# Patient Record
Sex: Male | Born: 1960
Health system: Southern US, Community
[De-identification: ages and names within clinical notes are randomized; demographics above are authoritative.]

## PROBLEM LIST (undated history)

## (undated) DIAGNOSIS — R519 Headache, unspecified: Secondary | ICD-10-CM

## (undated) DIAGNOSIS — T7840XA Allergy, unspecified, initial encounter: Secondary | ICD-10-CM

## (undated) DIAGNOSIS — R319 Hematuria, unspecified: Secondary | ICD-10-CM

## (undated) DIAGNOSIS — M4800 Spinal stenosis, site unspecified: Secondary | ICD-10-CM

## (undated) DIAGNOSIS — F411 Generalized anxiety disorder: Secondary | ICD-10-CM

## (undated) DIAGNOSIS — Z87442 Personal history of urinary calculi: Secondary | ICD-10-CM

## (undated) DIAGNOSIS — N400 Enlarged prostate without lower urinary tract symptoms: Secondary | ICD-10-CM

## (undated) DIAGNOSIS — I10 Essential (primary) hypertension: Secondary | ICD-10-CM

## (undated) DIAGNOSIS — J45909 Unspecified asthma, uncomplicated: Secondary | ICD-10-CM

## (undated) DIAGNOSIS — E785 Hyperlipidemia, unspecified: Secondary | ICD-10-CM

## (undated) DIAGNOSIS — I499 Cardiac arrhythmia, unspecified: Secondary | ICD-10-CM

## (undated) DIAGNOSIS — G43909 Migraine, unspecified, not intractable, without status migrainosus: Secondary | ICD-10-CM

## (undated) DIAGNOSIS — G709 Myoneural disorder, unspecified: Secondary | ICD-10-CM

## (undated) DIAGNOSIS — N401 Enlarged prostate with lower urinary tract symptoms: Secondary | ICD-10-CM

## (undated) DIAGNOSIS — K219 Gastro-esophageal reflux disease without esophagitis: Secondary | ICD-10-CM

## (undated) DIAGNOSIS — J309 Allergic rhinitis, unspecified: Secondary | ICD-10-CM

## (undated) DIAGNOSIS — M1990 Unspecified osteoarthritis, unspecified site: Secondary | ICD-10-CM

## (undated) DIAGNOSIS — U071 COVID-19: Secondary | ICD-10-CM

## (undated) DIAGNOSIS — F419 Anxiety disorder, unspecified: Secondary | ICD-10-CM

## (undated) DIAGNOSIS — R51 Headache: Secondary | ICD-10-CM

## (undated) DIAGNOSIS — M797 Fibromyalgia: Secondary | ICD-10-CM

## (undated) DIAGNOSIS — R351 Nocturia: Secondary | ICD-10-CM

## (undated) HISTORY — DX: Spinal stenosis, site unspecified: M48.00

## (undated) HISTORY — DX: Allergic rhinitis, unspecified: J30.9

## (undated) HISTORY — PX: COLONOSCOPY: SHX174

## (undated) HISTORY — PX: CYSTOSCOPY WITH INSERTION OF UROLIFT: SHX6678

## (undated) HISTORY — PX: SPINE SURGERY: SHX786

## (undated) HISTORY — DX: Generalized anxiety disorder: F41.1

## (undated) HISTORY — DX: Unspecified asthma, uncomplicated: J45.909

## (undated) HISTORY — DX: Cardiac arrhythmia, unspecified: I49.9

## (undated) HISTORY — DX: Migraine, unspecified, not intractable, without status migrainosus: G43.909

## (undated) HISTORY — DX: Allergy, unspecified, initial encounter: T78.40XA

## (undated) HISTORY — PX: BACK SURGERY: SHX140

## (undated) HISTORY — PX: LUMBAR LAMINECTOMY: SHX95

---
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
## 1998-04-19 HISTORY — PX: HERNIA REPAIR: SHX51

## 1998-04-19 HISTORY — PX: APPENDECTOMY: SHX54

## 2003-04-20 HISTORY — PX: OTHER SURGICAL HISTORY: SHX169

## 2004-07-20 ENCOUNTER — Ambulatory Visit: Payer: Self-pay

## 2008-01-15 ENCOUNTER — Ambulatory Visit: Payer: Self-pay | Admitting: Family Medicine

## 2008-01-26 ENCOUNTER — Ambulatory Visit: Payer: Self-pay | Admitting: Urology

## 2008-01-30 ENCOUNTER — Ambulatory Visit: Payer: Self-pay | Admitting: Urology

## 2008-03-13 ENCOUNTER — Ambulatory Visit: Payer: Self-pay | Admitting: Unknown Physician Specialty

## 2016-02-05 ENCOUNTER — Emergency Department
Admission: EM | Admit: 2016-02-05 | Discharge: 2016-02-05 | Disposition: A | Discharge status: Home / Self Care | Payer: BLUE CROSS/BLUE SHIELD | Attending: Emergency Medicine | Admitting: Emergency Medicine

## 2016-02-05 DIAGNOSIS — N390 Urinary tract infection, site not specified: Secondary | ICD-10-CM | POA: Diagnosis not present

## 2016-02-05 DIAGNOSIS — R300 Dysuria: Secondary | ICD-10-CM | POA: Diagnosis present

## 2016-02-05 DIAGNOSIS — Z79899 Other long term (current) drug therapy: Secondary | ICD-10-CM | POA: Diagnosis not present

## 2016-02-05 DIAGNOSIS — R339 Retention of urine, unspecified: Secondary | ICD-10-CM

## 2016-02-05 DIAGNOSIS — N3090 Cystitis, unspecified without hematuria: Secondary | ICD-10-CM

## 2016-02-05 LAB — CBC WITH DIFFERENTIAL/PLATELET
BASOS ABS: 0 10*3/uL (ref 0–0.1)
Basophils Relative: 0 %
EOS PCT: 0 %
Eosinophils Absolute: 0 10*3/uL (ref 0–0.7)
HCT: 41.6 % (ref 40.0–52.0)
Hemoglobin: 14.7 g/dL (ref 13.0–18.0)
LYMPHS PCT: 5 %
Lymphs Abs: 0.6 10*3/uL — ABNORMAL LOW (ref 1.0–3.6)
MCH: 32.2 pg (ref 26.0–34.0)
MCHC: 35.3 g/dL (ref 32.0–36.0)
MCV: 91 fL (ref 80.0–100.0)
MONO ABS: 0.8 10*3/uL (ref 0.2–1.0)
Monocytes Relative: 7 %
Neutro Abs: 11.1 10*3/uL — ABNORMAL HIGH (ref 1.4–6.5)
Neutrophils Relative %: 88 %
PLATELETS: 205 10*3/uL (ref 150–440)
RBC: 4.57 MIL/uL (ref 4.40–5.90)
RDW: 13.8 % (ref 11.5–14.5)
WBC: 12.6 10*3/uL — ABNORMAL HIGH (ref 3.8–10.6)

## 2016-02-05 LAB — BASIC METABOLIC PANEL
ANION GAP: 10 (ref 5–15)
BUN: 11 mg/dL (ref 6–20)
CALCIUM: 9.7 mg/dL (ref 8.9–10.3)
CO2: 24 mmol/L (ref 22–32)
Chloride: 99 mmol/L — ABNORMAL LOW (ref 101–111)
Creatinine, Ser: 0.87 mg/dL (ref 0.61–1.24)
GFR calc Af Amer: >60 mL/min (ref >60)
GLUCOSE: 129 mg/dL — AB (ref 65–99)
Potassium: 3.8 mmol/L (ref 3.5–5.1)
Sodium: 133 mmol/L — ABNORMAL LOW (ref 135–145)

## 2016-02-05 LAB — URINALYSIS COMPLETE WITH MICROSCOPIC (ARMC ONLY)
Bilirubin Urine: NEGATIVE
Glucose, UA: NEGATIVE mg/dL
Hgb urine dipstick: NEGATIVE
Leukocytes, UA: NEGATIVE
Nitrite: POSITIVE — AB
PH: 7 (ref 5.0–8.0)
PROTEIN: 30 mg/dL — AB
SQUAMOUS EPITHELIAL / LPF: NONE SEEN
Specific Gravity, Urine: 1.011 (ref 1.005–1.030)

## 2016-02-05 MED ORDER — TAMSULOSIN HCL 0.4 MG PO CAPS: 0.4000 mg | ORAL_CAPSULE | Freq: Every day | ORAL | 0 refills | Status: DC

## 2016-02-05 MED ORDER — CIPROFLOXACIN HCL 500 MG PO TABS
500.0000 mg | ORAL_TABLET | Freq: Once | ORAL | Status: AC
  Administered 2016-02-05: 500 mg via ORAL
  Filled 2016-02-05: qty 1

## 2016-02-05 MED ORDER — DIAZEPAM 5 MG/ML IJ SOLN
INTRAMUSCULAR | Status: AC
  Administered 2016-02-05: 2 mg via INTRAVENOUS
  Filled 2016-02-05: qty 2

## 2016-02-05 MED ORDER — DIAZEPAM 5 MG/ML IJ SOLN
2.0000 mg | Freq: Once | INTRAMUSCULAR | Status: AC
  Administered 2016-02-05: 2 mg via INTRAVENOUS

## 2016-02-05 MED ORDER — FENTANYL CITRATE (PF) 100 MCG/2ML IJ SOLN
50.0000 ug | INTRAMUSCULAR | Status: DC | PRN
  Administered 2016-02-05: 50 ug via INTRAVENOUS
  Filled 2016-02-05: qty 2

## 2016-02-05 MED ORDER — CIPROFLOXACIN HCL 500 MG PO TABS: 500.0000 mg | ORAL_TABLET | Freq: Two times a day (BID) | ORAL | 0 refills | Status: AC

## 2016-02-05 MED ORDER — HYOSCYAMINE SULFATE 0.125 MG SL SUBL: 0.1250 mg | SUBLINGUAL_TABLET | SUBLINGUAL | 0 refills | Status: DC | PRN

## 2016-02-05 NOTE — ED Provider Notes (Signed)
[facility] Emergency Department Provider Note   ____________________________________________   First MD Initiated Contact with Patient 02/05/16 0701     (approximate)  I have reviewed the triage vital signs and the nursing notes.   HISTORY  Chief Complaint Dysuria   HPI Juan Salas is a 55 y.o. male with a history of an enlarged prostate who is presenting to the emergency department today with urinary retention. He says that he was seen this past Tuesday at an urgent care and diagnosed with urinary tract infection. He was placed on Macrobid as well as Pyridium. He was having burning with urination at that time but over the past day has begun to have urinary retention and says he has not urinated over the past 4-5 hours. He says that yesterday he was having to push very hard to urinate. He says that over the past several hours he has had lower abdominal distention as well as lower abdominal pain. He denies any back pain or rectal pain. Says that he has had prostatitis in the past, years ago.   No past medical history on file.  There are no active problems to display for this patient.   No past surgical history on file.  Prior to Admission medications   Medication Sig Start Date End Date Taking? Authorizing Provider  nitrofurantoin, macrocrystal-monohydrate, (MACROBID) 100 MG capsule Take 100 mg by mouth 2 (two) times daily.    Historical Provider, MD    Allergies Review of patient's allergies indicates no known allergies.  No family history on file.  Social History Social History  Substance Use Topics  . Smoking status: Not on file  . Smokeless tobacco: Not on file  . Alcohol use Not on file    Review of Systems Constitutional: chills Eyes: No visual changes. ENT: No sore throat. Cardiovascular: Denies chest pain. Respiratory: Denies shortness of breath. Gastrointestinal:  No nausea, no vomiting.  no constipation. Genitourinary:  As above Musculoskeletal: Negative for back pain. Skin: Negative for rash. Neurological: Negative for headaches, focal weakness or numbness.  10-point ROS otherwise negative.  ____________________________________________   PHYSICAL EXAM:  VITAL SIGNS: ED Triage Vitals  Enc Vitals Group     BP 02/05/16 0646 (!) 161/98     Pulse Rate 02/05/16 0646 97     Resp 02/05/16 0646 20     Temp 02/05/16 0646 98.2 F (36.8 C)     Temp Source 02/05/16 0646 Oral     SpO2 02/05/16 0646 100 %     Weight 02/05/16 0645 185 lb (83.9 kg)     Height 02/05/16 0645 6\' 1"  (1.854 m)     Head Circumference --      Peak Flow --      Pain Score 02/05/16 0645 10     Pain Loc --      Pain Edu? --      Excl. in [place]? --     Constitutional: Alert and oriented. Well appearing and in no acute distress. Eyes: Conjunctivae are normal. PERRL. EOMI. Head: Atraumatic. Nose: No congestion/rhinnorhea. Mouth/Throat: Mucous membranes are moist.   Neck: No stridor.   Cardiovascular: Normal rate, regular rhythm. Grossly normal heart sounds.   Respiratory: Normal respiratory effort.  No retractions. Lungs CTAB. Gastrointestinal: Soft With mild lower abdominal distention as well as tenderness across the lower abdomen without any focal tenderness to palpation. No CVA tenderness. Rectal exam with a firm and rubbery prostate. No masses palpated. No bogginess or fluctuance. Not tender  to palpation. Musculoskeletal: No lower extremity tenderness nor edema.  No joint effusions. Neurologic:  Normal speech and language. No gross focal neurologic deficits are appreciated.  Skin:  Skin is warm, dry and intact. No rash noted. Psychiatric: Mood and affect are normal. Speech and behavior are normal.  ____________________________________________   LABS (all labs ordered are listed, but only abnormal results are displayed)  Labs Reviewed  CBC WITH DIFFERENTIAL/PLATELET - Abnormal; Notable for the following:       Result Value     WBC 12.6 (*)    Neutro Abs 11.1 (*)    Lymphs Abs 0.6 (*)    All other components within normal limits  BASIC METABOLIC PANEL - Abnormal; Notable for the following:    Sodium 133 (*)    Chloride 99 (*)    Glucose, Bld 129 (*)    All other components within normal limits  URINALYSIS COMPLETEWITH MICROSCOPIC ([facility] ONLY) - Abnormal; Notable for the following:    Color, Urine AMBER (*)    APPearance CLEAR (*)    Ketones, ur 1+ (*)    Protein, ur 30 (*)    Nitrite POSITIVE (*)    Bacteria, UA RARE (*)    All other components within normal limits  URINE CULTURE   ____________________________________________  EKG   ____________________________________________  RADIOLOGY   ____________________________________________   PROCEDURES  Procedure(s) performed:   Procedures  Critical Care performed:   ____________________________________________   INITIAL IMPRESSION / ASSESSMENT AND PLAN / ED COURSE  Pertinent labs & imaging results that were available during my care of the patient were reviewed by me and considered in my medical decision making (see chart for details).    Clinical Course   ----------------------------------------- 9:57 AM on 02/05/2016 -----------------------------------------  Patient with intermittent bladder spasms at this time after Foley placement. Nearly 1 L has drained. Urinalysis is nitrite positive which wasn't on his initial sample at the urgent care. I discussed the case with Dr. Roni Bread of urology who recommends changing antibiotics to Cipro as well as adding Flomax and hyoscyamine.  The patient will remain with a Foley in place. He'll be following up with [facility]. I discussed this plan with patient as well as wife are understanding of this plan went to comply. He will be discharged home. He is also requesting a work note.  ____________________________________________   FINAL CLINICAL IMPRESSION(S) / ED DIAGNOSES  Urinary  retention. UTI with cystitis.    NEW MEDICATIONS STARTED DURING THIS VISIT:  New Prescriptions   No medications on file     Note:  This document was prepared using Dragon voice recognition software and may include unintentional dictation errors.    Orbie Pyo, MD 02/05/16 [phone]

## 2016-02-05 NOTE — ED Triage Notes (Addendum)
Pt co dysuria since Tuesday went to urgent care and dx with cystitis. Has been put on antibiotics and pyridium without improvement. States unable to void since yest.

## 2016-02-05 NOTE — ED Notes (Signed)
Discharge teaching with foley done. Pt and wife verbalized understanding.

## 2016-02-06 LAB — URINE CULTURE

## 2016-02-10 ENCOUNTER — Encounter: Payer: Self-pay | Admitting: Urology

## 2016-02-10 ENCOUNTER — Ambulatory Visit (INDEPENDENT_AMBULATORY_CARE_PROVIDER_SITE_OTHER): Payer: BLUE CROSS/BLUE SHIELD | Admitting: Urology

## 2016-02-10 VITALS — BP 123/85 | HR 87 | Ht 73.0 in | Wt 178.3 lb

## 2016-02-10 DIAGNOSIS — N401 Enlarged prostate with lower urinary tract symptoms: Secondary | ICD-10-CM

## 2016-02-10 DIAGNOSIS — R339 Retention of urine, unspecified: Secondary | ICD-10-CM | POA: Diagnosis not present

## 2016-02-10 MED ORDER — TAMSULOSIN HCL 0.4 MG PO CAPS: 0.4000 mg | ORAL_CAPSULE | Freq: Every day | ORAL | 11 refills | Status: DC

## 2016-02-10 NOTE — Progress Notes (Signed)
02/10/2016 9:29 AM   Juan Salas [birth year] [id number]  Referring provider: No referring provider defined for this encounter.  Chief Complaint  Patient presents with  . New Patient (Initial Visit)    urinary retention     HPI: Juan Salas is a 55yo with a hx of BPH here for evaluation of urinary retention. On 10/19 he presented to [facility] ER with urinary retention and had a foley placed. He was started on flomax at that time.  2 days prior he was seen at an urgent care and diagnosed with UTI and placed on macrobid. Prior to his UTI he had severe LUTS and was not on BPH therapy. He denies constipation.  He previously saw Dr. Jonathon Jordan for BPH and npehrolithiasis.  He denies hematuria. No exacerbating/alleviating events. NO other associated symptoms     PMH: No past medical history on file.  Surgical History: No past surgical history on file.  Home Medications:    Medication List       Accurate as of 02/10/16  9:29 AM. Always use your most recent med list.          acetaminophen 500 MG tablet Commonly known as:  TYLENOL Take 500 mg by mouth every 6 (six) hours as needed.   ciprofloxacin 500 MG tablet Commonly known as:  CIPRO Take 1 tablet (500 mg total) by mouth 2 (two) times daily.   hyoscyamine 0.125 MG SL tablet Commonly known as:  LEVSIN SL Place 1 tablet (0.125 mg total) under the tongue every 4 (four) hours as needed for cramping. Do not take in the 12 hours prior to your urology appointment.   naproxen sodium 220 MG tablet Commonly known as:  ANAPROX Take 220 mg by mouth 2 (two) times daily with a meal.   nitrofurantoin (macrocrystal-monohydrate) 100 MG capsule Commonly known as:  MACROBID Take 100 mg by mouth 2 (two) times daily.   tamsulosin 0.4 MG Caps capsule Commonly known as:  FLOMAX Take 1 capsule (0.4 mg total) by mouth daily.       Allergies: No Known Allergies  Family History: No family history on file.  Social History:   reports that he has never smoked. He has never used smokeless tobacco. He reports that he does not drink alcohol or use drugs.  ROS: UROLOGY Frequent Urination?: Yes Hard to postpone urination?: Yes Burning/pain with urination?: Yes Get up at night to urinate?: Yes Leakage of urine?: Yes Urine stream starts and stops?: Yes Trouble starting stream?: Yes Do you have to strain to urinate?: Yes Blood in urine?: No Urinary tract infection?: Yes Sexually transmitted disease?: No Injury to kidneys or bladder?: No Painful intercourse?: Yes Weak stream?: No Erection problems?: No Penile pain?: No  Gastrointestinal Nausea?: No Vomiting?: No Indigestion/heartburn?: No Diarrhea?: No Constipation?: No  Constitutional Fever: No Night sweats?: No Weight loss?: No Fatigue?: Yes  Skin Skin rash/lesions?: No Itching?: No  Eyes Blurred vision?: No Double vision?: No  Ears/Nose/Throat Sore throat?: No Sinus problems?: Yes  Hematologic/Lymphatic Swollen glands?: No Easy bruising?: No  Cardiovascular Leg swelling?: No Chest pain?: No  Respiratory Cough?: No Shortness of breath?: No  Endocrine Excessive thirst?: No  Musculoskeletal Back pain?: Yes Joint pain?: No  Neurological Headaches?: No Dizziness?: Yes  Psychologic Depression?: No Anxiety?: No  Physical Exam: BP 123/85   Pulse 87   Ht 6\' 1"  (1.854 m)   Wt 80.9 kg (178 lb 4.8 oz)   BMI 23.52 kg/m   Constitutional:  Alert  and oriented, No acute distress. HEENT: [place] AT, moist mucus membranes.  Trachea midline, no masses. Cardiovascular: No clubbing, cyanosis, or edema. Respiratory: Normal respiratory effort, no increased work of breathing. GI: Abdomen is soft, nontender, nondistended, no abdominal masses GU: No CVA tenderness. circumcised phallus. No masses/lesions on penis/testes/scrotum. 16 french foley in place. Prostate 60g smooth, no nodules, no induration Skin: No rashes, bruises or suspicious  lesions. Lymph: No cervical or inguinal adenopathy. Neurologic: Grossly intact, no focal deficits, moving all 4 extremities. Psychiatric: Normal mood and affect.  Laboratory Data: Lab Results  Component Value Date   WBC 12.6 (H) 02/05/2016   HGB 14.7 02/05/2016   HCT 41.6 02/05/2016   MCV 91.0 02/05/2016   PLT 205 02/05/2016    Lab Results  Component Value Date   CREATININE 0.87 02/05/2016    No results found for: PSA  No results found for: TESTOSTERONE  No results found for: HGBA1C  Urinalysis    Component Value Date/Time   COLORURINE AMBER (A) 02/05/2016 0829   APPEARANCEUR CLEAR (A) 02/05/2016 0829   LABSPEC 1.011 02/05/2016 0829   PHURINE 7.0 02/05/2016 0829   GLUCOSEU NEGATIVE 02/05/2016 0829   HGBUR NEGATIVE 02/05/2016 0829   BILIRUBINUR NEGATIVE 02/05/2016 0829   KETONESUR 1+ (A) 02/05/2016 0829   PROTEINUR 30 (A) 02/05/2016 0829   NITRITE POSITIVE (A) 02/05/2016 0829   LEUKOCYTESUR NEGATIVE 02/05/2016 0829    Pertinent Imaging: none  Assessment & Plan:    1. BPH with LUTS, urinary retention -Continue flomax 0.4mg   -voiding trial passed today  There are no diagnoses linked to this encounter.  No Follow-up on file.  Nicolette Bang, MD  [facility] [address] [place], [place] [address] [phone]

## 2016-02-27 ENCOUNTER — Telehealth: Payer: Self-pay

## 2016-02-27 ENCOUNTER — Telehealth: Payer: Self-pay | Admitting: Urology

## 2016-02-27 DIAGNOSIS — N401 Enlarged prostate with lower urinary tract symptoms: Secondary | ICD-10-CM

## 2016-02-27 MED ORDER — TAMSULOSIN HCL 0.4 MG PO CAPS: 0.4000 mg | ORAL_CAPSULE | Freq: Every day | ORAL | 11 refills | Status: DC

## 2016-02-27 NOTE — Telephone Encounter (Signed)
The pt called complaining of urinary frequency, burning that started after he finished his abx. He stated he was urinating every 35-40 minutes. Dr. Alyson Ingles recommended that the pt takes AZO for the discomfort and for him to come in on Monday for a nurse visit to do a  PVR to rule-out urinary retention. He also stated if the patient symptoms worsen he needs to go to the ER. The pt notified and appt scheduled for Monday morning @ 8:30am.

## 2016-02-27 NOTE — Telephone Encounter (Signed)
Patient said that he has taken all of his medication for the infection but now his symptoms have returned. He is burning with urination and heat sensation in the bladder area and frequent urination . He has a follow up with dr. Alyson Ingles on the 21st. He wants to know what he should do?   Sharyn Lull

## 2016-02-27 NOTE — Telephone Encounter (Signed)
The pt called requesting that we send his script to his pharmacy.

## 2016-02-27 NOTE — Telephone Encounter (Signed)
Patient also states that he was supposed to get a script for flomax which I see was printed but the patient says it was not given to him. He said he needs that called into [facility] on [address]  [phone]  He can be reached at this number

## 2016-03-01 ENCOUNTER — Ambulatory Visit (INDEPENDENT_AMBULATORY_CARE_PROVIDER_SITE_OTHER): Payer: BLUE CROSS/BLUE SHIELD

## 2016-03-01 VITALS — BP 174/101 | HR 69 | Ht 73.0 in | Wt 183.4 lb

## 2016-03-01 DIAGNOSIS — R339 Retention of urine, unspecified: Secondary | ICD-10-CM

## 2016-03-01 LAB — MICROSCOPIC EXAMINATION: BACTERIA UA: NONE SEEN

## 2016-03-01 LAB — URINALYSIS, COMPLETE
Bilirubin, UA: NEGATIVE
GLUCOSE, UA: NEGATIVE
KETONES UA: NEGATIVE
NITRITE UA: NEGATIVE
PROTEIN UA: NEGATIVE
RBC, UA: NEGATIVE
SPEC GRAV UA: 1.015 (ref 1.005–1.030)
UUROB: 0.2 mg/dL (ref 0.2–1.0)
pH, UA: 7 (ref 5.0–7.5)

## 2016-03-01 LAB — BLADDER SCAN AMB NON-IMAGING: SCAN RESULT: 40

## 2016-03-01 NOTE — Progress Notes (Signed)
Pt presented today for u/a, ucx, and PVR. Pt stated that he has been burning and having a constant feeling of being full. Pt stated he is currently taking AZO and has a f/u appt next week. Reinforced with pt to keep f/u appt. Clean catch was sent for u/a and cx. PVR-40.   Blood pressure (!) 174/101, pulse 69, height 6\' 1"  (1.854 m), weight 183 lb 6.4 oz (83.2 kg).

## 2016-03-01 NOTE — Addendum Note (Signed)
Addended by: Toniann Fail C on: 03/01/2016 09:12 AM   Modules accepted: Orders

## 2016-03-03 LAB — CULTURE, URINE COMPREHENSIVE

## 2016-03-09 ENCOUNTER — Ambulatory Visit: Payer: BLUE CROSS/BLUE SHIELD | Admitting: Urology

## 2016-03-09 VITALS — Ht 73.0 in | Wt 183.2 lb

## 2016-03-09 DIAGNOSIS — N401 Enlarged prostate with lower urinary tract symptoms: Secondary | ICD-10-CM | POA: Diagnosis not present

## 2016-03-09 DIAGNOSIS — R351 Nocturia: Secondary | ICD-10-CM | POA: Diagnosis not present

## 2016-03-09 NOTE — Progress Notes (Signed)
03/09/2016 2:44 PM   Juan Salas 08-Jan-1961 [id number]  Referring provider: No referring provider defined for this encounter.  Chief Complaint  Patient presents with  . Follow-up    BPH    HPI: Juan Salas is a 55yo with a hx of BPH here for evaluation of urinary retention. On 10/19 he presented to [facility] ER with urinary retention and had a foley placed. He was started on flomax at that time.  2 days prior he was seen at an urgent care and diagnosed with UTI and placed on macrobid. Prior to his UTI he had severe LUTS and was not on BPH therapy. He denies constipation.  He previously saw Dr. Jonathon Salas for BPH and npehrolithiasis.  He denies hematuria. No exacerbating/alleviating events. NO other associated symptoms    Interval hx: He is currently on flomax with resolution of the majority of his LUTS. Nocturia 1x on occasion. 2 weeks ago he had an episode of dysuria and the concern for not emptying. PVR was 42cc at that time. PVR was 0. He is very happy with his LUTS.  He was also having pain with ejaculation prior to starting flomax which has since resolved.    PMH: No past medical history on file.  Surgical History: No past surgical history on file.  Home Medications:    Medication List       Accurate as of 03/09/16  2:44 PM. Always use your most recent med list.          acetaminophen 500 MG tablet Commonly known as:  TYLENOL Take 500 mg by mouth every 6 (six) hours as needed.   guaiFENesin 600 MG 12 hr tablet Commonly known as:  MUCINEX Take 600 mg by mouth 2 (two) times daily.   hyoscyamine 0.125 MG SL tablet Commonly known as:  LEVSIN SL Place 1 tablet (0.125 mg total) under the tongue every 4 (four) hours as needed for cramping. Do not take in the 12 hours prior to your urology appointment.   naproxen sodium 220 MG tablet Commonly known as:  ANAPROX Take 220 mg by mouth 2 (two) times daily with a meal.   tamsulosin 0.4 MG Caps capsule Commonly  known as:  FLOMAX Take 1 capsule (0.4 mg total) by mouth daily.       Allergies: No Known Allergies  Family History: No family history on file.  Social History:  reports that he has never smoked. He has never used smokeless tobacco. He reports that he does not drink alcohol or use drugs.  ROS:                                        Physical Exam: Ht 6\' 1"  (1.854 m)   Wt 83.1 kg (183 lb 3.2 oz)   BMI 24.17 kg/m   Constitutional:  Alert and oriented, No acute distress. HEENT: [place] AT, moist mucus membranes.  Trachea midline, no masses. Cardiovascular: No clubbing, cyanosis, or edema. Respiratory: Normal respiratory effort, no increased work of breathing. GI: Abdomen is soft, nontender, nondistended, no abdominal masses GU: No CVA tenderness.  Skin: No rashes, bruises or suspicious lesions. Lymph: No cervical or inguinal adenopathy. Neurologic: Grossly intact, no focal deficits, moving all 4 extremities. Psychiatric: Normal mood and affect.  Laboratory Data: Lab Results  Component Value Date   WBC 12.6 (H) 02/05/2016   HGB 14.7 02/05/2016   HCT 41.6  02/05/2016   MCV 91.0 02/05/2016   PLT 205 02/05/2016    Lab Results  Component Value Date   CREATININE 0.87 02/05/2016    No results found for: PSA  No results found for: TESTOSTERONE  No results found for: HGBA1C  Urinalysis    Component Value Date/Time   COLORURINE AMBER (A) 02/05/2016 0829   APPEARANCEUR Clear 03/01/2016 0910   LABSPEC 1.011 02/05/2016 0829   PHURINE 7.0 02/05/2016 0829   GLUCOSEU Negative 03/01/2016 0910   HGBUR NEGATIVE 02/05/2016 0829   BILIRUBINUR Negative 03/01/2016 0910   KETONESUR 1+ (A) 02/05/2016 0829   PROTEINUR Negative 03/01/2016 0910   PROTEINUR 30 (A) 02/05/2016 0829   NITRITE Negative 03/01/2016 0910   NITRITE POSITIVE (A) 02/05/2016 0829   LEUKOCYTESUR Trace (A) 03/01/2016 0910    Pertinent Imaging: none  Assessment & Plan:    1. Benign  prostatic hyperplasia with lower urinary tract symptoms, symptom details unspecified -continue flomax 0.4mg  daily - Bladder Scan (Post Void Residual) in office   No Follow-up on file.  Nicolette Bang, MD  [facility] [address] [place], [place] [address] [phone]

## 2016-05-03 ENCOUNTER — Telehealth: Payer: Self-pay | Admitting: Urology

## 2016-05-03 DIAGNOSIS — N401 Enlarged prostate with lower urinary tract symptoms: Secondary | ICD-10-CM

## 2016-05-03 MED ORDER — TAMSULOSIN HCL 0.4 MG PO CAPS: 0.4000 mg | ORAL_CAPSULE | Freq: Every day | ORAL | 4 refills | Status: DC

## 2016-05-03 NOTE — Telephone Encounter (Signed)
Pt states he would like to have Rx (flomax) sent to another pharmacy.  Please advise.

## 2016-06-11 ENCOUNTER — Ambulatory Visit: Payer: BLUE CROSS/BLUE SHIELD

## 2017-08-25 ENCOUNTER — Telehealth: Payer: Self-pay | Admitting: Radiology

## 2017-08-25 DIAGNOSIS — N401 Enlarged prostate with lower urinary tract symptoms: Secondary | ICD-10-CM

## 2017-08-25 MED ORDER — TAMSULOSIN HCL 0.4 MG PO CAPS: 0.4000 mg | ORAL_CAPSULE | Freq: Every day | ORAL | 0 refills | Status: DC

## 2017-08-25 NOTE — Telephone Encounter (Signed)
Pt needs refill of tamsulosin to last until next appt scheduled 08/30/2017. Ran out 1 week ago & feels he is developing prostatitis.

## 2017-08-25 NOTE — Telephone Encounter (Signed)
RX sent

## 2017-08-30 ENCOUNTER — Ambulatory Visit: Payer: BLUE CROSS/BLUE SHIELD | Admitting: Urology

## 2017-08-30 ENCOUNTER — Encounter: Payer: Self-pay | Admitting: Urology

## 2017-08-30 VITALS — BP 165/96 | HR 78 | Resp 16 | Ht 73.0 in | Wt 190.4 lb

## 2017-08-30 DIAGNOSIS — N401 Enlarged prostate with lower urinary tract symptoms: Secondary | ICD-10-CM | POA: Diagnosis not present

## 2017-08-30 LAB — URINALYSIS, COMPLETE
BILIRUBIN UA: NEGATIVE
Glucose, UA: NEGATIVE
Ketones, UA: NEGATIVE
LEUKOCYTES UA: NEGATIVE
Nitrite, UA: NEGATIVE
PH UA: 7 (ref 5.0–7.5)
Protein, UA: NEGATIVE
RBC UA: NEGATIVE
Specific Gravity, UA: 1.02 (ref 1.005–1.030)
UUROB: 0.2 mg/dL (ref 0.2–1.0)

## 2017-08-30 LAB — MICROSCOPIC EXAMINATION
EPITHELIAL CELLS (NON RENAL): NONE SEEN /HPF (ref 0–10)
RBC MICROSCOPIC, UA: NONE SEEN /HPF (ref 0–2)
WBC UA: NONE SEEN /HPF (ref 0–5)

## 2017-08-30 LAB — BLADDER SCAN AMB NON-IMAGING: SCAN RESULT: 0

## 2017-08-31 ENCOUNTER — Encounter: Payer: Self-pay | Admitting: Urology

## 2017-08-31 MED ORDER — TAMSULOSIN HCL 0.4 MG PO CAPS: 0.4000 mg | ORAL_CAPSULE | Freq: Every day | ORAL | 3 refills | Status: DC

## 2017-08-31 NOTE — Progress Notes (Signed)
08/30/2017 7:41 AM   Arty Baumgartner Manfredo May 23, 1960 [id number]  Referring provider: No referring provider defined for this encounter.  Chief Complaint  Patient presents with  . Follow-up   Urologic problem list: -BPH with lower urinary tract symptoms -Previous episode of urinary retention  HPI: 57 year old male presents for follow-up/medication refill.  He last saw Dr. Alyson Ingles and November 2017.  He was treated for urinary retention and a UTI and October 2019.  He had a history of severe lower urinary tract symptoms not on medical management and was started on tamsulosin.  At his last visit PVR by bladder scan was 42 mL.  He states he has been doing well.  He recently ran out of tamsulosin and noted increased hesitancy and decreased urinary stream however once refilled the symptoms have resolved.  He is currently satisfied with his voiding pattern.   PMH: History reviewed. No pertinent past medical history.  Surgical History: History reviewed. No pertinent surgical history.  Home Medications:  Allergies as of 08/30/2017   No Known Allergies     Medication List        Accurate as of 08/30/17 11:59 PM. Always use your most recent med list.          acetaminophen 500 MG tablet Commonly known as:  TYLENOL Take 500 mg by mouth every 6 (six) hours as needed.   guaiFENesin 600 MG 12 hr tablet Commonly known as:  MUCINEX Take 600 mg by mouth 2 (two) times daily.   naproxen sodium 220 MG tablet Commonly known as:  ALEVE Take 220 mg by mouth 2 (two) times daily with a meal.   tamsulosin 0.4 MG Caps capsule Commonly known as:  FLOMAX Take 1 capsule (0.4 mg total) by mouth daily.       Allergies: No Known Allergies  Family History: History reviewed. No pertinent family history.  Social History:  reports that he has never smoked. He has never used smokeless tobacco. He reports that he does not drink alcohol or use drugs.  ROS: UROLOGY Frequent Urination?:  Yes Hard to postpone urination?: No Burning/pain with urination?: No Get up at night to urinate?: No Leakage of urine?: No Urine stream starts and stops?: No Trouble starting stream?: No Do you have to strain to urinate?: No Blood in urine?: No Urinary tract infection?: No Sexually transmitted disease?: No Injury to kidneys or bladder?: No Painful intercourse?: No Weak stream?: No Erection problems?: No Penile pain?: No  Gastrointestinal Nausea?: No Vomiting?: No Indigestion/heartburn?: No Diarrhea?: No Constipation?: No  Constitutional Fever: No Night sweats?: No Weight loss?: No Fatigue?: No  Skin Skin rash/lesions?: No Itching?: No  Eyes Blurred vision?: No Double vision?: No  Ears/Nose/Throat Sore throat?: No Sinus problems?: No  Hematologic/Lymphatic Swollen glands?: No Easy bruising?: No  Cardiovascular Leg swelling?: No Chest pain?: No  Respiratory Cough?: No Shortness of breath?: No  Endocrine Excessive thirst?: No  Musculoskeletal Back pain?: No Joint pain?: No  Neurological Headaches?: No Dizziness?: No  Psychologic Depression?: No Anxiety?: No  Physical Exam: BP (!) 165/96   Pulse 78   Resp 16   Ht 6\' 1"  (1.854 m)   Wt 190 lb 6.4 oz (86.4 kg)   SpO2 98%   BMI 25.12 kg/m   Constitutional:  Alert and oriented, No acute distress. HEENT: [place] AT, moist mucus membranes.  Trachea midline, no masses. Cardiovascular: No clubbing, cyanosis, or edema. Respiratory: Normal respiratory effort, no increased work of breathing. GI: Abdomen is soft, nontender, nondistended,  no abdominal masses GU: No CVA tenderness Lymph: No cervical or inguinal lymphadenopathy. Skin: No rashes, bruises or suspicious lesions. Neurologic: Grossly intact, no focal deficits, moving all 4 extremities. Psychiatric: Normal mood and affect.  Laboratory Data: Lab Results  Component Value Date   WBC 12.6 (H) 02/05/2016   HGB 14.7 02/05/2016   HCT 41.6  02/05/2016   MCV 91.0 02/05/2016   PLT 205 02/05/2016    Lab Results  Component Value Date   CREATININE 0.87 02/05/2016     Assessment & Plan:    1. Benign prostatic hyperplasia with lower urinary tract symptoms, symptom details unspecified 57 year old male with stable lower urinary tract symptoms on tamsulosin.  PVR by bladder scan today was 0 mL.  Tamsulosin was refilled.  I discussed PSA screening and AUA recommendations.  He declined PSA testing at this time.  Continue annual follow-up.   Abbie Sons, [place] [address], [place], [place] [address] [phone]

## 2017-12-18 DIAGNOSIS — N401 Enlarged prostate with lower urinary tract symptoms: Secondary | ICD-10-CM | POA: Insufficient documentation

## 2017-12-18 DIAGNOSIS — I10 Essential (primary) hypertension: Secondary | ICD-10-CM

## 2017-12-18 DIAGNOSIS — N400 Enlarged prostate without lower urinary tract symptoms: Secondary | ICD-10-CM

## 2017-12-18 DIAGNOSIS — R319 Hematuria, unspecified: Secondary | ICD-10-CM

## 2017-12-18 DIAGNOSIS — M1990 Unspecified osteoarthritis, unspecified site: Secondary | ICD-10-CM

## 2017-12-18 DIAGNOSIS — R351 Nocturia: Secondary | ICD-10-CM

## 2017-12-18 HISTORY — DX: Hematuria, unspecified: R31.9

## 2017-12-18 HISTORY — DX: Benign prostatic hyperplasia without lower urinary tract symptoms: N40.0

## 2017-12-18 HISTORY — DX: Nocturia: R35.1

## 2017-12-18 HISTORY — DX: Essential (primary) hypertension: I10

## 2017-12-18 HISTORY — DX: Unspecified osteoarthritis, unspecified site: M19.90

## 2017-12-18 HISTORY — DX: Benign prostatic hyperplasia with lower urinary tract symptoms: N40.1

## 2018-01-09 ENCOUNTER — Ambulatory Visit (INDEPENDENT_AMBULATORY_CARE_PROVIDER_SITE_OTHER): Payer: BLUE CROSS/BLUE SHIELD | Admitting: Urology

## 2018-01-09 ENCOUNTER — Encounter: Payer: Self-pay | Admitting: Urology

## 2018-01-09 VITALS — BP 196/113 | HR 88 | Ht 73.0 in | Wt 189.4 lb

## 2018-01-09 DIAGNOSIS — N401 Enlarged prostate with lower urinary tract symptoms: Secondary | ICD-10-CM | POA: Diagnosis not present

## 2018-01-09 DIAGNOSIS — R339 Retention of urine, unspecified: Secondary | ICD-10-CM | POA: Diagnosis not present

## 2018-01-09 DIAGNOSIS — R351 Nocturia: Secondary | ICD-10-CM

## 2018-01-09 LAB — BLADDER SCAN AMB NON-IMAGING

## 2018-01-09 NOTE — H&P (View-Only) (Signed)
01/09/2018 2:48 PM   Arty Baumgartner Sofranko Jun 21, 1960 [id number]  Referring provider: No referring provider defined for this encounter.  Chief Complaint  Patient presents with  . Benign Prostatic Hypertrophy    HPI: 57 year old male followed for BPH presents today to discuss UroLift. I last saw him May 2019.  PVR at that visit was 48 mL.  He has intermittent periods of worsening lower urinary tract symptoms and for the past 2 weeks has been complaining of sensation of incomplete emptying, frequency, intermittency, weak stream, straining to urinate and nocturia x2.  IPSS completed today was 29/35 with a quality of life rated 6/6.  He denies dysuria or gross hematuria.  He denies flank, abdominal, pelvic or scrotal pain.  PMH: History reviewed. No pertinent past medical history.  Surgical History: History reviewed. No pertinent surgical history.  Home Medications:  Allergies as of 01/09/2018   No Known Allergies     Medication List        Accurate as of 01/09/18  2:48 PM. Always use your most recent med list.          acetaminophen 500 MG tablet Commonly known as:  TYLENOL Take 500 mg by mouth every 6 (six) hours as needed.   guaiFENesin 600 MG 12 hr tablet Commonly known as:  MUCINEX Take 600 mg by mouth 2 (two) times daily.   montelukast 10 MG tablet Commonly known as:  SINGULAIR Take 10 mg by mouth every evening.   naproxen sodium 220 MG tablet Commonly known as:  ALEVE Take 220 mg by mouth 2 (two) times daily with a meal.   tamsulosin 0.4 MG Caps capsule Commonly known as:  FLOMAX Take 1 capsule (0.4 mg total) by mouth daily.       Allergies: No Known Allergies  Family History: History reviewed. No pertinent family history.  Social History:  reports that he has never smoked. He has never used smokeless tobacco. He reports that he does not drink alcohol or use drugs.  ROS: UROLOGY Frequent Urination?: Yes Hard to postpone urination?:  No Burning/pain with urination?: Yes Get up at night to urinate?: Yes Leakage of urine?: Yes Urine stream starts and stops?: Yes Trouble starting stream?: Yes Do you have to strain to urinate?: Yes Blood in urine?: No Urinary tract infection?: No Sexually transmitted disease?: No Injury to kidneys or bladder?: No Painful intercourse?: Yes Weak stream?: Yes Erection problems?: No Penile pain?: No  Gastrointestinal Nausea?: Yes Vomiting?: No Indigestion/heartburn?: No Diarrhea?: No Constipation?: No  Constitutional Fever: No Night sweats?: No Weight loss?: No Fatigue?: Yes  Skin Skin rash/lesions?: No Itching?: No  Eyes Blurred vision?: No Double vision?: No  Ears/Nose/Throat Sore throat?: No Sinus problems?: Yes  Hematologic/Lymphatic Swollen glands?: No Easy bruising?: No  Cardiovascular Leg swelling?: No Chest pain?: No  Respiratory Cough?: No Shortness of breath?: No  Endocrine Excessive thirst?: No  Musculoskeletal Back pain?: Yes Joint pain?: No  Neurological Headaches?: No Dizziness?: No  Psychologic Depression?: No Anxiety?: No  Physical Exam: BP (!) 196/113 (BP Location: Left Arm, Patient Position: Sitting, Cuff Size: Large)   Pulse 88   Ht 6\' 1"  (1.854 m)   Wt 189 lb 6.4 oz (85.9 kg)   BMI 24.99 kg/m   Constitutional:  Alert and oriented, No acute distress. HEENT: [place] AT, moist mucus membranes.  Trachea midline, no masses. Cardiovascular: No clubbing, cyanosis, or edema. Respiratory: Normal respiratory effort, no increased work of breathing. GI: Abdomen is soft, nontender, nondistended, no abdominal masses  GU: No CVA tenderness.  Prostate 50 g, smooth without nodules Lymph: No cervical or inguinal lymphadenopathy. Skin: No rashes, bruises or suspicious lesions. Neurologic: Grossly intact, no focal deficits, moving all 4 extremities. Psychiatric: Normal mood and affect.   Assessment & Plan:   57 year old male with BPH and  lower urinary tract symptoms which are periodically worse at times.  Urinalysis was ordered.  PVR by bladder scan today was 148 mL.  We discussed the UroLift procedure in detail including the most common side effects of urinary frequency, urgency, dysuria and intermittent hematuria.  The low risks of sexual side effects were discussed.  The possibility of persistent lower urinary tract symptoms was also discussed.  He would like to proceed with further evaluation and will schedule cystoscopy/TRUS to see if he is a candidate for the procedure.  He will need a PSA however will await on his UA results prior to ordering.   Abbie Sons, [place] [address], [place], [place] [address] [phone]

## 2018-01-09 NOTE — Progress Notes (Signed)
01/09/2018 2:48 PM   Juan Salas 10-29-1960 [id number]  Referring provider: No referring provider defined for this encounter.  Chief Complaint  Patient presents with  . Benign Prostatic Hypertrophy    HPI: 57 year old male followed for BPH presents today to discuss UroLift. I last saw him May 2019.  PVR at that visit was 48 mL.  He has intermittent periods of worsening lower urinary tract symptoms and for the past 2 weeks has been complaining of sensation of incomplete emptying, frequency, intermittency, weak stream, straining to urinate and nocturia x2.  IPSS completed today was 29/35 with a quality of life rated 6/6.  He denies dysuria or gross hematuria.  He denies flank, abdominal, pelvic or scrotal pain.  PMH: History reviewed. No pertinent past medical history.  Surgical History: History reviewed. No pertinent surgical history.  Home Medications:  Allergies as of 01/09/2018   No Known Allergies     Medication List        Accurate as of 01/09/18  2:48 PM. Always use your most recent med list.          acetaminophen 500 MG tablet Commonly known as:  TYLENOL Take 500 mg by mouth every 6 (six) hours as needed.   guaiFENesin 600 MG 12 hr tablet Commonly known as:  MUCINEX Take 600 mg by mouth 2 (two) times daily.   montelukast 10 MG tablet Commonly known as:  SINGULAIR Take 10 mg by mouth every evening.   naproxen sodium 220 MG tablet Commonly known as:  ALEVE Take 220 mg by mouth 2 (two) times daily with a meal.   tamsulosin 0.4 MG Caps capsule Commonly known as:  FLOMAX Take 1 capsule (0.4 mg total) by mouth daily.       Allergies: No Known Allergies  Family History: History reviewed. No pertinent family history.  Social History:  reports that he has never smoked. He has never used smokeless tobacco. He reports that he does not drink alcohol or use drugs.  ROS: UROLOGY Frequent Urination?: Yes Hard to postpone urination?:  No Burning/pain with urination?: Yes Get up at night to urinate?: Yes Leakage of urine?: Yes Urine stream starts and stops?: Yes Trouble starting stream?: Yes Do you have to strain to urinate?: Yes Blood in urine?: No Urinary tract infection?: No Sexually transmitted disease?: No Injury to kidneys or bladder?: No Painful intercourse?: Yes Weak stream?: Yes Erection problems?: No Penile pain?: No  Gastrointestinal Nausea?: Yes Vomiting?: No Indigestion/heartburn?: No Diarrhea?: No Constipation?: No  Constitutional Fever: No Night sweats?: No Weight loss?: No Fatigue?: Yes  Skin Skin rash/lesions?: No Itching?: No  Eyes Blurred vision?: No Double vision?: No  Ears/Nose/Throat Sore throat?: No Sinus problems?: Yes  Hematologic/Lymphatic Swollen glands?: No Easy bruising?: No  Cardiovascular Leg swelling?: No Chest pain?: No  Respiratory Cough?: No Shortness of breath?: No  Endocrine Excessive thirst?: No  Musculoskeletal Back pain?: Yes Joint pain?: No  Neurological Headaches?: No Dizziness?: No  Psychologic Depression?: No Anxiety?: No  Physical Exam: BP (!) 196/113 (BP Location: Left Arm, Patient Position: Sitting, Cuff Size: Large)   Pulse 88   Ht 6\' 1"  (1.854 m)   Wt 189 lb 6.4 oz (85.9 kg)   BMI 24.99 kg/m   Constitutional:  Alert and oriented, No acute distress. HEENT: [place] AT, moist mucus membranes.  Trachea midline, no masses. Cardiovascular: No clubbing, cyanosis, or edema. Respiratory: Normal respiratory effort, no increased work of breathing. GI: Abdomen is soft, nontender, nondistended, no abdominal masses  GU: No CVA tenderness.  Prostate 50 g, smooth without nodules Lymph: No cervical or inguinal lymphadenopathy. Skin: No rashes, bruises or suspicious lesions. Neurologic: Grossly intact, no focal deficits, moving all 4 extremities. Psychiatric: Normal mood and affect.   Assessment & Plan:   57 year old male with BPH and  lower urinary tract symptoms which are periodically worse at times.  Urinalysis was ordered.  PVR by bladder scan today was 148 mL.  We discussed the UroLift procedure in detail including the most common side effects of urinary frequency, urgency, dysuria and intermittent hematuria.  The low risks of sexual side effects were discussed.  The possibility of persistent lower urinary tract symptoms was also discussed.  He would like to proceed with further evaluation and will schedule cystoscopy/TRUS to see if he is a candidate for the procedure.  He will need a PSA however will await on his UA results prior to ordering.   Abbie Sons, [facility] [address], [place], [place] [address] [phone]

## 2018-01-10 LAB — URINALYSIS, COMPLETE
Bilirubin, UA: NEGATIVE
GLUCOSE, UA: NEGATIVE
Ketones, UA: NEGATIVE
LEUKOCYTES UA: NEGATIVE
Nitrite, UA: NEGATIVE
PROTEIN UA: NEGATIVE
RBC, UA: NEGATIVE
Specific Gravity, UA: 1.02 (ref 1.005–1.030)
UUROB: 0.2 mg/dL (ref 0.2–1.0)
pH, UA: 7 (ref 5.0–7.5)

## 2018-01-10 LAB — MICROSCOPIC EXAMINATION
Bacteria, UA: NONE SEEN
Epithelial Cells (non renal): NONE SEEN /hpf (ref 0–10)
RBC, UA: NONE SEEN /hpf (ref 0–2)

## 2018-01-12 ENCOUNTER — Telehealth: Payer: Self-pay

## 2018-01-12 ENCOUNTER — Ambulatory Visit (INDEPENDENT_AMBULATORY_CARE_PROVIDER_SITE_OTHER): Payer: BLUE CROSS/BLUE SHIELD | Admitting: Urology

## 2018-01-12 ENCOUNTER — Encounter: Payer: Self-pay | Admitting: Urology

## 2018-01-12 VITALS — BP 163/94 | HR 94 | Ht 73.0 in | Wt 188.6 lb

## 2018-01-12 DIAGNOSIS — R351 Nocturia: Secondary | ICD-10-CM

## 2018-01-12 DIAGNOSIS — N401 Enlarged prostate with lower urinary tract symptoms: Secondary | ICD-10-CM

## 2018-01-12 DIAGNOSIS — R339 Retention of urine, unspecified: Secondary | ICD-10-CM

## 2018-01-12 LAB — URINALYSIS, COMPLETE
BILIRUBIN UA: NEGATIVE
Glucose, UA: NEGATIVE
Ketones, UA: NEGATIVE
Leukocytes, UA: NEGATIVE
NITRITE UA: NEGATIVE
PH UA: 7 (ref 5.0–7.5)
Protein, UA: NEGATIVE
RBC UA: NEGATIVE
SPEC GRAV UA: 1.015 (ref 1.005–1.030)
UUROB: 0.2 mg/dL (ref 0.2–1.0)

## 2018-01-12 MED ORDER — LIDOCAINE HCL URETHRAL/MUCOSAL 2 % EX GEL: 1.0000 "application " | Freq: Once | CUTANEOUS | Status: DC

## 2018-01-12 NOTE — Progress Notes (Signed)
   01/12/18  CC:  Chief Complaint  Patient presents with  . Cysto    HPI: 57 year old male with severe lower urinary tract symptoms interested in UroLift.  Blood pressure (!) 163/94, pulse 94, height 6\' 1"  (1.854 m), weight 188 lb 9.6 oz (85.5 kg). NED. A&Ox3.     Cystoscopy Procedure Note  Patient identification was confirmed, informed consent was obtained, and patient was prepped using Betadine solution.  Lidocaine jelly was administered per urethral meatus.    Preoperative abx where received prior to procedure.     Pre-Procedure: - Inspection reveals a normal caliber ureteral meatus.  Procedure: The flexible cystoscope was introduced without difficulty - No urethral strictures/lesions are present. - Mild to moderate lateral lobe enlargement prostate; no median lobe - Mild elevation bladder neck - Bilateral ureteral orifices identified - Bladder mucosa  reveals no ulcers, tumors, or lesions - No bladder stones - No trabeculation  Retroflexion shows no intravesical median lobe   Post-Procedure: - Patient tolerated the procedure well  Assessment/ Plan: 57 year old male with severe lower urinary tract symptoms.  Based on his ultrasound and cystoscopy he is a candidate for UroLift.  He desires to schedule.  The procedure was discussed in detail.  The most common side effects of frequency, urgency and dysuria were discussed.  He was informed there is no guarantee this procedure will improve or resolve his lower urinary tract symptoms.  He was informed that there are minimal sexual side effects including retrograde ejaculation.  He indicated all questions were answered and desires to schedule.  He will need a PSA prior to the procedure.   John Giovanni, MD

## 2018-01-12 NOTE — Telephone Encounter (Signed)
Called patient and left vmail to call back concerning scheduling Urolift surgery on 01-17-18

## 2018-01-12 NOTE — Telephone Encounter (Signed)
Spoke with patient and notified him that his surgery for Urolift with Dr. Bernardo Heater has been scheduled for 01-17-18. Patient is to come to the office on Monday for a PSA check , lab apt made and order placed

## 2018-01-12 NOTE — Progress Notes (Signed)
Transrectal ultrasound prostate  Indications: Volume determination for UroLift  Description: A 7.5 MHz transrectal ultrasound probe was lubricated and gently inserted per rectum.  The prostate was imaged in both transverse and sagittal planes.  The prostate volume was calculated at 31 g.  Scattered prostatic calculi in the transition zone.  No echogenic abnormalities of the peripheral zone were noted.  No intravesical median lobe identified.  Measurements as follows:  Height 28 mm Width 45 mm  Length 46 mm  John Giovanni, MD

## 2018-01-13 ENCOUNTER — Encounter
Admission: RE | Admit: 2018-01-13 | Discharge: 2018-01-13 | Disposition: A | Discharge status: Home / Self Care | Payer: BLUE CROSS/BLUE SHIELD | Source: Ambulatory Visit | Attending: Urology | Admitting: Urology

## 2018-01-13 ENCOUNTER — Other Ambulatory Visit: Payer: Self-pay

## 2018-01-13 HISTORY — DX: Myoneural disorder, unspecified: G70.9

## 2018-01-13 HISTORY — DX: Nocturia: R35.1

## 2018-01-13 HISTORY — DX: Hematuria, unspecified: R31.9

## 2018-01-13 HISTORY — DX: Essential (primary) hypertension: I10

## 2018-01-13 HISTORY — DX: Unspecified osteoarthritis, unspecified site: M19.90

## 2018-01-13 HISTORY — DX: Personal history of urinary calculi: Z87.442

## 2018-01-13 HISTORY — DX: Benign prostatic hyperplasia without lower urinary tract symptoms: N40.0

## 2018-01-13 HISTORY — DX: Benign prostatic hyperplasia with lower urinary tract symptoms: N40.1

## 2018-01-13 HISTORY — DX: Anxiety disorder, unspecified: F41.9

## 2018-01-13 HISTORY — DX: Headache: R51

## 2018-01-13 HISTORY — DX: Headache, unspecified: R51.9

## 2018-01-13 NOTE — Patient Instructions (Signed)
Your procedure is scheduled on: Tuesday, October 1st  Report to [facility]  To find out your arrival time please call [phone] between 1PM - 3PM on Monday, January 16, 2018  Remember: Instructions that are not followed completely may result in serious medical risk,  up to and including death, or upon the discretion of your surgeon and anesthesiologist your  surgery may need to be rescheduled.     _X__ 1. Do not eat food after midnight the night before your procedure.                 No gum chewing or hard candies.                   You may drink clear liquids up to 2 hours before you are scheduled to arrive for your surgery-                   DO not drink clear liquids within 2 hours of the start of your surgery.                  Clear Liquids include:  water, apple juice without pulp, clear carbohydrate                 drink such as Clearfast of Gatorade, Black Coffee or Tea (Do not add                 anything to coffee or tea).  __X__2.  On the morning of surgery brush your teeth with toothpaste and water,                 You may rinse your mouth with mouthwash if you wish.                    Do not swallow any toothpaste of mouthwash.     _X__ 3.  No Alcohol for 24 hours before or after surgery.   _X__ 4.  Do Not Smoke or use e-cigarettes For 24 Hours Prior to Your Surgery.                 Do not use any chewable tobacco products for at least 6 hours prior to                 surgery.  ____  5.  Bring all medications with you on the day of surgery if instructed.   ____  6.  Notify your doctor if there is any change in your medical condition      (cold, fever, infections).     Do not wear jewelry, make-up, hairpins, clips or nail polish. Do not wear lotions, powders, or perfumes. You may wear deodorant. Do not shave 48 hours prior to surgery. Men may shave face and neck. Do not bring valuables to the hospital.    [facility] is not responsible for any belongings or valuables.  Contacts, dentures or bridgework may not be worn into surgery. Leave your suitcase in the car. After surgery it may be brought to your room. For patients admitted to the hospital, discharge time is determined by your treatment team.   Patients discharged the day of surgery will not be allowed to drive home.   Please read over the following fact sheets that you were given:   PREPARING FOR SURGERY    ____ Take these medicines the morning of surgery with A SIP OF WATER:  1. AFRIN IF YOU NEED TO TAKE IT.  2.   3.   4.  5.  6.  ____ Fleet Enema (as directed)   __X__ Use ANTIBACTERIAL Soap as directed. PLEASE SHOWER EITHER THE NIGHT BEFORE OR THE MORNING OF SURGERY  _X___ Stop ALL ASPIRIN PRODUCTS AS OF TODAY            __X__ Stop Anti-inflammatories AS OF TODAY               THIS INCLUDES IBUPROFEN / MOTRIN / ADVIL / ALEVE / NAPROSYN / NAPROXEN   __X__ Stop supplements until after surgery.                THIS INCLUDES L-THEANINE / MULTIVITS / FISH OIL / SAW PALMETTO / TURMERIC / MAGNESIUM  ____ Bring C-Pap to the hospital.   YOU MAY NOT WEAR YOUR CONTACTS INTO SURGERY BUT YOU CAN WEAR THEM TO THE HOSPITAL AS LONG          AS THEY ARE REMOVED PRIOR TO GOING INTO THE OPERATING ROOM  STOOL SOFTENERS IF YOU FIND YOU ARE TAKING NARCOTIC PAIN MEDS OR IF FEELING CONSTIPATED.  [place].

## 2018-01-16 ENCOUNTER — Encounter: Payer: Self-pay | Admitting: Family Medicine

## 2018-01-16 ENCOUNTER — Other Ambulatory Visit: Payer: BLUE CROSS/BLUE SHIELD

## 2018-01-16 ENCOUNTER — Ambulatory Visit (INDEPENDENT_AMBULATORY_CARE_PROVIDER_SITE_OTHER): Payer: BLUE CROSS/BLUE SHIELD | Admitting: Family Medicine

## 2018-01-16 VITALS — BP 162/108 | HR 66 | Temp 98.2°F | Ht 73.0 in | Wt 190.0 lb

## 2018-01-16 DIAGNOSIS — N401 Enlarged prostate with lower urinary tract symptoms: Secondary | ICD-10-CM

## 2018-01-16 DIAGNOSIS — Z87442 Personal history of urinary calculi: Secondary | ICD-10-CM | POA: Diagnosis not present

## 2018-01-16 DIAGNOSIS — Z9889 Other specified postprocedural states: Secondary | ICD-10-CM | POA: Diagnosis not present

## 2018-01-16 DIAGNOSIS — Z0000 Encounter for general adult medical examination without abnormal findings: Secondary | ICD-10-CM

## 2018-01-16 DIAGNOSIS — L219 Seborrheic dermatitis, unspecified: Secondary | ICD-10-CM

## 2018-01-16 DIAGNOSIS — R030 Elevated blood-pressure reading, without diagnosis of hypertension: Secondary | ICD-10-CM

## 2018-01-16 DIAGNOSIS — R351 Nocturia: Secondary | ICD-10-CM

## 2018-01-16 MED ORDER — KETOCONAZOLE 2 % EX SHAM: 1.0000 "application " | MEDICATED_SHAMPOO | CUTANEOUS | 1 refills | Status: DC

## 2018-01-16 NOTE — Progress Notes (Signed)
Patient: Juan Salas, Male    DOB: 12/10/1960, 57 y.o.   MRN: [id number] Visit Date: 01/16/2018  Today's Provider: Vernie Murders, PA   Chief Complaint  Patient presents with  . Annual Exam  . Fatigue    Recent tick bites.    Subjective:    Annual physical exam Juan Salas is a 57 y.o. male who presents today for health maintenance and complete physical. He feels fairly well. He reports exercising regularly. He reports he is sleeping fairly well.  -----------------------------------------------------------------   Review of Systems  Constitutional: Positive for fatigue. Negative for activity change, appetite change, chills, diaphoresis, fever and unexpected weight change.  HENT: Negative.   Eyes: Negative.   Respiratory: Negative.   Cardiovascular: Negative.   Musculoskeletal: Positive for back pain. Negative for arthralgias, gait problem, joint swelling, myalgias, neck pain and neck stiffness.  Skin: Negative.   Allergic/Immunologic: Negative.   Neurological: Negative.   Hematological: Negative.   Psychiatric/Behavioral: Negative.     Social History      He  reports that he has never smoked. He has never used smokeless tobacco. He reports that he does not drink alcohol or use drugs.       Social History   Socioeconomic History  . Marital status: Married    Spouse name: Juan Salas  . Number of children: 2  . Years of education: Not on file  . Highest education level: Not on file  Occupational History  . Occupation: Management consultant: [employer]  Social Needs  . Financial resource strain: Not on file  . Food insecurity:    Worry: Not on file    Inability: Not on file  . Transportation needs:    Medical: Not on file    Non-medical: Not on file  Tobacco Use  . Smoking status: Never Smoker  . Smokeless tobacco: Never Used  Substance and Sexual Activity  . Alcohol use: No  . Drug use: No  . Sexual activity: Yes  Lifestyle  .  Physical activity:    Days per week: Not on file    Minutes per session: Not on file  . Stress: Very much  Relationships  . Social connections:    Talks on phone: Not on file    Gets together: Not on file    Attends religious service: Not on file    Active member of club or organization: Not on file    Attends meetings of clubs or organizations: Not on file    Relationship status: Not on file  Other Topics Concern  . Not on file  Social History Narrative   AGING AND UNHEALTHY IN-LAWS LIVE WITH PATIENT AND WIFE. CURRENTLY ONE IS IN HOSPITAL AND IS VERY SICK. THEY TAKE A LOT OF WORK.    Past Medical History:  Diagnosis Date  . Anxiety   . Arthritis 12/2017   spinal stenosis  . BPH (benign prostatic hyperplasia) 12/2017  . Headache    monthly migraines often d/t sinus and allergy problems. Naproxen is treatment  . Hematuria 12/2017  . History of kidney stones 1977, 1983,2000   passed on his own. 7 different cycles of stones during youth.  . Hypertension 12/2017   not diagnosed but bp elevated recently d/t stress & pain.  Marland Kitchen Neuromuscular disorder (HCC)    fibromyalgia. diagnosed in 2000  . Nocturia associated with benign prostatic hyperplasia 12/2017   Patient Active Problem List   Diagnosis Date Noted  .  Nocturia 03/09/2016  . Benign prostatic hyperplasia with lower urinary tract symptoms 02/10/2016  . Urinary retention 02/10/2016   Past Surgical History:  Procedure Laterality Date  . APPENDECTOMY  2000  . [place]   laminectomy x 2.   . COLONOSCOPY    . HERNIA REPAIR Left 2000   double inguinal hernia repairs  . NASAL SEPTUM SURGERY  1978   repair of sinuses also  . TESTICULAR TORSION REPAIR  2005  . TONSILLECTOMY  1970   Family History        Family Status  Relation Name Status  . Mother  Alive  . Father  Alive        His family history includes Hypertension in his father and mother; Prostatitis in his father.     Allergies  Allergen  Reactions  . Seasonal Ic [Cholestatin] Other (See Comments)    Sinus issues    Current Outpatient Medications:  .  acetaminophen (TYLENOL) 500 MG tablet, Take 500 mg by mouth every 4 (four) hours as needed for moderate pain or headache. , Disp: , Rfl:  .  calcium carbonate (TUMS - DOSED IN MG ELEMENTAL CALCIUM) 500 MG chewable tablet, Chew 1 tablet by mouth 2 (two) times daily as needed for indigestion or heartburn., Disp: , Rfl:  .  chlorpheniramine (CHLOR-TRIMETON) 4 MG tablet, Take 2 mg by mouth 2 (two) times daily as needed for allergies., Disp: , Rfl:  .  Cholecalciferol (VITAMIN D3) 5000 units CAPS, Take 5,000 Units by mouth daily., Disp: , Rfl:  .  cyanocobalamin 2000 MCG tablet, Take 2,000 mcg by mouth daily., Disp: , Rfl:  .  diphenhydrAMINE (BENADRYL) 25 MG tablet, Take 25 mg by mouth at bedtime as needed for sleep., Disp: , Rfl:  .  Green Tea, Camellia sinensis, (GREEN TEA EXTRACT PO), Take by mouth. Uses this in his tea bags periodically, Disp: , Rfl:  .  L-THEANINE PO, Take 200 mg by mouth daily as needed (stress)., Disp: , Rfl:  .  Magnesium 250 MG TABS, Take 250 mg by mouth at bedtime., Disp: , Rfl:  .  montelukast (SINGULAIR) 10 MG tablet, Take 10 mg by mouth every evening., Disp: , Rfl: 12 .  Multiple Vitamin (MULTIVITAMIN WITH MINERALS) TABS tablet, Take 1 tablet by mouth daily., Disp: , Rfl:  .  naproxen (NAPROSYN) 250 MG tablet, Take 250 mg by mouth as needed., Disp: , Rfl:  .  Omega-3 Fatty Acids (FISH OIL) 1000 MG CAPS, Take 1,000 mg by mouth 2 (two) times daily., Disp: , Rfl:  .  oxymetazoline (AFRIN) 0.05 % nasal spray, Place 1 spray into both nostrils 2 (two) times daily as needed for congestion., Disp: , Rfl:  .  Saw Palmetto, Serenoa repens, 320 MG CAPS, Take 320 mg by mouth at bedtime., Disp: , Rfl:  .  tamsulosin (FLOMAX) 0.4 MG CAPS capsule, Take 1 capsule (0.4 mg total) by mouth daily. (Patient taking differently: Take 0.4 mg by mouth every evening. ), Disp: 90  capsule, Rfl: 3 .  Turmeric 500 MG CAPS, Take 500 mg by mouth daily., Disp: , Rfl:   Current Facility-Administered Medications:  .  lidocaine (XYLOCAINE) 2 % jelly 1 application, 1 application, Urethral, Once, Stoioff, Ronda Fairly, MD   Patient Care Team: Ensley Blas, Vickki Muff, PA as PCP - General (Family Medicine)      Objective:   Vitals: BP (!) 162/108 (BP Location: Right Arm, Patient Position: Sitting, Cuff Size: Normal)   Pulse  66   Temp 98.2 F (36.8 C) (Oral)   Ht 6\' 1"  (1.854 m)   Wt 190 lb (86.2 kg)   SpO2 97%   BMI 25.07 kg/m    Vitals:   01/16/18 1336  BP: (!) 162/108  Pulse: 66  Temp: 98.2 F (36.8 C)  TempSrc: Oral  SpO2: 97%  Weight: 190 lb (86.2 kg)  Height: 6\' 1"  (1.854 m)    Physical Exam  Constitutional: He is oriented to person, place, and time. He appears well-developed and well-nourished.  HENT:  Head: Normocephalic and atraumatic.  Right Ear: External ear normal.  Left Ear: External ear normal.  Nose: Nose normal.  Mouth/Throat: Oropharynx is clear and moist.  Eyes: Pupils are equal, round, and reactive to light. Conjunctivae and EOM are normal. Right eye exhibits no discharge.  Neck: Normal range of motion. Neck supple. No tracheal deviation present. No thyromegaly present.  Cardiovascular: Normal rate, regular rhythm, normal heart sounds and intact distal pulses.  No murmur heard. Pulmonary/Chest: Effort normal and breath sounds normal. No respiratory distress. He has no wheezes. He has no rales. He exhibits no tenderness.  Abdominal: Soft. Bowel sounds are normal. He exhibits no distension and no mass. There is no tenderness. There is no rebound and no guarding.  Musculoskeletal: Normal range of motion. He exhibits no edema or tenderness.  Lymphadenopathy:    He has no cervical adenopathy.  Neurological: He is alert and oriented to person, place, and time. He has normal reflexes. He displays normal reflexes. No cranial nerve deficit. He exhibits  normal muscle tone. Coordination normal.  Skin: Skin is warm and dry. No erythema.  Pin point pruritic scabbed lesion on scalp with some occasional flaking.  Psychiatric: He has a normal mood and affect. His behavior is normal. Judgment and thought content normal.    Depression Screen PHQ 2/9 Scores 01/17/2018  PHQ - 2 Score 0    Assessment & Plan:     Routine Health Maintenance and Physical Exam  Exercise Activities and Dietary recommendations Goals   Having difficulty maintaining exercise levels with prostate disease causing urinary obstruction symptoms.     There is no immunization history on file for this patient.  Health Maintenance  Topic Date Due  . Hepatitis C Screening  [birth year]  . HIV Screening  08/08/1974  . TETANUS/TDAP  08/08/1978  . COLONOSCOPY  08/07/2009  . INFLUENZA VACCINE  11/17/2017     Discussed health benefits of physical activity, and encouraged him to engage in regular exercise appropriate for his age and condition.    -------------------------------------------------------------------- 1. Annual physical exam General health stable. BP elevation suspected from severe BPH causing urinary obstruction. Some itchy scalp from suspected seborrhea. Will treat with Nizoral. Check routine labs and follow up pending reports. - ketoconazole (NIZORAL) 2 % shampoo; Apply 1 application topically 2 (two) times a week.  Dispense: 120 mL; Refill: 1 - CBC with Differential/Platelet - Comprehensive metabolic panel - Lipid panel - TSH  2. Benign prostatic hyperplasia with lower urinary tract symptoms, symptom details unspecified Significant LUTS from BPH with history of retention requiring catheter in October 2018. BP very high from obstruction. Check CBC and CMP. Proceed with Uro-Lift procedure. Follow up pending reports. - CBC with Differential/Platelet - Comprehensive metabolic panel  3. Hx of decompressive lumbar laminectomy Lumbar laminectomy for HNP in  1990 and 1994. Scar well healed and no significant back pain. Some stiffness but no functional limitations.  4. History of kidney stones Had stones  years ago. No recent hematuria. Slight amount seen when he went into urinary retention from BPH and required a catheter In October 2018. No flank pain today. Urinalysis on 01-12-18 was clear of crystals, RBC's or signs of infection. - CBC with Differential/Platelet  5. Seborrheic dermatitis of scalp Has tried [employer] and [employer] Should[employer] shampoos with short term relief. Will switch to Nizoral. May need referral to dermatologist if no better. Denies insect bites and no nits found. - ketoconazole (NIZORAL) 2 % shampoo; Apply 1 application topically 2 (two) times a week.  Dispense: 120 mL; Refill: 1  6. Elevated blood pressure reading BP very elevated today. Having more bladder discomfort similar to past LUTS with retention. Scheduled for new prostate lift procedure with Dr. Bernardo Heater (urologist). Recheck routine labs. Pending lab reports, should recheck BP after procedure. - CBC with Differential/Platelet - Comprehensive metabolic panel - Lipid panel - TSH  7. Nocturia Get up to urinate 3-5 times a night due to suspected BPH. To get labs with PSA today in follow up with Dr. Bernardo Heater (urologist).    Vernie Murders, PA  [facility]

## 2018-01-17 ENCOUNTER — Encounter
Admission: RE | Disposition: A | Discharge status: Home / Self Care | Payer: Self-pay | Source: Ambulatory Visit | Attending: Urology

## 2018-01-17 ENCOUNTER — Telehealth: Payer: Self-pay | Admitting: Urology

## 2018-01-17 ENCOUNTER — Ambulatory Visit: Payer: BLUE CROSS/BLUE SHIELD | Admitting: Anesthesiology

## 2018-01-17 ENCOUNTER — Ambulatory Visit
Admission: RE | Admit: 2018-01-17 | Discharge: 2018-01-17 | Disposition: A | Discharge status: Home / Self Care | Payer: BLUE CROSS/BLUE SHIELD | Source: Ambulatory Visit | Attending: Urology | Admitting: Urology

## 2018-01-17 ENCOUNTER — Other Ambulatory Visit: Payer: Self-pay

## 2018-01-17 ENCOUNTER — Encounter: Payer: Self-pay | Admitting: *Deleted

## 2018-01-17 DIAGNOSIS — G709 Myoneural disorder, unspecified: Secondary | ICD-10-CM | POA: Diagnosis not present

## 2018-01-17 DIAGNOSIS — N401 Enlarged prostate with lower urinary tract symptoms: Secondary | ICD-10-CM

## 2018-01-17 DIAGNOSIS — R350 Frequency of micturition: Secondary | ICD-10-CM | POA: Insufficient documentation

## 2018-01-17 DIAGNOSIS — N138 Other obstructive and reflux uropathy: Secondary | ICD-10-CM | POA: Insufficient documentation

## 2018-01-17 DIAGNOSIS — M1990 Unspecified osteoarthritis, unspecified site: Secondary | ICD-10-CM | POA: Insufficient documentation

## 2018-01-17 DIAGNOSIS — G43909 Migraine, unspecified, not intractable, without status migrainosus: Secondary | ICD-10-CM | POA: Diagnosis not present

## 2018-01-17 DIAGNOSIS — Z87442 Personal history of urinary calculi: Secondary | ICD-10-CM | POA: Insufficient documentation

## 2018-01-17 DIAGNOSIS — R3914 Feeling of incomplete bladder emptying: Secondary | ICD-10-CM | POA: Diagnosis not present

## 2018-01-17 DIAGNOSIS — R3912 Poor urinary stream: Secondary | ICD-10-CM | POA: Diagnosis not present

## 2018-01-17 DIAGNOSIS — M4800 Spinal stenosis, site unspecified: Secondary | ICD-10-CM | POA: Diagnosis not present

## 2018-01-17 DIAGNOSIS — F419 Anxiety disorder, unspecified: Secondary | ICD-10-CM | POA: Diagnosis not present

## 2018-01-17 DIAGNOSIS — I10 Essential (primary) hypertension: Secondary | ICD-10-CM | POA: Diagnosis not present

## 2018-01-17 HISTORY — PX: CYSTOSCOPY WITH INSERTION OF UROLIFT: SHX6678

## 2018-01-17 LAB — PSA: PROSTATE SPECIFIC AG, SERUM: 1.9 ng/mL (ref 0.0–4.0)

## 2018-01-17 SURGERY — CYSTOSCOPY WITH INSERTION OF UROLIFT
Anesthesia: General

## 2018-01-17 MED ORDER — FENTANYL CITRATE (PF) 100 MCG/2ML IJ SOLN
INTRAMUSCULAR | Status: DC | PRN
  Administered 2018-01-17 (×2): 50 ug via INTRAVENOUS

## 2018-01-17 MED ORDER — BELLADONNA ALKALOIDS-OPIUM 16.2-60 MG RE SUPP
1.0000 | Freq: Every day | RECTAL | Status: DC
  Administered 2018-01-17: 1 via RECTAL

## 2018-01-17 MED ORDER — OXYCODONE HCL 5 MG/5ML PO SOLN: 5.0000 mg | Freq: Once | ORAL | Status: DC | PRN

## 2018-01-17 MED ORDER — CEFAZOLIN SODIUM-DEXTROSE 1-4 GM/50ML-% IV SOLN
1.0000 g | Freq: Once | INTRAVENOUS | Status: AC
  Administered 2018-01-17: 2 g via INTRAVENOUS

## 2018-01-17 MED ORDER — MIDAZOLAM HCL 2 MG/2ML IJ SOLN
INTRAMUSCULAR | Status: DC | PRN
  Administered 2018-01-17: 2 mg via INTRAVENOUS

## 2018-01-17 MED ORDER — DEXAMETHASONE SODIUM PHOSPHATE 10 MG/ML IJ SOLN
INTRAMUSCULAR | Status: DC | PRN
  Administered 2018-01-17: 6 mg via INTRAVENOUS

## 2018-01-17 MED ORDER — FENTANYL CITRATE (PF) 100 MCG/2ML IJ SOLN
INTRAMUSCULAR | Status: AC
  Administered 2018-01-17: 25 ug via INTRAVENOUS
  Filled 2018-01-17: qty 2

## 2018-01-17 MED ORDER — ONDANSETRON HCL 4 MG/2ML IJ SOLN
INTRAMUSCULAR | Status: DC | PRN
  Administered 2018-01-17: 4 mg via INTRAVENOUS

## 2018-01-17 MED ORDER — CEFAZOLIN SODIUM-DEXTROSE 1-4 GM/50ML-% IV SOLN
INTRAVENOUS | Status: AC
  Filled 2018-01-17: qty 50

## 2018-01-17 MED ORDER — PROPOFOL 10 MG/ML IV BOLUS
INTRAVENOUS | Status: AC
  Filled 2018-01-17: qty 40

## 2018-01-17 MED ORDER — PROPOFOL 10 MG/ML IV BOLUS
INTRAVENOUS | Status: DC | PRN
  Administered 2018-01-17: 150 mg via INTRAVENOUS

## 2018-01-17 MED ORDER — LABETALOL HCL 5 MG/ML IV SOLN
INTRAVENOUS | Status: AC
  Filled 2018-01-17: qty 4

## 2018-01-17 MED ORDER — FENTANYL CITRATE (PF) 100 MCG/2ML IJ SOLN
INTRAMUSCULAR | Status: AC
  Filled 2018-01-17: qty 2

## 2018-01-17 MED ORDER — FENTANYL CITRATE (PF) 100 MCG/2ML IJ SOLN
25.0000 ug | INTRAMUSCULAR | Status: AC | PRN
  Administered 2018-01-17 (×6): 25 ug via INTRAVENOUS

## 2018-01-17 MED ORDER — MIDAZOLAM HCL 2 MG/2ML IJ SOLN
INTRAMUSCULAR | Status: AC
  Filled 2018-01-17: qty 2

## 2018-01-17 MED ORDER — OXYCODONE HCL 5 MG PO TABS: 5.0000 mg | ORAL_TABLET | Freq: Once | ORAL | Status: DC | PRN

## 2018-01-17 MED ORDER — LIDOCAINE HCL (CARDIAC) PF 100 MG/5ML IV SOSY
PREFILLED_SYRINGE | INTRAVENOUS | Status: DC | PRN
  Administered 2018-01-17: 100 mg via INTRAVENOUS

## 2018-01-17 MED ORDER — BELLADONNA ALKALOIDS-OPIUM 16.2-60 MG RE SUPP
RECTAL | Status: AC
  Filled 2018-01-17: qty 1

## 2018-01-17 MED ORDER — LACTATED RINGERS IV SOLN
INTRAVENOUS | Status: DC
  Administered 2018-01-17: 08:00:00 via INTRAVENOUS

## 2018-01-17 MED ORDER — LABETALOL HCL 5 MG/ML IV SOLN
10.0000 mg | Freq: Once | INTRAVENOUS | Status: AC
  Administered 2018-01-17: 10 mg via INTRAVENOUS

## 2018-01-17 MED ORDER — FAMOTIDINE 20 MG PO TABS
ORAL_TABLET | ORAL | Status: AC
  Administered 2018-01-17: 20 mg via ORAL
  Filled 2018-01-17: qty 1

## 2018-01-17 MED ORDER — FAMOTIDINE 20 MG PO TABS
20.0000 mg | ORAL_TABLET | Freq: Once | ORAL | Status: AC
  Administered 2018-01-17: 20 mg via ORAL

## 2018-01-17 SURGICAL SUPPLY — 13 items
BAG DRAIN CYSTO-URO LG1000N (MISCELLANEOUS) ×2 IMPLANT
GLOVE BIO SURGEON STRL SZ8 (GLOVE) ×2 IMPLANT
GOWN STRL REUS W/ TWL LRG LVL3 (GOWN DISPOSABLE) ×1 IMPLANT
GOWN STRL REUS W/ TWL XL LVL3 (GOWN DISPOSABLE) ×1 IMPLANT
GOWN STRL REUS W/TWL LRG LVL3 (GOWN DISPOSABLE) ×2
GOWN STRL REUS W/TWL XL LVL3 (GOWN DISPOSABLE) ×2
KIT TURNOVER CYSTO (KITS) ×2 IMPLANT
PACK CYSTO AR (MISCELLANEOUS) ×2 IMPLANT
SET CYSTO W/LG BORE CLAMP LF (SET/KITS/TRAYS/PACK) ×2 IMPLANT
SURGILUBE 2OZ TUBE FLIPTOP (MISCELLANEOUS) ×2 IMPLANT
SYSTEM UROLIFT (Male Continence) ×4 IMPLANT
WATER STERILE IRR 1000ML POUR (IV SOLUTION) ×2 IMPLANT
WATER STERILE IRR 3000ML UROMA (IV SOLUTION) ×2 IMPLANT

## 2018-01-17 NOTE — Progress Notes (Signed)
Labetalol 10 mg given for blood pressure

## 2018-01-17 NOTE — Anesthesia Procedure Notes (Signed)
Procedure Name: LMA Insertion Date/Time: 01/17/2018 8:45 AM Performed by: Philbert Riser, CRNA Pre-anesthesia Checklist: Patient identified, Emergency Drugs available, Suction available, Patient being monitored and Timeout performed Patient Re-evaluated:Patient Re-evaluated prior to induction Oxygen Delivery Method: Circle system utilized and Simple face mask Preoxygenation: Pre-oxygenation with 100% oxygen Induction Type: IV induction Ventilation: Mask ventilation without difficulty LMA: LMA inserted LMA Size: 4.0 Number of attempts: 1

## 2018-01-17 NOTE — Progress Notes (Signed)
Pt states he feels like he is having spasms   b and o supp given

## 2018-01-17 NOTE — Interval H&P Note (Signed)
History and Physical Interval Note: Patient presents for UroLift placement.  CV RRR, lungs clear  01/17/2018 8:30 AM  [name]  has presented today for surgery, with the diagnosis of bph  The various methods of treatment have been discussed with the patient and family. After consideration of risks, benefits and other options for treatment, the patient has consented to  Procedure(s): CYSTOSCOPY WITH INSERTION OF UROLIFT (N/A) as a surgical intervention .  The patient's history has been reviewed, patient examined, no change in status, stable for surgery.  I have reviewed the patient's chart and labs.  Questions were answered to the patient's satisfaction.     Juan Salas

## 2018-01-17 NOTE — Op Note (Signed)
Preoperative diagnosis: BPH with obstructive symptomatology  Postoperative diagnosis: BPH with obstructive symptomatology  Principal procedure: Urolift procedure, with the placement of 4 implants.  Surgeon: Bernardo Heater  Anesthesia: MAC  Complications: None  Drains: 16 French Foley catheter, to leg bag.  Estimated blood loss: < 5 mL  Indications: 57 year-old male with obstructive symptomatology secondary to BPH.  The patient's symptoms have progressed, and he has requested further management.  Management options including TURP with resection/ablation of the prostate as well as Urolift were discussed.  The patient has chosen to have a Urolift procedure.  He has been instructed to the procedure as well as risks and complications which include but are not limited to infection, bleeding, and inadequate treatment with the Urolift procedure alone, anesthetic complications, among others.  He understands these and desires to proceed.  Findings: Using the 17 French cystoscope, urethra and bladder were inspected.  There were no urethral lesions.  Prostatic urethra was obstructed secondary to bilobar hypertrophy.  The bladder was inspected circumferentially.  This revealed normal findings.  Description of procedure: The patient was properly identified in the holding area.  He received preoperative IV antibiotics.  He was taken to the operating room where general anesthetic was administered with the LMA.  He is placed in the dorsolithotomy position.  Genitalia and perineum were prepped and draped.  Proper timeout was performed.  A 56F cystoscope was inserted into the bladder. The cystoscopy bridge was replaced with a UroLift delivery device.The first treatment site was the patient's right side approximately 1.5cm distal to the bladder neck. The distal tip of the delivery device was then angled laterally approximately 20 degrees at this position to compress the lateral lobe.  The trigger was pulled, thereby  deploying a needle containing the implant through the prostate.  The needle was then retracted, allowing one end of the implant to be delivered to the capsular surface of the prostate.  The implant was then tensioned to assure capsular seating and removal of slack monofilament.  The device was then angled back toward midline and slowly advanced proximally until cystoscopic verification of the monofilament being centered in the delivery bay. The urethral end piece was then affixed to the monofilament thereby tailoring the size of the implant.  Excess filament was then severed.  The delivery device was then re-advanced into the bladder. The delivery device was then replaced with cystoscope and bridge and the implant location and opening effect was confirmed cystoscopically.  The same procedure was then repeated on the left side, and 2 additional implants were delivered just proximal to the verumontanum, again one on right and one on left side of the prostate, following the same technique.    A final cystoscopy was conducted first to inspect the location and state of each implant and second, to confirm the presence of a continuous anterior channel was present through the prostatic urethra with irrigation flow turned off. Two Implants were delivered in total.   Following this, the scope was removed and a 16 French Foley catheter was placed and hooked to dependent drainage.  He was then awakened and taken to the PACU in stable condition.  He tolerated the procedure well.   [employer]. MD

## 2018-01-17 NOTE — Anesthesia Postprocedure Evaluation (Signed)
Anesthesia Post Note  Patient: Juan Salas  Procedure(s) Performed: CYSTOSCOPY WITH INSERTION OF UROLIFT (N/A )  Patient location during evaluation: Endoscopy Anesthesia Type: General Level of consciousness: awake and alert Pain management: pain level controlled Vital Signs Assessment: post-procedure vital signs reviewed and stable Respiratory status: spontaneous breathing, nonlabored ventilation, respiratory function stable and patient connected to nasal cannula oxygen Cardiovascular status: blood pressure returned to baseline and stable Postop Assessment: no apparent nausea or vomiting Anesthetic complications: no     Last Vitals:  Vitals:   01/17/18 1052 01/17/18 1106  BP: (!) 148/94 (!) 164/85  Pulse: 75 62  Resp: (!) 21 16  Temp: (!) 36.1 C 36.4 C  SpO2: 100% 100%    Last Pain:  Vitals:   01/17/18 1106  TempSrc: Temporal  PainSc: 0-No pain                 Precious Haws Piscitello

## 2018-01-17 NOTE — Discharge Instructions (Signed)

## 2018-01-17 NOTE — Anesthesia Preprocedure Evaluation (Signed)
Anesthesia Evaluation  Patient identified by MRN, date of birth, ID band Patient awake    Reviewed: Allergy & Precautions, H&P , NPO status , Patient's Chart, lab work & pertinent test results  History of Anesthesia Complications Negative for: history of anesthetic complications  Airway Mallampati: III  TM Distance: >3 FB Neck ROM: full    Dental  (+) Chipped   Pulmonary neg pulmonary ROS, neg shortness of breath,           Cardiovascular Exercise Tolerance: Good hypertension, (-) angina(-) Past MI and (-) DOE      Neuro/Psych  Headaches, PSYCHIATRIC DISORDERS  Neuromuscular disease    GI/Hepatic negative GI ROS, Neg liver ROS,   Endo/Other  negative endocrine ROS  Renal/GU      Musculoskeletal  (+) Arthritis ,   Abdominal   Peds  Hematology negative hematology ROS (+)   Anesthesia Other Findings Past Medical History: No date: Anxiety 12/2017: Arthritis     Comment:  spinal stenosis 12/2017: BPH (benign prostatic hyperplasia) No date: Headache     Comment:  monthly migraines often d/t sinus and allergy problems.               Naproxen is treatment 12/2017: Hematuria 1977, 1983,2000: History of kidney stones     Comment:  passed on his own. 7 different cycles of stones during               youth. 12/2017: Hypertension     Comment:  not diagnosed but bp elevated recently d/t stress &               pain. No date: Neuromuscular disorder ([place])     Comment:  fibromyalgia. diagnosed in 2000 12/2017: Nocturia associated with benign prostatic hyperplasia  Past Surgical History: 2000: [place]: BACK SURGERY     Comment:  laminectomy x 2.  No date: COLONOSCOPY 2000: HERNIA REPAIR; Left     Comment:  double inguinal hernia repairs 1978: NASAL SEPTUM SURGERY     Comment:  repair of sinuses also 2005: TESTICULAR TORSION REPAIR 1970: TONSILLECTOMY     Reproductive/Obstetrics negative OB  ROS                             Anesthesia Physical Anesthesia Plan  ASA: III  Anesthesia Plan: General LMA   Post-op Pain Management:    Induction: Intravenous  PONV Risk Score and Plan: Ondansetron, Dexamethasone and Midazolam  Airway Management Planned: LMA  Additional Equipment:   Intra-op Plan:   Post-operative Plan: Extubation in OR  Informed Consent: I have reviewed the patients History and Physical, chart, labs and discussed the procedure including the risks, benefits and alternatives for the proposed anesthesia with the patient or authorized representative who has indicated his/her understanding and acceptance.   Dental Advisory Given  Plan Discussed with: Anesthesiologist, CRNA and Surgeon  Anesthesia Plan Comments: (Patient consented for risks of anesthesia including but not limited to:  - adverse reactions to medications - damage to teeth, lips or other oral mucosa - sore throat or hoarseness - Damage to heart, brain, lungs or loss of life  Patient voiced understanding.)        Anesthesia Quick Evaluation

## 2018-01-17 NOTE — Progress Notes (Signed)
IV in left forearm infiltrated. Swelling in the forearm is visible. The patient doesn't complain of any pain. No discoloration is visible. No bleeding after removal of IV. Patient directed to use ice pack and keep arm elevated. Patient will call provider if swelling does not resolve.

## 2018-01-17 NOTE — Transfer of Care (Signed)
Immediate Anesthesia Transfer of Care Note  Patient: Juan Salas  Procedure(s) Performed: CYSTOSCOPY WITH INSERTION OF UROLIFT (N/A )  Patient Location: PACU  Anesthesia Type:General  Level of Consciousness: sedated  Airway & Oxygen Therapy: Patient Spontanous Breathing and Patient connected to face mask oxygen  Post-op Assessment: Report given to RN and Post -op Vital signs reviewed and stable  Post vital signs: Reviewed and stable  Last Vitals:  Vitals Value Taken Time  BP 97/62 01/17/2018  9:28 AM  Temp    Pulse 54 01/17/2018  9:28 AM  Resp    SpO2 100 % 01/17/2018  9:28 AM  Vitals shown include unvalidated device data.  Last Pain:  Vitals:   01/17/18 0811  TempSrc: Temporal  PainSc: 4          Complications: No apparent anesthesia complications

## 2018-01-17 NOTE — Telephone Encounter (Signed)
I contacted patient and he stated his bladder spasms have resolved.  He did not desire medication.  He is voiding and feels his bladder is emptying completely.

## 2018-01-17 NOTE — Telephone Encounter (Signed)
Pt wife called stating pt had surgery this morning, Urolift and is having bladder spasms, states someone in recovery called and asked for pain meds. Wife is calling to follow up on that. Please send to [facility] on [address]. Please advise.

## 2018-01-17 NOTE — Anesthesia Post-op Follow-up Note (Signed)
Anesthesia QCDR form completed.        

## 2018-01-18 ENCOUNTER — Encounter: Payer: Self-pay | Admitting: Urology

## 2018-01-18 ENCOUNTER — Telehealth: Payer: Self-pay

## 2018-01-18 NOTE — Telephone Encounter (Signed)
Called pt no answer. LM for pt informing him of the information below.  °

## 2018-01-18 NOTE — Telephone Encounter (Signed)
-----   Message from Abbie Sons, MD sent at 01/17/2018  7:02 PM EDT ----- PSA was normal at 1.9

## 2018-01-26 ENCOUNTER — Telehealth: Payer: Self-pay

## 2018-01-26 LAB — LIPID PANEL
CHOLESTEROL TOTAL: 285 mg/dL — AB (ref 100–199)
Chol/HDL Ratio: 4.1 ratio (ref 0.0–5.0)
HDL: 69 mg/dL (ref >39)
LDL Calculated: 186 mg/dL — ABNORMAL HIGH (ref 0–99)
TRIGLYCERIDES: 152 mg/dL — AB (ref 0–149)
VLDL Cholesterol Cal: 30 mg/dL (ref 5–40)

## 2018-01-26 LAB — COMPREHENSIVE METABOLIC PANEL
ALK PHOS: 88 IU/L (ref 39–117)
ALT: 121 IU/L — AB (ref 0–44)
AST: 53 IU/L — AB (ref 0–40)
Albumin/Globulin Ratio: 2.1 (ref 1.2–2.2)
Albumin: 4.6 g/dL (ref 3.5–5.5)
BUN/Creatinine Ratio: 15 (ref 9–20)
BUN: 16 mg/dL (ref 6–24)
Bilirubin Total: 0.6 mg/dL (ref 0.0–1.2)
CALCIUM: 9.7 mg/dL (ref 8.7–10.2)
CO2: 25 mmol/L (ref 20–29)
CREATININE: 1.1 mg/dL (ref 0.76–1.27)
Chloride: 100 mmol/L (ref 96–106)
GFR calc Af Amer: 85 mL/min/{1.73_m2} (ref >59)
GFR calc non Af Amer: 74 mL/min/{1.73_m2} (ref >59)
GLOBULIN, TOTAL: 2.2 g/dL (ref 1.5–4.5)
Glucose: 100 mg/dL — ABNORMAL HIGH (ref 65–99)
POTASSIUM: 4.6 mmol/L (ref 3.5–5.2)
SODIUM: 139 mmol/L (ref 134–144)
Total Protein: 6.8 g/dL (ref 6.0–8.5)

## 2018-01-26 LAB — CBC WITH DIFFERENTIAL/PLATELET
BASOS ABS: 0 10*3/uL (ref 0.0–0.2)
Basos: 1 %
EOS (ABSOLUTE): 0.1 10*3/uL (ref 0.0–0.4)
EOS: 3 %
HEMATOCRIT: 42.1 % (ref 37.5–51.0)
Hemoglobin: 14.5 g/dL (ref 13.0–17.7)
IMMATURE GRANULOCYTES: 0 %
Immature Grans (Abs): 0 10*3/uL (ref 0.0–0.1)
LYMPHS ABS: 1.9 10*3/uL (ref 0.7–3.1)
Lymphs: 44 %
MCH: 31.9 pg (ref 26.6–33.0)
MCHC: 34.4 g/dL (ref 31.5–35.7)
MCV: 93 fL (ref 79–97)
MONOS ABS: 0.3 10*3/uL (ref 0.1–0.9)
Monocytes: 8 %
NEUTROS PCT: 44 %
Neutrophils Absolute: 2 10*3/uL (ref 1.4–7.0)
PLATELETS: 250 10*3/uL (ref 150–450)
RBC: 4.54 x10E6/uL (ref 4.14–5.80)
RDW: 12.9 % (ref 12.3–15.4)
WBC: 4.4 10*3/uL (ref 3.4–10.8)

## 2018-01-26 LAB — TSH: TSH: 1.12 u[IU]/mL (ref 0.450–4.500)

## 2018-01-26 LAB — SPECIMEN STATUS REPORT

## 2018-01-26 NOTE — Telephone Encounter (Signed)
-----   Message from Margo Common, [place] sent at 01/26/2018  7:44 AM EDT ----- Cholesterol and triglycerides high. Liver enzymes are high. Will check for hepatitis (ask lab to run hepatitis panel on recent blood sample). Liver changes could be associated with recent surgery. Recommend low fat diet, increase in water intake and consider statin if hepatitis panel negative.

## 2018-01-26 NOTE — Telephone Encounter (Signed)
Patient and [place] was advised. Patient states he will like to get lab work recheck after surgery.

## 2018-01-26 NOTE — Telephone Encounter (Signed)
Agree 

## 2018-01-27 LAB — SPECIMEN STATUS REPORT

## 2018-01-27 LAB — HEPATITIS PANEL, ACUTE
HEP B C IGM: NEGATIVE
Hep A IgM: NEGATIVE
Hep C Virus Ab: <0.1 s/co ratio (ref 0.0–0.9)
Hepatitis B Surface Ag: NEGATIVE

## 2018-02-16 ENCOUNTER — Ambulatory Visit (INDEPENDENT_AMBULATORY_CARE_PROVIDER_SITE_OTHER): Payer: BLUE CROSS/BLUE SHIELD | Admitting: Urology

## 2018-02-16 ENCOUNTER — Encounter: Payer: Self-pay | Admitting: Urology

## 2018-02-16 VITALS — BP 158/104 | HR 71 | Ht 73.0 in | Wt 188.4 lb

## 2018-02-16 DIAGNOSIS — Z09 Encounter for follow-up examination after completed treatment for conditions other than malignant neoplasm: Secondary | ICD-10-CM

## 2018-02-16 DIAGNOSIS — N401 Enlarged prostate with lower urinary tract symptoms: Secondary | ICD-10-CM | POA: Diagnosis not present

## 2018-02-16 DIAGNOSIS — N138 Other obstructive and reflux uropathy: Secondary | ICD-10-CM | POA: Diagnosis not present

## 2018-02-16 LAB — BLADDER SCAN AMB NON-IMAGING

## 2018-02-16 NOTE — Progress Notes (Signed)
02/16/2018 4:22 PM   Juan Salas 03/11/1961 [id number]  Referring provider: Margo Common, [place], [place] [address]  Chief Complaint  Patient presents with  . Routine Post Op    HPI: 57 year old male status post UroLift on 01/17/2018 for BPH with bothersome lower urinary tract symptoms.  He had bladder spasms for the first 24 hours after surgery which subsequently resolved.  He states he is doing very well.  Is voiding with an excellent stream and his frequency and urgency has resolved.  Preop: IPSS-29/35 QoL 6/6    PVR: 148 mL Today: IPSS-1/35   QoL 1/6    PVR: 0 mL   PMH: Past Medical History:  Diagnosis Date  . Anxiety   . Arthritis 12/2017   spinal stenosis  . BPH (benign prostatic hyperplasia) 12/2017  . Headache    monthly migraines often d/t sinus and allergy problems. Naproxen is treatment  . Hematuria 12/2017  . History of kidney stones 1977, 1983,2000   passed on his own. 7 different cycles of stones during youth.  . Hypertension 12/2017   not diagnosed but bp elevated recently d/t stress & pain.  Marland Kitchen Neuromuscular disorder (HCC)    fibromyalgia. diagnosed in 2000  . Nocturia associated with benign prostatic hyperplasia 12/2017    Surgical History: Past Surgical History:  Procedure Laterality Date  . APPENDECTOMY  2000  . [place]   laminectomy x 2.   . COLONOSCOPY    . CYSTOSCOPY WITH INSERTION OF UROLIFT N/A 01/17/2018   Procedure: CYSTOSCOPY WITH INSERTION OF UROLIFT;  Surgeon: Abbie Sons, MD;  Location: [facility] ORS;  Service: Urology;  Laterality: N/A;  . HERNIA REPAIR Left 2000   double inguinal hernia repairs  . NASAL SEPTUM SURGERY  1978   repair of sinuses also  . TESTICULAR TORSION REPAIR  2005  . TONSILLECTOMY  1970    Home Medications:  Allergies as of 02/16/2018      Reactions   Seasonal Ic [cholestatin] Other (See Comments)   Sinus issues      Medication List        Accurate as  of 02/16/18  4:22 PM. Always use your most recent med list.          acetaminophen 500 MG tablet Commonly known as:  TYLENOL Take 500 mg by mouth every 4 (four) hours as needed for moderate pain or headache.   calcium carbonate 500 MG chewable tablet Commonly known as:  TUMS - dosed in mg elemental calcium Chew 1 tablet by mouth 2 (two) times daily as needed for indigestion or heartburn.   chlorpheniramine 4 MG tablet Commonly known as:  CHLOR-TRIMETON Take 2 mg by mouth 2 (two) times daily as needed for allergies.   cyanocobalamin 2000 MCG tablet Take 2,000 mcg by mouth daily.   diphenhydrAMINE 25 MG tablet Commonly known as:  BENADRYL Take 25 mg by mouth at bedtime as needed for sleep.   Fish Oil 1000 MG Caps Take 1,000 mg by mouth 2 (two) times daily.   GREEN TEA EXTRACT PO Take by mouth. Uses this in his tea bags periodically   ketoconazole 2 % shampoo Commonly known as:  NIZORAL Apply 1 application topically 2 (two) times a week.   L-THEANINE PO Take 200 mg by mouth daily as needed (stress).   Magnesium 250 MG Tabs Take 250 mg by mouth at bedtime.   montelukast 10 MG tablet Commonly known as:  SINGULAIR Take  10 mg by mouth every evening.   multivitamin with minerals Tabs tablet Take 1 tablet by mouth daily.   NAPROSYN 250 MG tablet Generic drug:  naproxen Take 250 mg by mouth as needed.   oxymetazoline 0.05 % nasal spray Commonly known as:  AFRIN Place 1 spray into both nostrils 2 (two) times daily as needed for congestion.   Saw Palmetto (Serenoa repens) 320 MG Caps Take 320 mg by mouth at bedtime.   tamsulosin 0.4 MG Caps capsule Commonly known as:  FLOMAX Take 1 capsule (0.4 mg total) by mouth daily.   Turmeric 500 MG Caps Take 500 mg by mouth daily.   Vitamin D3 5000 units Caps Take 5,000 Units by mouth daily.       Allergies:  Allergies  Allergen Reactions  . Seasonal Ic [Cholestatin] Other (See Comments)    Sinus issues     Family History: Family History  Problem Relation Age of Onset  . Hypertension Mother   . Prostatitis Father   . Hypertension Father     Social History:  reports that he has never smoked. He has never used smokeless tobacco. He reports that he does not drink alcohol or use drugs.  ROS: UROLOGY Frequent Urination?: No Hard to postpone urination?: No Burning/pain with urination?: No Get up at night to urinate?: No Leakage of urine?: No Urine stream starts and stops?: No Trouble starting stream?: No Do you have to strain to urinate?: No Blood in urine?: No Urinary tract infection?: No Sexually transmitted disease?: No Injury to kidneys or bladder?: No Painful intercourse?: No Weak stream?: No Erection problems?: No Penile pain?: No  Gastrointestinal Nausea?: No Vomiting?: No Indigestion/heartburn?: No Diarrhea?: No Constipation?: No  Constitutional Fever: No Night sweats?: No Weight loss?: No Fatigue?: No  Skin Skin rash/lesions?: No Itching?: No  Eyes Blurred vision?: No Double vision?: No  Ears/Nose/Throat Sore throat?: No Sinus problems?: No  Hematologic/Lymphatic Swollen glands?: No Easy bruising?: No  Cardiovascular Leg swelling?: No Chest pain?: No  Respiratory Cough?: No Shortness of breath?: No  Endocrine Excessive thirst?: No  Musculoskeletal Back pain?: Yes Joint pain?: No  Neurological Headaches?: No Dizziness?: No  Psychologic Depression?: No Anxiety?: No  Physical Exam: BP (!) 158/104 (BP Location: Left Arm, Patient Position: Sitting, Cuff Size: Normal)   Pulse 71   Ht 6\' 1"  (1.854 m)   Wt 188 lb 6.4 oz (85.5 kg)   BMI 24.86 kg/m   Constitutional:  Alert and oriented, No acute distress.    Assessment & Plan:   Doing very well 1 month status post UroLift.  Follow-up 6 months and he was instructed to call earlier for any change in his symptoms.   Abbie Sons, [place] [address], [place], [place] [address] [phone]

## 2018-02-17 ENCOUNTER — Ambulatory Visit: Payer: BLUE CROSS/BLUE SHIELD | Admitting: Urology

## 2018-09-05 ENCOUNTER — Ambulatory Visit: Payer: BLUE CROSS/BLUE SHIELD | Admitting: Urology

## 2019-08-14 NOTE — Progress Notes (Signed)
Complete physical exam   Patient: Juan Salas   DOB: June 30, 1960   59 y.o. Male  MRN: [id number] Visit Date: 08/16/2019  Today's healthcare provider: Vernie Murders, PA   Chief Complaint  Patient presents with  . Annual Exam   Subjective    Juan Salas is a 59 y.o. male who presents today for a complete physical exam.  He reports consuming a general diet. Home exercise routine includes weight lifting. He generally feels fairly well. He reports sleeping fairly well. He does have additional problems to discuss today.  HPI  Last CPE: 01/16/2018 Due for Colonoscopy Due for Tetanus vaccine  There is no immunization history for the selected administration types on file for this patient.  Past Medical History:  Diagnosis Date  . Anxiety   . Arthritis 12/2017   spinal stenosis  . BPH (benign prostatic hyperplasia) 12/2017  . Headache    monthly migraines often d/t sinus and allergy problems. Naproxen is treatment  . Hematuria 12/2017  . History of kidney stones 1977, 1983,2000   passed on his own. 7 different cycles of stones during youth.  . Hypertension 12/2017   not diagnosed but bp elevated recently d/t stress & pain.  Marland Kitchen Neuromuscular disorder (HCC)    fibromyalgia. diagnosed in 2000  . Nocturia associated with benign prostatic hyperplasia 12/2017   Past Surgical History:  Procedure Laterality Date  . APPENDECTOMY  2000  . [place]   laminectomy x 2.   . COLONOSCOPY    . CYSTOSCOPY WITH INSERTION OF UROLIFT N/A 01/17/2018   Procedure: CYSTOSCOPY WITH INSERTION OF UROLIFT;  Surgeon: Abbie Sons, MD;  Location: [facility] ORS;  Service: Urology;  Laterality: N/A;  . HERNIA REPAIR Left 2000   double inguinal hernia repairs  . NASAL SEPTUM SURGERY  1978   repair of sinuses also  . TESTICULAR TORSION REPAIR  2005  . TONSILLECTOMY  1970   Social History   Socioeconomic History  . Marital status: Married    Spouse name: [name]  .  Number of children: 2  . Years of education: Not on file  . Highest education level: Not on file  Occupational History  . Occupation: Management consultant: [employer]  Tobacco Use  . Smoking status: Never Smoker  . Smokeless tobacco: Never Used  Substance and Sexual Activity  . Alcohol use: No  . Drug use: No  . Sexual activity: Yes  Other Topics Concern  . Not on file  Social History Narrative   AGING AND UNHEALTHY IN-LAWS LIVE WITH PATIENT AND WIFE. CURRENTLY ONE IS IN HOSPITAL AND IS VERY SICK. THEY TAKE A LOT OF WORK.   Social Determinants of Health   Financial Resource Strain:   . Difficulty of Paying Living Expenses:   Food Insecurity:   . Worried About Charity fundraiser in the Last Year:   . Arboriculturist in the Last Year:   Transportation Needs:   . Film/video editor (Medical):   Marland Kitchen Lack of Transportation (Non-Medical):   Physical Activity:   . Days of Exercise per Week:   . Minutes of Exercise per Session:   Stress:   . Feeling of Stress :   Social Connections:   . Frequency of Communication with Friends and Family:   . Frequency of Social Gatherings with Friends and Family:   . Attends Religious Services:   . Active Member of Clubs or Organizations:   .  Attends Archivist Meetings:   Marland Kitchen Marital Status:   Intimate Partner Violence:   . Fear of Current or Ex-Partner:   . Emotionally Abused:   Marland Kitchen Physically Abused:   . Sexually Abused:    Family Status  Relation Name Status  . Mother  Alive  . Father  Alive   Family History  Problem Relation Age of Onset  . Hypertension Mother   . Prostatitis Father   . Hypertension Father    Allergies  Allergen Reactions  . Seasonal Ic [Cholestatin] Other (See Comments)    Sinus issues    Patient Care Team: Sharena Dibenedetto, Vickki Muff, PA as PCP - General (Family Medicine)   Medications: Outpatient Medications Prior to Visit  Medication Sig  . acetaminophen (TYLENOL) 500 MG tablet Take 500 mg by  mouth every 4 (four) hours as needed for moderate pain or headache.   . calcium carbonate (TUMS - DOSED IN MG ELEMENTAL CALCIUM) 500 MG chewable tablet Chew 1 tablet by mouth 2 (two) times daily as needed for indigestion or heartburn.  . chlorpheniramine (CHLOR-TRIMETON) 4 MG tablet Take 2 mg by mouth 2 (two) times daily as needed for allergies.  . Cholecalciferol (VITAMIN D3) 5000 units CAPS Take 5,000 Units by mouth daily.  . cyanocobalamin 2000 MCG tablet Take 2,000 mcg by mouth daily.  . diphenhydrAMINE (BENADRYL) 25 MG tablet Take 25 mg by mouth at bedtime as needed for sleep.  Nyoka Cowden Tea, Camellia sinensis, (GREEN TEA EXTRACT PO) Take by mouth. Uses this in his tea bags periodically  . L-THEANINE PO Take 200 mg by mouth daily as needed (stress).  . Magnesium 250 MG TABS Take 250 mg by mouth at bedtime.  . Multiple Vitamin (MULTIVITAMIN WITH MINERALS) TABS tablet Take 1 tablet by mouth daily.  . naproxen (NAPROSYN) 250 MG tablet Take 250 mg by mouth as needed.  . Omega-3 Fatty Acids (FISH OIL) 1000 MG CAPS Take 1,000 mg by mouth 2 (two) times daily.  . Turmeric 500 MG CAPS Take 500 mg by mouth daily.  . [DISCONTINUED] ketoconazole (NIZORAL) 2 % shampoo Apply 1 application topically 2 (two) times a week.  . [DISCONTINUED] montelukast (SINGULAIR) 10 MG tablet Take 10 mg by mouth every evening.  . [DISCONTINUED] oxymetazoline (AFRIN) 0.05 % nasal spray Place 1 spray into both nostrils 2 (two) times daily as needed for congestion.  . [DISCONTINUED] Saw Palmetto, Serenoa repens, 320 MG CAPS Take 320 mg by mouth at bedtime.  . [DISCONTINUED] tamsulosin (FLOMAX) 0.4 MG CAPS capsule Take 1 capsule (0.4 mg total) by mouth daily. (Patient taking differently: Take 0.4 mg by mouth every evening. )   Facility-Administered Medications Prior to Visit  Medication Dose Route Frequency Provider  . lidocaine (XYLOCAINE) 2 % jelly 1 application  1 application Urethral Once Stoioff, Ronda Fairly, MD    Review of  Systems  Constitutional: Positive for fatigue. Negative for appetite change, chills and fever.  HENT: Positive for congestion, ear pain, sinus pressure, sneezing and trouble swallowing. Negative for hearing loss and nosebleeds.   Eyes: Negative for pain and visual disturbance.  Respiratory: Negative for cough, chest tightness, shortness of breath and wheezing.   Cardiovascular: Negative for chest pain, palpitations and leg swelling.  Gastrointestinal: Positive for abdominal distention, abdominal pain, anal bleeding, constipation, diarrhea, nausea and rectal pain. Negative for blood in stool and vomiting.  Endocrine: Negative for polydipsia, polyphagia and polyuria.  Genitourinary: Negative for dysuria and flank pain.  Musculoskeletal: Positive for back pain  and myalgias. Negative for arthralgias, joint swelling and neck stiffness.  Skin: Negative for color change, rash and wound.       Growth on right eyelid  Allergic/Immunologic: Positive for environmental allergies.  Neurological: Positive for headaches (due to sinus and migraines). Negative for dizziness, tremors, seizures, speech difficulty, weakness and light-headedness.  Psychiatric/Behavioral: Negative for behavioral problems, confusion, decreased concentration, dysphoric mood and sleep disturbance. The patient is not nervous/anxious.   All other systems reviewed and are negative.     Objective    BP (!) 170/108 (BP Location: Right Arm)   Pulse 74   Temp (!) 97.5 F (36.4 C) (Temporal)   Resp 16   Ht 5' 11.5" (1.816 m)   Wt 190 lb (86.2 kg)   SpO2 95% Comment: room air  BMI 26.13 kg/m    Physical Exam Constitutional:      Appearance: He is well-developed.  HENT:     Head: Normocephalic and atraumatic.     Right Ear: External ear normal.     Left Ear: External ear normal.     Nose: Nose normal.  Eyes:     General:        Right eye: No discharge.     Conjunctiva/sclera: Conjunctivae normal.     Pupils: Pupils are  equal, round, and reactive to light.  Neck:     Thyroid: No thyromegaly.     Trachea: No tracheal deviation.  Cardiovascular:     Rate and Rhythm: Normal rate and regular rhythm.     Heart sounds: Normal heart sounds. No murmur.  Pulmonary:     Effort: Pulmonary effort is normal. No respiratory distress.     Breath sounds: Normal breath sounds. No wheezing or rales.  Chest:     Chest wall: No tenderness.  Abdominal:     General: There is no distension.     Palpations: Abdomen is soft. There is no mass.     Tenderness: There is no abdominal tenderness. There is no guarding or rebound.  Genitourinary:    Penis: Normal.      Testes: Normal.     Prostate: Normal.     Rectum: Normal. Guaiac result positive.  Musculoskeletal:        General: No tenderness. Normal range of motion.     Cervical back: Normal range of motion and neck supple.     Comments: Well healed scar in lower lumbar spine from past laminectomy. Very thick yellow left great toe nail. Stiff flexed DIP joints of 2nd, 3rd and 4th toes of each foot.  Lymphadenopathy:     Cervical: No cervical adenopathy.  Skin:    General: Skin is warm and dry.     Findings: No erythema or rash.  Neurological:     Mental Status: He is alert and oriented to person, place, and time.     Cranial Nerves: No cranial nerve deficit.     Motor: No abnormal muscle tone.     Coordination: Coordination normal.     Deep Tendon Reflexes: Reflexes are normal and symmetric. Reflexes normal.  Psychiatric:        Behavior: Behavior normal.        Thought Content: Thought content normal.        Judgment: Judgment normal.     Depression Screen  PHQ 2/9 Scores 08/16/2019 01/17/2018  PHQ - 2 Score 0 0  PHQ- 9 Score 2 -     Assessment & Plan    Routine Health Maintenance  and Physical Exam  Exercise Activities and Dietary recommendations Goals   Recommend low fat DASH diet and continue 30-40 minutes of exercise 4 days a week.     There is no  immunization history for the selected administration types on file for this patient.  Health Maintenance  Topic Date Due  . HIV Screening  Never done  . TETANUS/TDAP  Never done  . COLONOSCOPY  Never done  . INFLUENZA VACCINE  11/18/2019  . Hepatitis C Screening  Completed    Discussed health benefits of physical activity, and encouraged him to engage in regular exercise appropriate for his age and condition.  1. Annual physical exam General health good. BPH with LUTS improved with UroLift surgery in 2019. Given anticipatory counseling and DASH diet. Check routine labs. - CBC with Differential/Platelet - Comprehensive metabolic panel - Lipid panel - PSA - TSH  2. Essential hypertension BP very high to day. Recheck during exam was 176/100. Family history positive for father and mother having hypertension, also. Will get routine labs and treat with Lisinopril 5 mg qd. Recheck pending lab reports. - CBC with Differential/Platelet - Comprehensive metabolic panel - Lipid panel - TSH - lisinopril (ZESTRIL) 5 MG tablet; Take 1 tablet (5 mg total) by mouth daily.  Dispense: 90 tablet; Refill: 3  3. Colon cancer screening Occasional hemorrhoidal bleeding and due for screening colonoscopy. - Ambulatory referral to Gastroenterology  4. Need for diphtheria-tetanus-pertussis (Tdap) vaccine Postponed Tdap due to significant hypertension. Encouraged to consider COVID vaccination.  5. Hx of decompressive lumbar laminectomy Lumbar laminectomy for HNP in 1990 and 1994. Scar well healed and no significant back pain. Some stiffness but no functional limitations.  6. Chalazion of right lower eyelid Recurrence of yellow chalazion inside of the right lower eyelid laterally. Schedule ophthalmology referral. - Ambulatory referral to Ophthalmology  7. Onychomycosis of toenail Thick yellow breaking left great toenail. Also, has Mallet Toes and wants referral to a podiatrist. - Ambulatory referral  to Podiatry  8. Benign prostatic hyperplasia with lower urinary tract symptoms, symptom details unspecified Better urine flow since UroLift surgery in 2019 by Dr. Bernardo Heater (urologist). Will recheck PSA.   No follow-ups on file.     Andres Shad, PA, have reviewed all documentation for this visit. The documentation on 08/16/19 for the exam, diagnosis, procedures, and orders are all accurate and complete.    Vernie Murders, [place] [phone] (phone) [phone] (fax)  [place]

## 2019-08-16 ENCOUNTER — Other Ambulatory Visit: Payer: Self-pay

## 2019-08-16 ENCOUNTER — Ambulatory Visit (INDEPENDENT_AMBULATORY_CARE_PROVIDER_SITE_OTHER): Payer: 59 | Admitting: Family Medicine

## 2019-08-16 ENCOUNTER — Encounter: Payer: Self-pay | Admitting: Family Medicine

## 2019-08-16 VITALS — BP 170/108 | HR 74 | Temp 97.5°F | Resp 16 | Ht 71.5 in | Wt 190.0 lb

## 2019-08-16 DIAGNOSIS — I10 Essential (primary) hypertension: Secondary | ICD-10-CM

## 2019-08-16 DIAGNOSIS — B351 Tinea unguium: Secondary | ICD-10-CM

## 2019-08-16 DIAGNOSIS — Z0000 Encounter for general adult medical examination without abnormal findings: Secondary | ICD-10-CM | POA: Diagnosis not present

## 2019-08-16 DIAGNOSIS — Z23 Encounter for immunization: Secondary | ICD-10-CM | POA: Diagnosis not present

## 2019-08-16 DIAGNOSIS — H0012 Chalazion right lower eyelid: Secondary | ICD-10-CM

## 2019-08-16 DIAGNOSIS — Z9889 Other specified postprocedural states: Secondary | ICD-10-CM

## 2019-08-16 DIAGNOSIS — Z1211 Encounter for screening for malignant neoplasm of colon: Secondary | ICD-10-CM | POA: Diagnosis not present

## 2019-08-16 DIAGNOSIS — N401 Enlarged prostate with lower urinary tract symptoms: Secondary | ICD-10-CM

## 2019-08-16 MED ORDER — LISINOPRIL 5 MG PO TABS: 5.0000 mg | ORAL_TABLET | Freq: Every day | ORAL | 3 refills | Status: DC

## 2019-08-16 NOTE — Patient Instructions (Signed)
DASH Eating Plan DASH stands for "Dietary Approaches to Stop Hypertension." The DASH eating plan is a healthy eating plan that has been shown to reduce high blood pressure (hypertension). It may also reduce your risk for type 2 diabetes, heart disease, and stroke. The DASH eating plan may also help with weight loss. What are tips for following this plan?  General guidelines  Avoid eating more than 2,300 mg (milligrams) of salt (sodium) a day. If you have hypertension, you may need to reduce your sodium intake to 1,500 mg a day.  Limit alcohol intake to no more than 1 drink a day for nonpregnant women and 2 drinks a day for men. One drink equals 12 oz of beer, 5 oz of wine, or 1 oz of hard liquor.  Work with your health care provider to maintain a healthy body weight or to lose weight. Ask what an ideal weight is for you.  Get at least 30 minutes of exercise that causes your heart to beat faster (aerobic exercise) most days of the week. Activities may include walking, swimming, or biking.  Work with your health care provider or diet and nutrition specialist (dietitian) to adjust your eating plan to your individual calorie needs. Reading food labels   Check food labels for the amount of sodium per serving. Choose foods with less than 5 percent of the Daily Value of sodium. Generally, foods with less than 300 mg of sodium per serving fit into this eating plan.  To find whole grains, look for the word "whole" as the first word in the ingredient list. Shopping  Buy products labeled as "low-sodium" or "no salt added."  Buy fresh foods. Avoid canned foods and premade or frozen meals. Cooking  Avoid adding salt when cooking. Use salt-free seasonings or herbs instead of table salt or sea salt. Check with your health care provider or pharmacist before using salt substitutes.  Do not fry foods. Cook foods using healthy methods such as baking, boiling, grilling, and broiling instead.  Cook with  heart-healthy oils, such as olive, canola, soybean, or sunflower oil. Meal planning  Eat a balanced diet that includes: ? 5 or more servings of fruits and vegetables each day. At each meal, try to fill half of your plate with fruits and vegetables. ? Up to 6-8 servings of whole grains each day. ? Less than 6 oz of lean meat, poultry, or fish each day. A 3-oz serving of meat is about the same size as a deck of cards. One egg equals 1 oz. ? 2 servings of low-fat dairy each day. ? A serving of nuts, seeds, or beans 5 times each week. ? Heart-healthy fats. Healthy fats called Omega-3 fatty acids are found in foods such as flaxseeds and coldwater fish, like sardines, salmon, and mackerel.  Limit how much you eat of the following: ? Canned or prepackaged foods. ? Food that is high in trans fat, such as fried foods. ? Food that is high in saturated fat, such as fatty meat. ? Sweets, desserts, sugary drinks, and other foods with added sugar. ? Full-fat dairy products.  Do not salt foods before eating.  Try to eat at least 2 vegetarian meals each week.  Eat more home-cooked food and less restaurant, buffet, and fast food.  When eating at a restaurant, ask that your food be prepared with less salt or no salt, if possible. What foods are recommended? The items listed may not be a complete list. Talk with your dietitian about   what dietary choices are best for you. Grains Whole-grain or whole-wheat bread. Whole-grain or whole-wheat pasta. Brown rice. Oatmeal. Quinoa. Bulgur. Whole-grain and low-sodium cereals. Pita bread. Low-fat, low-sodium crackers. Whole-wheat flour tortillas. Vegetables Fresh or frozen vegetables (raw, steamed, roasted, or grilled). Low-sodium or reduced-sodium tomato and vegetable juice. Low-sodium or reduced-sodium tomato sauce and tomato paste. Low-sodium or reduced-sodium canned vegetables. Fruits All fresh, dried, or frozen fruit. Canned fruit in natural juice (without  added sugar). Meat and other protein foods Skinless chicken or turkey. Ground chicken or turkey. Pork with fat trimmed off. Fish and seafood. Egg whites. Dried beans, peas, or lentils. Unsalted nuts, nut butters, and seeds. Unsalted canned beans. Lean cuts of beef with fat trimmed off. Low-sodium, lean deli meat. Dairy Low-fat (1%) or fat-free (skim) milk. Fat-free, low-fat, or reduced-fat cheeses. Nonfat, low-sodium ricotta or cottage cheese. Low-fat or nonfat yogurt. Low-fat, low-sodium cheese. Fats and oils Soft margarine without trans fats. Vegetable oil. Low-fat, reduced-fat, or light mayonnaise and salad dressings (reduced-sodium). Canola, safflower, olive, soybean, and sunflower oils. Avocado. Seasoning and other foods Herbs. Spices. Seasoning mixes without salt. Unsalted popcorn and pretzels. Fat-free sweets. What foods are not recommended? The items listed may not be a complete list. Talk with your dietitian about what dietary choices are best for you. Grains Baked goods made with fat, such as croissants, muffins, or some breads. Dry pasta or rice meal packs. Vegetables Creamed or fried vegetables. Vegetables in a cheese sauce. Regular canned vegetables (not low-sodium or reduced-sodium). Regular canned tomato sauce and paste (not low-sodium or reduced-sodium). Regular tomato and vegetable juice (not low-sodium or reduced-sodium). Pickles. Olives. Fruits Canned fruit in a light or heavy syrup. Fried fruit. Fruit in cream or butter sauce. Meat and other protein foods Fatty cuts of meat. Ribs. Fried meat. Bacon. Sausage. Bologna and other processed lunch meats. Salami. Fatback. Hotdogs. Bratwurst. Salted nuts and seeds. Canned beans with added salt. Canned or smoked fish. Whole eggs or egg yolks. Chicken or turkey with skin. Dairy Whole or 2% milk, cream, and half-and-half. Whole or full-fat cream cheese. Whole-fat or sweetened yogurt. Full-fat cheese. Nondairy creamers. Whipped toppings.  Processed cheese and cheese spreads. Fats and oils Butter. Stick margarine. Lard. Shortening. Ghee. Bacon fat. Tropical oils, such as coconut, palm kernel, or palm oil. Seasoning and other foods Salted popcorn and pretzels. Onion salt, garlic salt, seasoned salt, table salt, and sea salt. Worcestershire sauce. Tartar sauce. Barbecue sauce. Teriyaki sauce. Soy sauce, including reduced-sodium. Steak sauce. Canned and packaged gravies. Fish sauce. Oyster sauce. Cocktail sauce. Horseradish that you find on the shelf. Ketchup. Mustard. Meat flavorings and tenderizers. Bouillon cubes. Hot sauce and Tabasco sauce. Premade or packaged marinades. Premade or packaged taco seasonings. Relishes. Regular salad dressings. Where to find more information:  National Heart, Lung, and Blood Institute: www.nhlbi.nih.gov  American Heart Association: www.heart.org Summary  The DASH eating plan is a healthy eating plan that has been shown to reduce high blood pressure (hypertension). It may also reduce your risk for type 2 diabetes, heart disease, and stroke.  With the DASH eating plan, you should limit salt (sodium) intake to 2,300 mg a day. If you have hypertension, you may need to reduce your sodium intake to 1,500 mg a day.  When on the DASH eating plan, aim to eat more fresh fruits and vegetables, whole grains, lean proteins, low-fat dairy, and heart-healthy fats.  Work with your health care provider or diet and nutrition specialist (dietitian) to adjust your eating plan to your   individual calorie needs. This information is not intended to replace advice given to you by your health care provider. Make sure you discuss any questions you have with your health care provider. Document Revised: 03/18/2017 Document Reviewed: 03/29/2016 Elsevier Patient Education  2020 Elsevier Inc.  

## 2019-08-23 LAB — CBC WITH DIFFERENTIAL/PLATELET
Basophils Absolute: 0 10*3/uL (ref 0.0–0.2)
Basos: 1 %
EOS (ABSOLUTE): 0.1 10*3/uL (ref 0.0–0.4)
Eos: 3 %
Hematocrit: 42.5 % (ref 37.5–51.0)
Hemoglobin: 14.6 g/dL (ref 13.0–17.7)
Immature Grans (Abs): 0 10*3/uL (ref 0.0–0.1)
Immature Granulocytes: 0 %
Lymphocytes Absolute: 1.9 10*3/uL (ref 0.7–3.1)
Lymphs: 45 %
MCH: 31.7 pg (ref 26.6–33.0)
MCHC: 34.4 g/dL (ref 31.5–35.7)
MCV: 92 fL (ref 79–97)
Monocytes Absolute: 0.3 10*3/uL (ref 0.1–0.9)
Monocytes: 7 %
Neutrophils Absolute: 1.8 10*3/uL (ref 1.4–7.0)
Neutrophils: 44 %
Platelets: 271 10*3/uL (ref 150–450)
RBC: 4.6 x10E6/uL (ref 4.14–5.80)
RDW: 13.2 % (ref 11.6–15.4)
WBC: 4.2 10*3/uL (ref 3.4–10.8)

## 2019-08-23 LAB — COMPREHENSIVE METABOLIC PANEL
ALT: 36 IU/L (ref 0–44)
AST: 24 IU/L (ref 0–40)
Albumin/Globulin Ratio: 2 (ref 1.2–2.2)
Albumin: 4.5 g/dL (ref 3.8–4.9)
Alkaline Phosphatase: 64 IU/L (ref 39–117)
BUN/Creatinine Ratio: 16 (ref 10–24)
BUN: 16 mg/dL (ref 8–27)
Bilirubin Total: 0.5 mg/dL (ref 0.0–1.2)
CO2: 25 mmol/L (ref 20–29)
Calcium: 9.6 mg/dL (ref 8.6–10.2)
Chloride: 100 mmol/L (ref 96–106)
Creatinine, Ser: 1.01 mg/dL (ref 0.76–1.27)
GFR calc Af Amer: 93 mL/min/{1.73_m2} (ref >59)
GFR calc non Af Amer: 80 mL/min/{1.73_m2} (ref >59)
Globulin, Total: 2.3 g/dL (ref 1.5–4.5)
Glucose: 101 mg/dL — ABNORMAL HIGH (ref 65–99)
Potassium: 5 mmol/L (ref 3.5–5.2)
Sodium: 137 mmol/L (ref 134–144)
Total Protein: 6.8 g/dL (ref 6.0–8.5)

## 2019-08-23 LAB — LIPID PANEL
Chol/HDL Ratio: 5.4 ratio — ABNORMAL HIGH (ref 0.0–5.0)
Cholesterol, Total: 321 mg/dL — ABNORMAL HIGH (ref 100–199)
HDL: 59 mg/dL (ref >39)
LDL Chol Calc (NIH): 239 mg/dL — ABNORMAL HIGH (ref 0–99)
Triglycerides: 128 mg/dL (ref 0–149)
VLDL Cholesterol Cal: 23 mg/dL (ref 5–40)

## 2019-08-23 LAB — TSH: TSH: 1.61 u[IU]/mL (ref 0.450–4.500)

## 2019-08-23 LAB — PSA: Prostate Specific Ag, Serum: 1 ng/mL (ref 0.0–4.0)

## 2019-08-28 ENCOUNTER — Telehealth: Payer: Self-pay

## 2019-08-28 ENCOUNTER — Telehealth: Payer: Self-pay | Admitting: Family Medicine

## 2019-08-28 NOTE — Telephone Encounter (Signed)
LMTCB, [facility] Triage Nurse may give patient results  

## 2019-08-28 NOTE — Telephone Encounter (Signed)
-----   Message from Margo Common, [place] sent at 08/27/2019  6:21 PM EDT ----- All lab tests normal except very high total and LDL cholesterol. Recommend low fat diet and Atorvastatin 40 mg qd #30 & 3 RF. Recheck BP in 2 weeks in the office.

## 2019-08-28 NOTE — Telephone Encounter (Signed)
Pt called back in as stated office had called him, effort made to have pt speak to NT and make appt. Pt said had seen results on line and would call back in a week or so to make appt.

## 2019-08-28 NOTE — Telephone Encounter (Signed)
Attempted to contact patient. Left VM to return call to office for labs results.

## 2019-08-28 NOTE — Telephone Encounter (Signed)
Attempted to reach pt, left VM to CB. 

## 2019-08-29 ENCOUNTER — Telehealth (INDEPENDENT_AMBULATORY_CARE_PROVIDER_SITE_OTHER): Payer: Self-pay | Admitting: Gastroenterology

## 2019-08-29 DIAGNOSIS — N5312 Painful ejaculation: Secondary | ICD-10-CM | POA: Insufficient documentation

## 2019-08-29 DIAGNOSIS — M47819 Spondylosis without myelopathy or radiculopathy, site unspecified: Secondary | ICD-10-CM | POA: Insufficient documentation

## 2019-08-29 DIAGNOSIS — R42 Dizziness and giddiness: Secondary | ICD-10-CM | POA: Insufficient documentation

## 2019-08-29 DIAGNOSIS — J329 Chronic sinusitis, unspecified: Secondary | ICD-10-CM | POA: Insufficient documentation

## 2019-08-29 DIAGNOSIS — Z1211 Encounter for screening for malignant neoplasm of colon: Secondary | ICD-10-CM

## 2019-08-29 DIAGNOSIS — K649 Unspecified hemorrhoids: Secondary | ICD-10-CM | POA: Insufficient documentation

## 2019-08-29 DIAGNOSIS — Z8669 Personal history of other diseases of the nervous system and sense organs: Secondary | ICD-10-CM | POA: Insufficient documentation

## 2019-08-29 MED ORDER — NA SULFATE-K SULFATE-MG SULF 17.5-3.13-1.6 GM/177ML PO SOLN: 1.0000 | Freq: Once | ORAL | 0 refills | Status: AC

## 2019-08-29 NOTE — Progress Notes (Signed)
Gastroenterology Pre-Procedure Review  Request Date: Thursday  Requesting Physician: Dr. Vicente Males  PATIENT REVIEW QUESTIONS: The patient responded to the following health history questions as indicated:    1. Are you having any GI issues? yes (hemorrhoids) 2. Do you have a personal history of Polyps? unsure maybe 1 at the age of 67 3. Do you have a family history of Colon Cancer or Polyps? no 4. Diabetes Mellitus? no 5. Joint replacements in the past 12 months?2019 urolift at [facility] 6. Major health problems in the past 3 months?no 7. Any artificial heart valves, MVP, or defibrillator?no    MEDICATIONS & ALLERGIES:    Patient reports the following regarding taking any anticoagulation/antiplatelet therapy:   Plavix, Coumadin, Eliquis, Xarelto, Lovenox, Pradaxa, Brilinta, or Effient? no Aspirin? no  Patient confirms/reports the following medications:  Current Outpatient Medications  Medication Sig Dispense Refill  . acetaminophen (TYLENOL) 500 MG tablet Take 500 mg by mouth every 4 (four) hours as needed for moderate pain or headache.     . calcium carbonate (TUMS - DOSED IN MG ELEMENTAL CALCIUM) 500 MG chewable tablet Chew 1 tablet by mouth 2 (two) times daily as needed for indigestion or heartburn.    . chlorpheniramine (CHLOR-TRIMETON) 4 MG tablet Take 2 mg by mouth 2 (two) times daily as needed for allergies.    . Cholecalciferol (VITAMIN D3) 5000 units CAPS Take 5,000 Units by mouth daily.    . cyanocobalamin 2000 MCG tablet Take 2,000 mcg by mouth daily.    . diphenhydrAMINE (BENADRYL) 25 MG tablet Take 25 mg by mouth at bedtime as needed for sleep.    . L-THEANINE PO Take 200 mg by mouth daily as needed (stress).    Marland Kitchen lisinopril (ZESTRIL) 5 MG tablet Take 1 tablet (5 mg total) by mouth daily. 90 tablet 3  . Magnesium 250 MG TABS Take 250 mg by mouth at bedtime.    . Multiple Vitamin (MULTIVITAMIN WITH MINERALS) TABS tablet Take 1 tablet by mouth daily.    . naproxen (NAPROSYN) 250 MG  tablet Take 250 mg by mouth as needed.    . Omega-3 Fatty Acids (FISH OIL) 1000 MG CAPS Take 1,000 mg by mouth 2 (two) times daily.    . Turmeric 500 MG CAPS Take 500 mg by mouth daily.    Nyoka Cowden Tea, Camellia sinensis, (GREEN TEA EXTRACT PO) Take by mouth. Uses this in his tea bags periodically    . Na Sulfate-K Sulfate-Mg Sulf 17.5-3.13-1.6 GM/177ML SOLN Take 1 kit by mouth once for 1 dose. 354 mL 0   Current Facility-Administered Medications  Medication Dose Route Frequency Provider Last Rate Last Admin  . lidocaine (XYLOCAINE) 2 % jelly 1 application  1 application Urethral Once Stoioff, Ronda Fairly, MD        Patient confirms/reports the following allergies:  Allergies  Allergen Reactions  . Seasonal Ic [Cholestatin] Other (See Comments)    Sinus issues    Orders Placed This Encounter  Procedures  . Procedural/ Surgical Case Request: COLONOSCOPY WITH PROPOFOL    Standing Status:   Standing    Number of Occurrences:   1    Order Specific Question:   Pre-op diagnosis    Answer:   screening colonoscopy    Order Specific Question:   CPT Code    Answer:   53967    AUTHORIZATION INFORMATION Primary Insurance: 1D#: Group #:  Secondary Insurance: 1D#: Group #:  SCHEDULE INFORMATION: Date: Thursday 09/06/19 Time: Location:[facility]

## 2019-08-29 NOTE — Telephone Encounter (Signed)
Attempted to call pt. On mobile and home phone; left vm to return call to office to go over lab results.

## 2019-08-30 ENCOUNTER — Telehealth: Payer: Self-pay

## 2019-08-30 NOTE — Telephone Encounter (Signed)
Patient contacted the office noticed that on colonoscopy triage-I placed "Urolift procedure" with the question regarding joint replacement.  It was answered with the wrong question.  He wanted to make sure that it was properly noted.  Urolift is not a joint procedure. It is a band around the prostate. He said he was advised to make sure physician is aware of this.  Other GI issues:IBS.  Colonoscopy is scheduled for 09/06/19 with Dr. Vicente Males.  Thanks,  [place], [place]

## 2019-09-04 ENCOUNTER — Other Ambulatory Visit
Admission: RE | Admit: 2019-09-04 | Discharge: 2019-09-04 | Disposition: A | Discharge status: Home / Self Care | Payer: 59 | Source: Ambulatory Visit | Attending: Gastroenterology | Admitting: Gastroenterology

## 2019-09-04 DIAGNOSIS — Z01812 Encounter for preprocedural laboratory examination: Secondary | ICD-10-CM | POA: Diagnosis present

## 2019-09-04 DIAGNOSIS — Z20822 Contact with and (suspected) exposure to covid-19: Secondary | ICD-10-CM | POA: Diagnosis not present

## 2019-09-04 LAB — SARS CORONAVIRUS 2 (TAT 6-24 HRS): SARS Coronavirus 2: NEGATIVE

## 2019-09-06 ENCOUNTER — Ambulatory Visit
Admission: RE | Admit: 2019-09-06 | Discharge: 2019-09-06 | Disposition: A | Discharge status: Home / Self Care | Payer: 59 | Attending: Gastroenterology | Admitting: Gastroenterology

## 2019-09-06 ENCOUNTER — Encounter: Payer: Self-pay | Admitting: Gastroenterology

## 2019-09-06 ENCOUNTER — Ambulatory Visit: Payer: 59 | Admitting: Anesthesiology

## 2019-09-06 ENCOUNTER — Other Ambulatory Visit: Payer: Self-pay

## 2019-09-06 ENCOUNTER — Encounter
Admission: RE | Disposition: A | Discharge status: Home / Self Care | Payer: Self-pay | Source: Home / Self Care | Attending: Gastroenterology

## 2019-09-06 DIAGNOSIS — Z79899 Other long term (current) drug therapy: Secondary | ICD-10-CM | POA: Insufficient documentation

## 2019-09-06 DIAGNOSIS — Z1211 Encounter for screening for malignant neoplasm of colon: Secondary | ICD-10-CM | POA: Diagnosis present

## 2019-09-06 DIAGNOSIS — D122 Benign neoplasm of ascending colon: Secondary | ICD-10-CM | POA: Insufficient documentation

## 2019-09-06 HISTORY — PX: COLONOSCOPY WITH PROPOFOL: SHX5780

## 2019-09-06 SURGERY — COLONOSCOPY WITH PROPOFOL
Anesthesia: General

## 2019-09-06 MED ORDER — LIDOCAINE HCL (CARDIAC) PF 100 MG/5ML IV SOSY
PREFILLED_SYRINGE | INTRAVENOUS | Status: DC | PRN
  Administered 2019-09-06: 40 mg via INTRAVENOUS

## 2019-09-06 MED ORDER — MIDAZOLAM HCL 2 MG/2ML IJ SOLN
INTRAMUSCULAR | Status: DC | PRN
  Administered 2019-09-06: 2 mg via INTRAVENOUS

## 2019-09-06 MED ORDER — SODIUM CHLORIDE 0.9 % IV SOLN: INTRAVENOUS | Status: DC

## 2019-09-06 MED ORDER — PROPOFOL 10 MG/ML IV BOLUS
INTRAVENOUS | Status: DC | PRN
  Administered 2019-09-06: 100 mg via INTRAVENOUS

## 2019-09-06 MED ORDER — PROPOFOL 500 MG/50ML IV EMUL
INTRAVENOUS | Status: AC
  Filled 2019-09-06: qty 50

## 2019-09-06 MED ORDER — PROPOFOL 500 MG/50ML IV EMUL
INTRAVENOUS | Status: DC | PRN
  Administered 2019-09-06: 150 ug/kg/min via INTRAVENOUS

## 2019-09-06 MED ORDER — MIDAZOLAM HCL 2 MG/2ML IJ SOLN
INTRAMUSCULAR | Status: AC
  Filled 2019-09-06: qty 2

## 2019-09-06 NOTE — H&P (Signed)
Juan Bellows, MD [address] [place], [place], [place], [address] [address], [place], [place], [place], [address] Phone: [phone]  Fax: [phone]  Primary Care Physician:  Margo Common, PA   Pre-Procedure History & Physical: HPI:  Juan Salas is a 59 y.o. male is here for an colonoscopy.   Past Medical History:  Diagnosis Date  . Anxiety   . Arthritis 12/2017   spinal stenosis  . BPH (benign prostatic hyperplasia) 12/2017  . Headache    monthly migraines often d/t sinus and allergy problems. Naproxen is treatment  . Hematuria 12/2017  . History of kidney stones 1977, 1983,2000   passed on his own. 7 different cycles of stones during youth.  . Hypertension 12/2017   not diagnosed but bp elevated recently d/t stress & pain.  Marland Kitchen Neuromuscular disorder (H[facility])    fibromyalgia. diagnosed in 2000  . Nocturia associated with benign prostatic hyperplasia 12/2017    Past Surgical History:  Procedure Laterality Date  . APPENDECTOMY  2000  . [place]   laminectomy x 2.   . COLONOSCOPY    . CYSTOSCOPY WITH INSERTION OF UROLIFT N/A 01/17/2018   Procedure: CYSTOSCOPY WITH INSERTION OF UROLIFT;  Surgeon: Abbie Sons, MD;  Location: [facility] ORS;  Service: Urology;  Laterality: N/A;  . CYSTOSCOPY WITH INSERTION OF UROLIFT  01/2018  . HERNIA REPAIR Left 2000   double inguinal hernia repairs  . NASAL SEPTUM SURGERY  1978   repair of sinuses also  . TESTICULAR TORSION REPAIR  2005  . TONSILLECTOMY  1970    Prior to Admission medications   Medication Sig Start Date End Date Taking? Authorizing Provider  acetaminophen (TYLENOL) 500 MG tablet Take 500 mg by mouth every 4 (four) hours as needed for moderate pain or headache.    Yes [provider]  calcium carbonate (TUMS - DOSED IN MG ELEMENTAL CALCIUM) 500 MG chewable tablet Chew 1 tablet by mouth 2 (two) times daily as needed for indigestion or heartburn.   Yes [provider]  chlorpheniramine (CHLOR-TRIMETON) 4 MG tablet Take 2 mg by mouth 2 (two) times daily as needed for allergies.   Yes [provider]  Cholecalciferol (VITAMIN D3) 5000 units CAPS Take 5,000 Units by mouth daily.   Yes [provider]  lisinopril (ZESTRIL) 5 MG tablet Take 1 tablet (5 mg total) by mouth daily. 08/16/19  Yes Chrismon, Vickki Muff, PA  cyanocobalamin 2000 MCG tablet Take 2,000 mcg by mouth daily.    [provider]  diphenhydrAMINE (BENADRYL) 25 MG tablet Take 25 mg by mouth at bedtime as needed for sleep.    [provider]  Nyoka Cowden Tea, Camellia sinensis, (GREEN TEA EXTRACT PO) Take by mouth. Uses this in his tea bags periodically    [provider]  L-THEANINE PO Take 200 mg by mouth daily as needed (stress).    [provider]  Magnesium 250 MG TABS Take 250 mg by mouth at bedtime.    [provider]  Multiple Vitamin (MULTIVITAMIN WITH MINERALS) TABS tablet Take 1 tablet by mouth daily.    [provider]  naproxen (NAPROSYN) 250 MG tablet Take 250 mg by mouth as needed.    [provider]  Omega-3 Fatty Acids (FISH OIL) 1000 MG CAPS Take 1,000 mg by mouth 2 (two) times daily.    [provider]  Turmeric 500 MG CAPS Take 500 mg by mouth daily.  [provider]    Allergies as of 08/29/2019 - Review Complete 08/29/2019  Allergen Reaction Noted  . Seasonal ic [cholestatin] Other (See Comments) 01/13/2018    Family History  Problem Relation Age of Onset  . Hypertension Mother   . Prostatitis Father   . Hypertension Father     Social History   Socioeconomic History  . Marital status: Married    Spouse name: [name]  . Number of children: 2  . Years of education: Not on file  . Highest education level: Not on file  Occupational History  . Occupation: Management consultant: [employer]  Tobacco Use  . Smoking status: Never Smoker  . Smokeless tobacco:  Never Used  Substance and Sexual Activity  . Alcohol use: No  . Drug use: No  . Sexual activity: Yes  Other Topics Concern  . Not on file  Social History Narrative   AGING AND UNHEALTHY IN-LAWS LIVE WITH PATIENT AND WIFE. CURRENTLY ONE IS IN HOSPITAL AND IS VERY SICK. THEY TAKE A LOT OF WORK.   Social Determinants of Health   Financial Resource Strain:   . Difficulty of Paying Living Expenses:   Food Insecurity:   . Worried About Charity fundraiser in the Last Year:   . Arboriculturist in the Last Year:   Transportation Needs:   . Film/video editor (Medical):   Marland Kitchen Lack of Transportation (Non-Medical):   Physical Activity:   . Days of Exercise per Week:   . Minutes of Exercise per Session:   Stress:   . Feeling of Stress :   Social Connections:   . Frequency of Communication with Friends and Family:   . Frequency of Social Gatherings with Friends and Family:   . Attends Religious Services:   . Active Member of Clubs or Organizations:   . Attends Archivist Meetings:   Marland Kitchen Marital Status:   Intimate Partner Violence:   . Fear of Current or Ex-Partner:   . Emotionally Abused:   Marland Kitchen Physically Abused:   . Sexually Abused:     Review of Systems: See HPI, otherwise negative ROS  Physical Exam: BP (!) 131/103   Pulse 74   Temp (!) 96.8 F (36 C) (Temporal)   Resp 20   Ht 6' (1.829 m)   Wt 83.9 kg   SpO2 100%   BMI 25.09 kg/m  General:   Alert,  pleasant and cooperative in NAD Head:  Normocephalic and atraumatic. Neck:  Supple; no masses or thyromegaly. Lungs:  Clear throughout to auscultation, normal respiratory effort.    Heart:  +S1, +S2, Regular rate and rhythm, No edema. Abdomen:  Soft, nontender and nondistended. Normal bowel sounds, without guarding, and without rebound.   Neurologic:  Alert and  oriented x4;  grossly normal neurologically.  Impression/Plan: Juan Salas is here for an colonoscopy to be performed for Screening colonoscopy  average risk   Risks, benefits, limitations, and alternatives regarding  colonoscopy have been reviewed with the patient.  Questions have been answered.  All parties agreeable.   Juan Bellows, MD  09/06/2019, 10:38 AM

## 2019-09-06 NOTE — Anesthesia Preprocedure Evaluation (Addendum)
Anesthesia Evaluation  Patient identified by MRN, date of birth, ID band Patient awake    Reviewed: Allergy & Precautions, H&P , NPO status , reviewed documented beta blocker date and time   Airway Mallampati: III  TM Distance: >3 FB Neck ROM: full    Dental  (+) Chipped   Pulmonary    Pulmonary exam normal        Cardiovascular hypertension, Normal cardiovascular exam     Neuro/Psych  Headaches, Anxiety  Neuromuscular disease    GI/Hepatic neg GERD  ,  Endo/Other    Renal/GU      Musculoskeletal  (+) Arthritis ,   Abdominal   Peds  Hematology   Anesthesia Other Findings Past Medical History: No date: Anxiety 12/2017: Arthritis     Comment:  spinal stenosis 12/2017: BPH (benign prostatic hyperplasia) No date: Headache     Comment:  monthly migraines often d/t sinus and allergy problems.               Naproxen is treatment 12/2017: Hematuria 1977, 1983,2000: History of kidney stones     Comment:  passed on his own. 7 different cycles of stones during               youth. 12/2017: Hypertension     Comment:  not diagnosed but bp elevated recently d/t stress &               pain. No date: Neuromuscular disorder ([facility])     Comment:  fibromyalgia. diagnosed in 2000 12/2017: Nocturia associated with benign prostatic hyperplasia  Past Surgical History: 2000: [place]: BACK SURGERY     Comment:  laminectomy x 2.  No date: COLONOSCOPY 01/17/2018: CYSTOSCOPY WITH INSERTION OF UROLIFT; N/A     Comment:  Procedure: CYSTOSCOPY WITH INSERTION OF UROLIFT;                Surgeon: Abbie Sons, MD;  Location: [facility] ORS;                Service: Urology;  Laterality: N/A; 2000: HERNIA REPAIR; Left     Comment:  double inguinal hernia repairs 1978: NASAL SEPTUM SURGERY     Comment:  repair of sinuses also 2005: TESTICULAR TORSION REPAIR 1970: TONSILLECTOMY     Reproductive/Obstetrics                             Anesthesia Physical Anesthesia Plan  ASA: II  Anesthesia Plan: General   Post-op Pain Management:    Induction: Intravenous  PONV Risk Score and Plan: Treatment may vary due to age or medical condition and TIVA  Airway Management Planned: Nasal Cannula and Natural Airway  Additional Equipment:   Intra-op Plan:   Post-operative Plan:   Informed Consent: I have reviewed the patients History and Physical, chart, labs and discussed the procedure including the risks, benefits and alternatives for the proposed anesthesia with the patient or authorized representative who has indicated his/her understanding and acceptance.     Dental Advisory Given  Plan Discussed with: CRNA  Anesthesia Plan Comments:         Anesthesia Quick Evaluation

## 2019-09-06 NOTE — Op Note (Signed)
[facility] Patient Name: Juan Salas Procedure Date: 09/06/2019 10:41 AM MRN: [id number] Account #: [id number] Date of Birth: 1961-02-20 Admit Type: Outpatient Age: 59 Room: [facility] ENDO ROOM 1 Gender: Male Note Status: Finalized Procedure:             Colonoscopy Indications:           Screening for colorectal malignant neoplasm Providers:             Jonathon Bellows MD, MD Referring MD:          Vickki Muff. Chrismon, MD (Referring MD) Medicines:             Monitored Anesthesia Care Complications:         No immediate complications. Procedure:             Pre-Anesthesia Assessment:                        - Prior to the procedure, a History and Physical was                         performed, and patient medications, allergies and                         sensitivities were reviewed. The patient's tolerance                         of previous anesthesia was reviewed.                        - The risks and benefits of the procedure and the                         sedation options and risks were discussed with the                         patient. All questions were answered and informed                         consent was obtained.                        - ASA Grade Assessment: II - A patient with mild                         systemic disease.                        After obtaining informed consent, the colonoscope was                         passed under direct vision. Throughout the procedure,                         the patient's blood pressure, pulse, and oxygen                         saturations were monitored continuously. The                         Colonoscope was introduced through the anus  and                         advanced to the the cecum, identified by the                         appendiceal orifice. The colonoscopy was performed                         with ease. The patient tolerated the procedure well.                         The  quality of the bowel preparation was good. Findings:      The perianal and digital rectal examinations were normal.      A 4 mm polyp was found in the ascending colon. The polyp was sessile.       The polyp was removed with a cold snare. Resection and retrieval were       complete.      The exam was otherwise without abnormality on direct and retroflexion       views. Impression:            - One 4 mm polyp in the ascending colon, removed with                         a cold snare. Resected and retrieved.                        - The examination was otherwise normal on direct and                         retroflexion views. Recommendation:        - Discharge patient to home (with escort).                        - Resume previous diet.                        - Continue present medications.                        - Await pathology results.                        - Repeat colonoscopy for surveillance based on                         pathology results. Procedure Code(s):     --- Professional ---                        [id number], Colonoscopy, flexible; with removal of                         tumor(s), polyp(s), or other lesion(s) by snare                         technique Diagnosis Code(s):     --- Professional ---                        Z12.11, Encounter for  screening for malignant neoplasm                         of colon                        K63.5, Polyp of colon CPT copyright 2019 American [employer] Association. All rights reserved. The codes documented in this report are preliminary and upon coder review may  be revised to meet current compliance requirements. Jonathon Bellows, MD Jonathon Bellows MD, MD 09/06/2019 11:10:42 AM This report has been signed electronically. Number of Addenda: 0 Note Initiated On: 09/06/2019 10:41 AM Scope Withdrawal Time: 0 hours 11 minutes 39 seconds  Total Procedure Duration: 0 hours 16 minutes 46 seconds  Estimated Blood Loss:  Estimated blood loss: none.       [facility]

## 2019-09-06 NOTE — Transfer of Care (Signed)
Immediate Anesthesia Transfer of Care Note  Patient: Juan Salas  Procedure(s) Performed: Procedure(s): COLONOSCOPY WITH PROPOFOL (N/A)  Patient Location: PACU and Endoscopy Unit  Anesthesia Type:General  Level of Consciousness: sedated  Airway & Oxygen Therapy: Patient Spontanous Breathing and Patient connected to nasal cannula oxygen  Post-op Assessment: Report given to RN and Post -op Vital signs reviewed and stable  Post vital signs: Reviewed and stable  Last Vitals:  Vitals:   09/06/19 1134 09/06/19 1140  BP: 129/88 139/87  Pulse: 74 66  Resp: 14 15  Temp:    SpO2: [id number]    Complications: No apparent anesthesia complications

## 2019-09-06 NOTE — Anesthesia Procedure Notes (Signed)
Date/Time: 09/06/2019 10:47 AM Performed by: Doreen Salvage, CRNA Pre-anesthesia Checklist: Patient identified, Emergency Drugs available, Suction available and Patient being monitored Patient Re-evaluated:Patient Re-evaluated prior to induction Oxygen Delivery Method: Nasal cannula Induction Type: IV induction Dental Injury: Teeth and Oropharynx as per pre-operative assessment  Comments: Nasal cannula with etCO2 monitoring

## 2019-09-06 NOTE — Anesthesia Postprocedure Evaluation (Signed)
Anesthesia Post Note  Patient: Juan Salas  Procedure(s) Performed: COLONOSCOPY WITH PROPOFOL (N/A )  Patient location during evaluation: Endoscopy Anesthesia Type: General Level of consciousness: awake and alert Pain management: pain level controlled Vital Signs Assessment: post-procedure vital signs reviewed and stable Respiratory status: spontaneous breathing, nonlabored ventilation and respiratory function stable Cardiovascular status: blood pressure returned to baseline and stable Postop Assessment: no apparent nausea or vomiting Anesthetic complications: no     Last Vitals:  Vitals:   09/06/19 1134 09/06/19 1140  BP: 129/88 139/87  Pulse: 74 66  Resp: 14 15  Temp:    SpO2: 100% 99%    Last Pain:  Vitals:   09/06/19 1140  TempSrc:   PainSc: 0-No pain                 Alphonsus Sias

## 2019-09-07 ENCOUNTER — Encounter: Payer: Self-pay | Admitting: *Deleted

## 2019-09-07 LAB — SURGICAL PATHOLOGY

## 2019-09-10 ENCOUNTER — Encounter: Payer: Self-pay | Admitting: Gastroenterology

## 2019-09-12 ENCOUNTER — Encounter: Payer: Self-pay | Admitting: Podiatry

## 2019-09-12 ENCOUNTER — Other Ambulatory Visit: Payer: Self-pay

## 2019-09-12 ENCOUNTER — Ambulatory Visit (INDEPENDENT_AMBULATORY_CARE_PROVIDER_SITE_OTHER): Payer: 59 | Admitting: Podiatry

## 2019-09-12 DIAGNOSIS — L603 Nail dystrophy: Secondary | ICD-10-CM

## 2019-09-12 NOTE — Progress Notes (Signed)
Subjective:  Patient ID: Juan Salas, male    DOB: [birth year],  MRN: [id number] HPI Chief Complaint  Patient presents with  . Nail Problem    Hallux left - toenail injury with softball cleats about 10 years ago, thick, brittle at times, tried OTC meds, filing periodically  . New Patient (Initial Visit)    59 y.o. male presents with the above complaint.   ROS: Denies fever chills nausea vomiting muscle aches pains calf pain back pain chest pain shortness of breath.  Past Medical History:  Diagnosis Date  . Anxiety   . Arthritis 12/2017   spinal stenosis  . BPH (benign prostatic hyperplasia) 12/2017  . Headache    monthly migraines often d/t sinus and allergy problems. Naproxen is treatment  . Hematuria 12/2017  . History of kidney stones 1977, 1983,2000   passed on his own. 7 different cycles of stones during youth.  . Hypertension 12/2017   not diagnosed but bp elevated recently d/t stress & pain.  Marland Kitchen Neuromuscular disorder (HCC)    fibromyalgia. diagnosed in 2000  . Nocturia associated with benign prostatic hyperplasia 12/2017   Past Surgical History:  Procedure Laterality Date  . APPENDECTOMY  2000  . [place]   laminectomy x 2.   . COLONOSCOPY    . COLONOSCOPY WITH PROPOFOL N/A 09/06/2019   Procedure: COLONOSCOPY WITH PROPOFOL;  Surgeon: Jonathon Bellows, MD;  Location: [facility] ENDOSCOPY;  Service: Gastroenterology;  Laterality: N/A;  . CYSTOSCOPY WITH INSERTION OF UROLIFT N/A 01/17/2018   Procedure: CYSTOSCOPY WITH INSERTION OF UROLIFT;  Surgeon: Abbie Sons, MD;  Location: [facility] ORS;  Service: Urology;  Laterality: N/A;  . CYSTOSCOPY WITH INSERTION OF UROLIFT  01/2018  . HERNIA REPAIR Left 2000   double inguinal hernia repairs  . NASAL SEPTUM SURGERY  1978   repair of sinuses also  . TESTICULAR TORSION REPAIR  2005  . TONSILLECTOMY  1970    Current Outpatient Medications:  .  acetaminophen (TYLENOL) 500 MG tablet, Take 500 mg by mouth every 4  (four) hours as needed for moderate pain or headache. , Disp: , Rfl:  .  calcium carbonate (TUMS - DOSED IN MG ELEMENTAL CALCIUM) 500 MG chewable tablet, Chew 1 tablet by mouth 2 (two) times daily as needed for indigestion or heartburn., Disp: , Rfl:  .  chlorpheniramine (CHLOR-TRIMETON) 4 MG tablet, Take 2 mg by mouth 2 (two) times daily as needed for allergies., Disp: , Rfl:  .  Cholecalciferol (VITAMIN D3) 5000 units CAPS, Take 5,000 Units by mouth daily., Disp: , Rfl:  .  cyanocobalamin 2000 MCG tablet, Take 2,000 mcg by mouth daily., Disp: , Rfl:  .  diphenhydrAMINE (BENADRYL) 25 MG tablet, Take 25 mg by mouth at bedtime as needed for sleep., Disp: , Rfl:  .  Green Tea, Camellia sinensis, (GREEN TEA EXTRACT PO), Take by mouth. Uses this in his tea bags periodically, Disp: , Rfl:  .  L-THEANINE PO, Take 200 mg by mouth daily as needed (stress)., Disp: , Rfl:  .  lisinopril (ZESTRIL) 5 MG tablet, Take 1 tablet (5 mg total) by mouth daily., Disp: 90 tablet, Rfl: 3 .  Magnesium 250 MG TABS, Take 250 mg by mouth at bedtime., Disp: , Rfl:  .  Multiple Vitamin (MULTIVITAMIN WITH MINERALS) TABS tablet, Take 1 tablet by mouth daily., Disp: , Rfl:  .  naproxen (NAPROSYN) 250 MG tablet, Take 250 mg by mouth as needed., Disp: , Rfl:  .  Omega-3 Fatty Acids (FISH OIL) 1000 MG CAPS, Take 1,000 mg by mouth 2 (two) times daily., Disp: , Rfl:  .  Turmeric 500 MG CAPS, Take 500 mg by mouth daily., Disp: , Rfl:   Current Facility-Administered Medications:  .  lidocaine (XYLOCAINE) 2 % jelly 1 application, 1 application, Urethral, Once, Stoioff, Scott C, MD  Allergies  Allergen Reactions  . Seasonal Ic [Cholestatin] Other (See Comments)    Sinus issues   Review of Systems Objective:  There were no vitals filed for this visit.  General: Well developed, nourished, in no acute distress, alert and oriented x3   Dermatological: Skin is warm, dry and supple bilateral. Nails x 10 are well maintained;  remaining integument appears unremarkable at this time. There are no open sores, no preulcerative lesions, no rash or signs of infection present. Hallux nail left does demonstrate a nail dystrophy distally cannot rule out onychomycosis. There is considerable small petechial type lesions around the nail of the hallux with some dry vesicular eruptions consistent with tinea pedis.  Vascular: Dorsalis Pedis artery and Posterior Tibial artery pedal pulses are 2/4 bilateral with immedate capillary fill time. Pedal hair growth present. No varicosities and no lower extremity edema present bilateral.   Neruologic: Grossly intact via light touch bilateral. Vibratory intact via tuning fork bilateral. Protective threshold with Semmes Wienstein monofilament intact to all pedal sites bilateral. Patellar and Achilles deep tendon reflexes 2+ bilateral. No Babinski or clonus noted bilateral.   Musculoskeletal: No gross boney pedal deformities bilateral. No pain, crepitus, or limitation noted with foot and ankle range of motion bilateral. Muscular strength 5/5 in all groups tested bilateral.  Gait: Unassisted, Nonantalgic.    Radiographs:  None taken  Assessment & Plan:   Assessment: Nail dystrophy cannot rule out onychomycosis hallux left tinea pedis hallux left  Plan: Samples of the skin and nail were taken today to be sent for pathologic evaluation I will follow-up with him in 1 month.     Chaunta Bejarano T. Van, [place]

## 2019-10-03 ENCOUNTER — Encounter: Payer: Self-pay | Admitting: Family Medicine

## 2019-10-17 ENCOUNTER — Other Ambulatory Visit: Payer: Self-pay

## 2019-10-17 ENCOUNTER — Encounter: Payer: Self-pay | Admitting: Podiatry

## 2019-10-17 ENCOUNTER — Ambulatory Visit (INDEPENDENT_AMBULATORY_CARE_PROVIDER_SITE_OTHER): Payer: 59 | Admitting: Podiatry

## 2019-10-17 DIAGNOSIS — L603 Nail dystrophy: Secondary | ICD-10-CM

## 2019-10-17 MED ORDER — TERBINAFINE HCL 250 MG PO TABS: 250.0000 mg | ORAL_TABLET | Freq: Every day | ORAL | 0 refills | Status: DC

## 2019-10-17 NOTE — Progress Notes (Signed)
He presents today for follow-up of his pathology results.  Objective: Vital signs stable alert oriented x3 does demonstrate typical dermatophyte.  Assessment: Onychomycosis.  Plan: Discussed etiology pathology and surgical therapies at this point which discussed topical therapy laser therapy and oral therapy.  He would like to continue with oral therapy.  We discussed pros and cons and risks involved associated with oral therapy he understands and is amenable to it.  He has recently had a complete metabolic panel performed which demonstrated normal values.  Somebody going to get him started on his first 30 days of Lamisil 250 mg tablet 1 p.o. daily and I will follow-up with him in 1 month at which time we will do our own l complete metabolic panel and start 90 days of medicine.

## 2019-11-06 NOTE — Progress Notes (Signed)
MyChart Video Visit    Virtual Visit via Video Note   This visit type was conducted due to national recommendations for restrictions regarding the COVID-19 Pandemic (e.g. social distancing) in an effort to limit this patient's exposure and mitigate transmission in our community. This patient is at least at moderate risk for complications without adequate follow up. This format is felt to be most appropriate for this patient at this time. Physical exam was limited by quality of the video and audio technology used for the visit.   Patient location: Home Provider location: Office    I discussed the limitations of evaluation and management by telemedicine and the availability of in person appointments. The patient expressed understanding and agreed to proceed.   I discussed the limitations of evaluation and management by telemedicine and the availability of in person appointments. The patient expressed understanding and agreed to proceed.  Patient: Juan Salas   DOB: 01-03-1961   59 y.o. Male  MRN: [id number] Visit Date: 11/07/2019  Today's healthcare provider: Trinna Post, PA-C   Chief Complaint  Patient presents with  . Hyperlipidemia  . Sinusitis   Subjective    Sinus Problem    Patient presents today for sinus congestion and headaches. He reports chronic sinus issues including nasal congestion. He reports migraine headaches. Reports post nasal drip that is especially pronounced when he lies down. This has been going on for months to years. He takes sudafed, antihistamines and guafenicen daily. Has tried flonase in the past which lasts for a month and then stops working. He has tried singulair in the past which was not helpful for him.    Lipid/Cholesterol, Follow-up  Last lipid panel Other pertinent labs  Lab Results  Component Value Date   CHOL 321 (H) 08/22/2019   HDL 59 08/22/2019   LDLCALC 239 (H) 08/22/2019   TRIG 128 08/22/2019   CHOLHDL 5.4 (H)  08/22/2019   Lab Results  Component Value Date   ALT 36 08/22/2019   AST 24 08/22/2019   PLT 271 08/22/2019   TSH 1.610 08/22/2019     He was last seen for this 2 months ago.  Management since that visit includes PCP recommended lipitor. Patient declined because his mother had a bad reaction to it.    Symptoms: No chest pain No chest pressure/discomfort  No dyspnea No lower extremity edema  No numbness or tingling of extremity No orthopnea  No palpitations No paroxysmal nocturnal dyspnea  No speech difficulty No syncope   Current diet: not asked Current exercise: none  The ASCVD Risk score (One[place]., et al., 2013) failed to calculate for the following reasons:   The valid total cholesterol range is 130 to 320 mg/dL  ---------------------------------------------------------------------------------------------------     Medications: Outpatient Medications Prior to Visit  Medication Sig  . acetaminophen (TYLENOL) 500 MG tablet Take 500 mg by mouth every 4 (four) hours as needed for moderate pain or headache.   . calcium carbonate (TUMS - DOSED IN MG ELEMENTAL CALCIUM) 500 MG chewable tablet Chew 1 tablet by mouth 2 (two) times daily as needed for indigestion or heartburn.  . chlorpheniramine (CHLOR-TRIMETON) 4 MG tablet Take 2 mg by mouth 2 (two) times daily as needed for allergies.  . Cholecalciferol (VITAMIN D3) 5000 units CAPS Take 5,000 Units by mouth daily.  . cyanocobalamin 2000 MCG tablet Take 2,000 mcg by mouth daily.  . diphenhydrAMINE (BENADRYL) 25 MG tablet Take 25 mg by mouth at bedtime as  needed for sleep.  Nyoka Cowden Tea, Camellia sinensis, (GREEN TEA EXTRACT PO) Take by mouth. Uses this in his tea bags periodically  . L-THEANINE PO Take 200 mg by mouth daily as needed (stress).  Marland Kitchen lisinopril (ZESTRIL) 5 MG tablet Take 1 tablet (5 mg total) by mouth daily.  . Magnesium 250 MG TABS Take 250 mg by mouth at bedtime.  . Multiple Vitamin (MULTIVITAMIN WITH MINERALS)  TABS tablet Take 1 tablet by mouth daily.  . naproxen (NAPROSYN) 250 MG tablet Take 250 mg by mouth as needed.  . Omega-3 Fatty Acids (FISH OIL) 1000 MG CAPS Take 1,000 mg by mouth 2 (two) times daily.  Marland Kitchen terbinafine (LAMISIL) 250 MG tablet Take 1 tablet (250 mg total) by mouth daily.  . Turmeric 500 MG CAPS Take 500 mg by mouth daily.   Facility-Administered Medications Prior to Visit  Medication Dose Route Frequency Provider  . lidocaine (XYLOCAINE) 2 % jelly 1 application  1 application Urethral Once Stoioff, Scott C, MD    Review of Systems    Objective    There were no vitals taken for this visit.   Physical Exam Constitutional:      Appearance: Normal appearance.  Pulmonary:     Effort: Pulmonary effort is normal. No respiratory distress.  Neurological:     Mental Status: He is alert.  Psychiatric:        Mood and Affect: Mood normal.        Behavior: Behavior normal.        Assessment & Plan    1. Sinus congestion Patient has tried daily antihistamine, singulair, nasal spray, etc. Refer to allergy. - Ambulatory referral to Allergy  2. Hyperlipidemia, unspecified hyperlipidemia type  Pt with extremely high cholesterol. Counseled about risk of heart attack and stroke. Statins lower this risk. Patient declines. F/u with PCP  I have spent 25 minutes with this patient, >50% of which was spent on counseling and coordination of care.    No follow-ups on file.    I discussed the assessment and treatment plan with the patient. The patient was provided an opportunity to ask questions and all were answered. The patient agreed with the plan and demonstrated an understanding of the instructions.   The patient was advised to call back or seek an in-person evaluation if the symptoms worsen or if the condition fails to improve as anticipated.   ITrinna Post, PA-C, have reviewed all documentation for this visit. The documentation on 11/08/19 for the exam, diagnosis,  procedures, and orders are all accurate and complete.   Paulene Floor [facility] [phone] (phone) [phone] (fax)  [place]

## 2019-11-07 ENCOUNTER — Telehealth (INDEPENDENT_AMBULATORY_CARE_PROVIDER_SITE_OTHER): Payer: 59 | Admitting: Physician Assistant

## 2019-11-07 DIAGNOSIS — E785 Hyperlipidemia, unspecified: Secondary | ICD-10-CM | POA: Diagnosis not present

## 2019-11-07 DIAGNOSIS — R0981 Nasal congestion: Secondary | ICD-10-CM

## 2019-11-19 ENCOUNTER — Ambulatory Visit (INDEPENDENT_AMBULATORY_CARE_PROVIDER_SITE_OTHER): Payer: 59 | Admitting: Podiatry

## 2019-11-19 ENCOUNTER — Other Ambulatory Visit: Payer: Self-pay

## 2019-11-19 ENCOUNTER — Encounter: Payer: Self-pay | Admitting: Podiatry

## 2019-11-19 DIAGNOSIS — L603 Nail dystrophy: Secondary | ICD-10-CM | POA: Diagnosis not present

## 2019-11-19 DIAGNOSIS — Z79899 Other long term (current) drug therapy: Secondary | ICD-10-CM

## 2019-11-19 MED ORDER — TERBINAFINE HCL 250 MG PO TABS: 250.0000 mg | ORAL_TABLET | Freq: Every day | ORAL | 0 refills | Status: DC

## 2019-11-19 NOTE — Progress Notes (Signed)
He presents today after taking his first 30 days of Lamisil.  States he had no side effects whatsoever denies fever chills nausea vomiting muscle aches pains rashes or itching.  Objective: No change in the nail plates as of yet.  Assessment: Onychomycosis long-term with Lamisil.  Plan: Requesting a complete metabolic panel and we will go ahead and write another 90 days of Lamisil I will follow-up with him in 4 months.  Should his blood work come back abnormal I will notify him immediately.

## 2019-11-20 LAB — COMPREHENSIVE METABOLIC PANEL
ALT: 35 IU/L (ref 0–44)
AST: 32 IU/L (ref 0–40)
Albumin/Globulin Ratio: 2.3 — ABNORMAL HIGH (ref 1.2–2.2)
Albumin: 4.8 g/dL (ref 3.8–4.9)
Alkaline Phosphatase: 68 IU/L (ref 48–121)
BUN/Creatinine Ratio: 14 (ref 10–24)
BUN: 14 mg/dL (ref 8–27)
Bilirubin Total: 0.3 mg/dL (ref 0.0–1.2)
CO2: 25 mmol/L (ref 20–29)
Calcium: 10.1 mg/dL (ref 8.6–10.2)
Chloride: 98 mmol/L (ref 96–106)
Creatinine, Ser: 1.03 mg/dL (ref 0.76–1.27)
GFR calc Af Amer: 91 mL/min/{1.73_m2} (ref >59)
GFR calc non Af Amer: 79 mL/min/{1.73_m2} (ref >59)
Globulin, Total: 2.1 g/dL (ref 1.5–4.5)
Glucose: 98 mg/dL (ref 65–99)
Potassium: 4.9 mmol/L (ref 3.5–5.2)
Sodium: 136 mmol/L (ref 134–144)
Total Protein: 6.9 g/dL (ref 6.0–8.5)

## 2019-12-04 ENCOUNTER — Telehealth: Payer: Self-pay

## 2019-12-04 NOTE — Telephone Encounter (Signed)
-----   Message from Garrel Ridgel, [place] sent at 11/21/2019  6:34 AM EDT ----- Blood work looks good and may continue medication.

## 2019-12-04 NOTE — Telephone Encounter (Signed)
Patient notified of results.

## 2020-02-06 NOTE — Progress Notes (Signed)
Established patient visit   Patient: Juan Salas   DOB: March 20, 1961   59 y.o. Male  MRN: [id number] Visit Date: 02/07/2020  Today's healthcare provider: Vernie Murders, PA   No chief complaint on file.  Subjective    HPI  Lipid/Cholesterol, Follow-up  Last lipid panel Other pertinent labs  Lab Results  Component Value Date   CHOL 321 (H) 08/22/2019   HDL 59 08/22/2019   LDLCALC 239 (H) 08/22/2019   TRIG 128 08/22/2019   CHOLHDL 5.4 (H) 08/22/2019   Lab Results  Component Value Date   ALT 35 11/19/2019   AST 32 11/19/2019   PLT 271 08/22/2019   TSH 1.610 08/22/2019     He was last seen for this 5 months ago.  Management since that visit includes recommend low fat diet and started Atorvastatin 40 mg  He reports good compliance with treatment. He is not having side effects.   Symptoms: No chest pain No chest pressure/discomfort  No dyspnea No lower extremity edema  No numbness or tingling of extremity No orthopnea  No palpitations No paroxysmal nocturnal dyspnea  No speech difficulty No syncope   Current diet: in general, a "healthy" diet   Current exercise: none  The ASCVD Risk score (Cleves., et al., 2013) failed to calculate for the following reasons:   The valid total cholesterol range is 130 to 320 mg/dL  ---------------------------------------------------------------------------------------------------   Past Medical History:  Diagnosis Date  . Anxiety   . Arthritis 12/2017   spinal stenosis  . BPH (benign prostatic hyperplasia) 12/2017  . Headache    monthly migraines often d/t sinus and allergy problems. Naproxen is treatment  . Hematuria 12/2017  . History of kidney stones 1977, 1983,2000   passed on his own. 7 different cycles of stones during youth.  . Hypertension 12/2017   not diagnosed but bp elevated recently d/t stress & pain.  Marland Kitchen Neuromuscular disorder (HCC)    fibromyalgia. diagnosed in 2000  . Nocturia associated with  benign prostatic hyperplasia 12/2017   Past Surgical History:  Procedure Laterality Date  . APPENDECTOMY  2000  . [place]   laminectomy x 2.   . COLONOSCOPY    . COLONOSCOPY WITH PROPOFOL N/A 09/06/2019   Procedure: COLONOSCOPY WITH PROPOFOL;  Surgeon: Jonathon Bellows, MD;  Location: [facility] ENDOSCOPY;  Service: Gastroenterology;  Laterality: N/A;  . CYSTOSCOPY WITH INSERTION OF UROLIFT N/A 01/17/2018   Procedure: CYSTOSCOPY WITH INSERTION OF UROLIFT;  Surgeon: Abbie Sons, MD;  Location: [facility] ORS;  Service: Urology;  Laterality: N/A;  . CYSTOSCOPY WITH INSERTION OF UROLIFT  01/2018  . HERNIA REPAIR Left 2000   double inguinal hernia repairs  . NASAL SEPTUM SURGERY  1978   repair of sinuses also  . TESTICULAR TORSION REPAIR  2005  . TONSILLECTOMY  1970   Social History   Tobacco Use  . Smoking status: Never Smoker  . Smokeless tobacco: Never Used  Vaping Use  . Vaping Use: Never used  Substance Use Topics  . Alcohol use: No  . Drug use: No   Family Status  Relation Name Status  . Mother  Alive  . Father  Alive   Allergies  Allergen Reactions  . Seasonal Ic [Cholestatin] Other (See Comments)    Sinus issues       Medications: Outpatient Medications Prior to Visit  Medication Sig  . acetaminophen (TYLENOL) 500 MG tablet Take 500 mg by mouth every 4 (  four) hours as needed for moderate pain or headache.   . calcium carbonate (TUMS - DOSED IN MG ELEMENTAL CALCIUM) 500 MG chewable tablet Chew 1 tablet by mouth 2 (two) times daily as needed for indigestion or heartburn.  . Cholecalciferol (VITAMIN D3) 5000 units CAPS Take 5,000 Units by mouth daily.  . cyanocobalamin 2000 MCG tablet Take 2,000 mcg by mouth daily.  Nyoka Cowden Tea, Camellia sinensis, (GREEN TEA EXTRACT PO) Take by mouth. Uses this in his tea bags periodically  . L-THEANINE PO Take 200 mg by mouth daily as needed (stress).  Marland Kitchen lisinopril (ZESTRIL) 5 MG tablet Take 1 tablet (5 mg total) by mouth  daily.  . Magnesium 250 MG TABS Take 250 mg by mouth at bedtime.  . Multiple Vitamin (MULTIVITAMIN WITH MINERALS) TABS tablet Take 1 tablet by mouth daily.  . naproxen (NAPROSYN) 250 MG tablet Take 250 mg by mouth as needed.  . Omega-3 Fatty Acids (FISH OIL) 1000 MG CAPS Take 1,000 mg by mouth 2 (two) times daily.  Marland Kitchen terbinafine (LAMISIL) 250 MG tablet Take 1 tablet (250 mg total) by mouth daily.  . Turmeric 500 MG CAPS Take 500 mg by mouth daily.  . [DISCONTINUED] chlorpheniramine (CHLOR-TRIMETON) 4 MG tablet Take 2 mg by mouth 2 (two) times daily as needed for allergies.  . [DISCONTINUED] diphenhydrAMINE (BENADRYL) 25 MG tablet Take 25 mg by mouth at bedtime as needed for sleep.  . [DISCONTINUED] Pseudoephedrine HCl (SUDAFED 24 HOUR PO)    Facility-Administered Medications Prior to Visit  Medication Dose Route Frequency Provider  . lidocaine (XYLOCAINE) 2 % jelly 1 application  1 application Urethral Once Stoioff, Ronda Fairly, MD    Review of Systems  Constitutional: Negative.   Respiratory: Negative.   Cardiovascular: Negative.   Musculoskeletal: Negative.       Objective    There were no vitals taken for this visit. BP Readings from Last 3 Encounters:  02/07/20 (!) 153/91  09/06/19 139/87  08/16/19 (!) 170/108   Wt Readings from Last 3 Encounters:  02/07/20 189 lb 9.6 oz (86 kg)  09/06/19 185 lb (83.9 kg)  08/16/19 190 lb (86.2 kg)     Physical Exam Constitutional:      General: He is not in acute distress.    Appearance: He is well-developed.  HENT:     Head: Normocephalic and atraumatic.     Right Ear: Hearing and tympanic membrane normal.     Left Ear: Hearing and tympanic membrane normal.     Nose: Nose normal.     Mouth/Throat:     Mouth: Mucous membranes are moist.     Pharynx: Oropharynx is clear.  Eyes:     General: Lids are normal. No scleral icterus.       Right eye: No discharge.        Left eye: No discharge.     Conjunctiva/sclera: Conjunctivae  normal.  Cardiovascular:     Rate and Rhythm: Normal rate and regular rhythm.     Heart sounds: Normal heart sounds.  Pulmonary:     Effort: Pulmonary effort is normal. No respiratory distress.     Breath sounds: Normal breath sounds.  Abdominal:     General: Bowel sounds are normal.     Palpations: Abdomen is soft.  Musculoskeletal:        General: Normal range of motion.     Cervical back: Neck supple.  Skin:    Findings: No lesion or rash.  Neurological:  Mental Status: He is alert and oriented to person, place, and time.  Psychiatric:        Speech: Speech normal.        Behavior: Behavior normal.        Thought Content: Thought content normal.     No results found for any visits on 02/07/20.  Assessment & Plan     1. Hyperlipidemia, unspecified hyperlipidemia type Tolerating the Omega-3 Fish Oil daily. Continue low fat diet and recheck labs. - Comprehensive metabolic panel - Lipid panel - TSH  2. Essential hypertension Increased weight by 4 lbs and BP starting to elevate. Tolerating the Lisinopril 5 mg qd and restricting salt, caffeine and ETOH. Recheck labs and follow up pending reports. - CBC with Differential/Platelet - Comprehensive metabolic panel - TSH   No follow-ups on file.      Andres Shad, PA, have reviewed all documentation for this visit. The documentation on 02/07/20 for the exam, diagnosis, procedures, and orders are all accurate and complete.    Vernie Murders, [place] [phone] (phone) [phone] (fax)  [place]

## 2020-02-07 ENCOUNTER — Ambulatory Visit: Payer: 59 | Admitting: Family Medicine

## 2020-02-07 ENCOUNTER — Other Ambulatory Visit: Payer: Self-pay

## 2020-02-07 ENCOUNTER — Encounter: Payer: Self-pay | Admitting: Family Medicine

## 2020-02-07 VITALS — BP 153/91 | HR 79 | Temp 98.2°F | Ht 72.0 in | Wt 189.6 lb

## 2020-02-07 DIAGNOSIS — I10 Essential (primary) hypertension: Secondary | ICD-10-CM

## 2020-02-07 DIAGNOSIS — E785 Hyperlipidemia, unspecified: Secondary | ICD-10-CM | POA: Diagnosis not present

## 2020-02-23 LAB — CBC WITH DIFFERENTIAL/PLATELET
Basophils Absolute: 0 10*3/uL (ref 0.0–0.2)
Basos: 0 %
EOS (ABSOLUTE): 0.1 10*3/uL (ref 0.0–0.4)
Eos: 2 %
Hematocrit: 38.8 % (ref 37.5–51.0)
Hemoglobin: 13.4 g/dL (ref 13.0–17.7)
Immature Grans (Abs): 0 10*3/uL (ref 0.0–0.1)
Immature Granulocytes: 0 %
Lymphocytes Absolute: 1.5 10*3/uL (ref 0.7–3.1)
Lymphs: 34 %
MCH: 32 pg (ref 26.6–33.0)
MCHC: 34.5 g/dL (ref 31.5–35.7)
MCV: 93 fL (ref 79–97)
Monocytes Absolute: 0.4 10*3/uL (ref 0.1–0.9)
Monocytes: 9 %
Neutrophils Absolute: 2.4 10*3/uL (ref 1.4–7.0)
Neutrophils: 55 %
Platelets: 256 10*3/uL (ref 150–450)
RBC: 4.19 x10E6/uL (ref 4.14–5.80)
RDW: 13.1 % (ref 11.6–15.4)
WBC: 4.5 10*3/uL (ref 3.4–10.8)

## 2020-02-23 LAB — COMPREHENSIVE METABOLIC PANEL
ALT: 32 IU/L (ref 0–44)
AST: 29 IU/L (ref 0–40)
Albumin/Globulin Ratio: 2.1 (ref 1.2–2.2)
Albumin: 4.7 g/dL (ref 3.8–4.9)
Alkaline Phosphatase: 58 IU/L (ref 44–121)
BUN/Creatinine Ratio: 14 (ref 10–24)
BUN: 15 mg/dL (ref 8–27)
Bilirubin Total: 0.4 mg/dL (ref 0.0–1.2)
CO2: 25 mmol/L (ref 20–29)
Calcium: 9.6 mg/dL (ref 8.6–10.2)
Chloride: 98 mmol/L (ref 96–106)
Creatinine, Ser: 1.08 mg/dL (ref 0.76–1.27)
GFR calc Af Amer: 86 mL/min/{1.73_m2} (ref >59)
GFR calc non Af Amer: 74 mL/min/{1.73_m2} (ref >59)
Globulin, Total: 2.2 g/dL (ref 1.5–4.5)
Glucose: 91 mg/dL (ref 65–99)
Potassium: 4.8 mmol/L (ref 3.5–5.2)
Sodium: 136 mmol/L (ref 134–144)
Total Protein: 6.9 g/dL (ref 6.0–8.5)

## 2020-02-23 LAB — LIPID PANEL
Chol/HDL Ratio: 4.1 ratio (ref 0.0–5.0)
Cholesterol, Total: 308 mg/dL — ABNORMAL HIGH (ref 100–199)
HDL: 75 mg/dL (ref >39)
LDL Chol Calc (NIH): 220 mg/dL — ABNORMAL HIGH (ref 0–99)
Triglycerides: 83 mg/dL (ref 0–149)
VLDL Cholesterol Cal: 13 mg/dL (ref 5–40)

## 2020-02-23 LAB — TSH: TSH: 1.67 u[IU]/mL (ref 0.450–4.500)

## 2020-02-25 ENCOUNTER — Other Ambulatory Visit: Payer: Self-pay

## 2020-02-25 ENCOUNTER — Telehealth: Payer: 59 | Admitting: Family Medicine

## 2020-02-25 MED ORDER — ATORVASTATIN CALCIUM 20 MG PO TABS
20.0000 mg | ORAL_TABLET | Freq: Every day | ORAL | 3 refills | Status: DC
Start: 2020-02-25 — End: 2020-10-14

## 2020-02-25 NOTE — Progress Notes (Unsigned)
 {  This patient's chart is due for periodic physician review. Please check 'Cosign Required' and forward to your supervising physician.:1}  Established patient visit   Patient: Juan Salas   DOB: [birth year]   59 y.o. Male  MRN: [id number] Visit Date: 02/25/2020  Today's healthcare provider: Vernie Murders, PA   No chief complaint on file.  Subjective    HPI  ***  {Show patient history (optional):23778::" "}   Medications: Outpatient Medications Prior to Visit  Medication Sig  . acetaminophen (TYLENOL) 500 MG tablet Take 500 mg by mouth every 4 (four) hours as needed for moderate pain or headache.   . calcium carbonate (TUMS - DOSED IN MG ELEMENTAL CALCIUM) 500 MG chewable tablet Chew 1 tablet by mouth 2 (two) times daily as needed for indigestion or heartburn.  . Cholecalciferol (VITAMIN D3) 5000 units CAPS Take 5,000 Units by mouth daily.  . cyanocobalamin 2000 MCG tablet Take 2,000 mcg by mouth daily.  Nyoka Cowden Tea, Camellia sinensis, (GREEN TEA EXTRACT PO) Take by mouth. Uses this in his tea bags periodically  . L-THEANINE PO Take 200 mg by mouth daily as needed (stress).  Marland Kitchen lisinopril (ZESTRIL) 5 MG tablet Take 1 tablet (5 mg total) by mouth daily.  . Magnesium 250 MG TABS Take 250 mg by mouth at bedtime.  . Multiple Vitamin (MULTIVITAMIN WITH MINERALS) TABS tablet Take 1 tablet by mouth daily.  . naproxen (NAPROSYN) 250 MG tablet Take 250 mg by mouth as needed.  . Omega-3 Fatty Acids (FISH OIL) 1000 MG CAPS Take 1,000 mg by mouth 2 (two) times daily.  Marland Kitchen terbinafine (LAMISIL) 250 MG tablet Take 1 tablet (250 mg total) by mouth daily.  . Turmeric 500 MG CAPS Take 500 mg by mouth daily.   Facility-Administered Medications Prior to Visit  Medication Dose Route Frequency Provider  . lidocaine (XYLOCAINE) 2 % jelly 1 application  1 application Urethral Once Stoioff, Ronda Fairly, MD    Review of Systems  {Heme  Chem  Endocrine  Serology  Results Review  (optional):23779::" "}  Objective    There were no vitals taken for this visit. {Show previous vital signs (optional):23777::" "}  Physical Exam  ***  No results found for any visits on 02/25/20.  Assessment & Plan     ***  No follow-ups on file.      {provider attestation***:1}   Vernie Murders, [place] [phone] (phone) [phone] (fax)  [place]

## 2020-02-28 ENCOUNTER — Encounter: Payer: Self-pay | Admitting: Family Medicine

## 2020-02-28 ENCOUNTER — Other Ambulatory Visit: Payer: Self-pay

## 2020-02-28 ENCOUNTER — Ambulatory Visit: Payer: 59 | Admitting: Family Medicine

## 2020-02-28 VITALS — BP 164/95 | HR 76 | Temp 98.6°F | Resp 16 | Wt 186.0 lb

## 2020-02-28 DIAGNOSIS — G43109 Migraine with aura, not intractable, without status migrainosus: Secondary | ICD-10-CM

## 2020-02-28 MED ORDER — UBRELVY 50 MG PO TABS: ORAL_TABLET | ORAL | 0 refills | Status: DC

## 2020-02-28 NOTE — Progress Notes (Signed)
Established patient visit   Patient: Juan Salas   DOB: 03-26-1961   59 y.o. Male  MRN: [id number] Visit Date: 02/28/2020  Today's healthcare provider: Vernie Murders, PA   Chief Complaint  Patient presents with   Migraine   Subjective    Migraine  This is a new problem. Episode onset: days ago. The problem has been waxing and waning. The pain is located in the temporal (right side) region. Radiates to: across forehead. The quality of the pain is described as throbbing. The pain is moderate. Associated symptoms include nausea and a visual change (blurred vision). Pertinent negatives include no abdominal pain, fever or vomiting. The symptoms are aggravated by noise and bright light. Treatments tried: Aleve and Tylenol. The treatment provided no relief.   Recurrence of aura and then headache around the right eye at 9:30 am today. Some nausea but no vomiting. Headache described as a throbbing pain around the right eye with extra tearing and some rhinorrhea. Had some loss of central vision with headaches in the past for a short time.  Past Medical History:  Diagnosis Date   Anxiety    Arthritis 12/2017   spinal stenosis   BPH (benign prostatic hyperplasia) 12/2017   Headache    monthly migraines often d/t sinus and allergy problems. Naproxen is treatment   Hematuria 12/2017   History of kidney stones Jul 31, 1975, 1981-07-30   passed on his own. 7 different cycles of stones during youth.   Hypertension 12/2017   not diagnosed but bp elevated recently d/t stress & pain.   Neuromuscular disorder (HCC)    fibromyalgia. diagnosed in 07/31/98   Nocturia associated with benign prostatic hyperplasia 12/2017   Past Surgical History:  Procedure Laterality Date   APPENDECTOMY  2000   [place], 1994   laminectomy x 2.    COLONOSCOPY     COLONOSCOPY WITH PROPOFOL N/A 09/06/2019   Procedure: COLONOSCOPY WITH PROPOFOL;  Surgeon: Jonathon Bellows, MD;  Location[facility]  ENDOSCOPY;  Service: Gastroenterology;  Laterality: N/A;   CYSTOSCOPY WITH INSERTION OF UROLIFT N/A 01/17/2018   Procedure: CYSTOSCOPY WITH INSERTION OF UROLIFT;  Surgeon: Abbie Sons, MD;  Location: [facility] ORS;  Service: Urology;  Laterality: N/A;   CYSTOSCOPY WITH INSERTION OF UROLIFT  01/2018   HERNIA REPAIR Left 31-Jul-1998   double inguinal hernia repairs   NASAL SEPTUM SURGERY  1978   repair of sinuses also   TESTICULAR TORSION REPAIR  07/31/2003   TONSILLECTOMY  1970   Social History   Tobacco Use   Smoking status: Never Smoker   Smokeless tobacco: Never Used  Vaping Use   Vaping Use: Never used  Substance Use Topics   Alcohol use: No   Drug use: No   Family History  Problem Relation Age of Onset   Hypertension Mother    Prostatitis Father    Hypertension Father    Allergies  Allergen Reactions   Seasonal Ic [Cholestatin] Other (See Comments)    Sinus issues       Medications: Outpatient Medications Prior to Visit  Medication Sig   acetaminophen (TYLENOL) 500 MG tablet Take 500 mg by mouth every 4 (four) hours as needed for moderate pain or headache.    atorvastatin (LIPITOR) 20 MG tablet Take 1 tablet (20 mg total) by mouth daily.   calcium carbonate (TUMS - DOSED IN MG ELEMENTAL CALCIUM) 500 MG chewable tablet Chew 1 tablet by mouth 2 (two) times daily as needed for  indigestion or heartburn.   Cholecalciferol (VITAMIN D3) 5000 units CAPS Take 5,000 Units by mouth daily.   cyanocobalamin 2000 MCG tablet Take 2,000 mcg by mouth daily.   Green Tea, Camellia sinensis, (GREEN TEA EXTRACT PO) Take by mouth. Uses this in his tea bags periodically   L-THEANINE PO Take 200 mg by mouth daily as needed (stress).   lisinopril (ZESTRIL) 5 MG tablet Take 1 tablet (5 mg total) by mouth daily.   Magnesium 250 MG TABS Take 250 mg by mouth at bedtime.   Multiple Vitamin (MULTIVITAMIN WITH MINERALS) TABS tablet Take 1 tablet by mouth daily.   naproxen (NAPROSYN)  250 MG tablet Take 250 mg by mouth as needed.   Omega-3 Fatty Acids (FISH OIL) 1000 MG CAPS Take 1,000 mg by mouth 2 (two) times daily.   terbinafine (LAMISIL) 250 MG tablet Take 1 tablet (250 mg total) by mouth daily.   Turmeric 500 MG CAPS Take 500 mg by mouth daily.   Facility-Administered Medications Prior to Visit  Medication Dose Route Frequency Provider   lidocaine (XYLOCAINE) 2 % jelly 1 application  1 application Urethral Once Stoioff, Ronda Fairly, MD    Review of Systems  Constitutional: Negative for appetite change, chills and fever.  HENT: Positive for postnasal drip.   Respiratory: Negative for chest tightness, shortness of breath and wheezing.   Cardiovascular: Negative for chest pain and palpitations.  Gastrointestinal: Positive for nausea. Negative for abdominal pain and vomiting.  Neurological: Positive for headaches.      Objective    BP (!) 164/95 (BP Location: Left Arm, Patient Position: Sitting, Cuff Size: Large)    Pulse 76    Temp 98.6 F (37 C) (Oral)    Resp 16    Wt 186 lb (84.4 kg)    BMI 25.23 kg/m    Physical Exam Constitutional:      General: He is not in acute distress.    Appearance: He is well-developed.  HENT:     Head: Normocephalic and atraumatic.     Right Ear: Hearing and tympanic membrane normal.     Left Ear: Hearing and tympanic membrane normal.     Nose: Nose normal.     Mouth/Throat:     Pharynx: Oropharynx is clear.  Eyes:     General: Lids are normal. No scleral icterus.       Right eye: No discharge.        Left eye: No discharge.     Conjunctiva/sclera: Conjunctivae normal.  Cardiovascular:     Rate and Rhythm: Normal rate.     Heart sounds: Normal heart sounds.  Pulmonary:     Effort: Pulmonary effort is normal. No respiratory distress.     Breath sounds: Normal breath sounds.  Musculoskeletal:        General: Normal range of motion.     Cervical back: Normal range of motion and neck supple.  Skin:    Findings: No  lesion or rash.  Neurological:     Mental Status: He is alert and oriented to person, place, and time.  Psychiatric:        Speech: Speech normal.        Behavior: Behavior normal.        Thought Content: Thought content normal.       No results found for any visits on 02/28/20.  Assessment & Plan    1. Migraine with aura and without status migrainosus, not intractable Recurrence of migraine around the right  eye and temple today at 9:30 am. Some nausea with this throbbing headache. Long history of migraines that may be related to allergies as a trigger. Other triggers are loud noises. Slight photophobia in bright sunlight. Not much relief from Aleve and Tylenol today that normally will stop them. Given Ubrelvy and rest at home in a cool dark room. - Ubrogepant (UBRELVY) 50 MG TABS; Take 1 tablet by mouth today for migraine - may repeat in 3-4 hours if needed.  Dispense: 2 tablet; Refill: 0   No follow-ups on file.      Andres Shad, PA, have reviewed all documentation for this visit. The documentation on 02/28/20 for the exam, diagnosis, procedures, and orders are all accurate and complete.    Vernie Murders, [place] [phone] (phone) [phone] (fax)  [place]

## 2020-02-29 ENCOUNTER — Encounter: Payer: Self-pay | Admitting: Family Medicine

## 2020-03-24 ENCOUNTER — Encounter: Payer: 59 | Admitting: Podiatry

## 2020-03-24 ENCOUNTER — Encounter: Payer: Self-pay | Admitting: Podiatry

## 2020-03-24 ENCOUNTER — Ambulatory Visit (INDEPENDENT_AMBULATORY_CARE_PROVIDER_SITE_OTHER): Payer: 59 | Admitting: Podiatry

## 2020-03-24 ENCOUNTER — Other Ambulatory Visit: Payer: Self-pay

## 2020-03-24 DIAGNOSIS — L603 Nail dystrophy: Secondary | ICD-10-CM | POA: Diagnosis not present

## 2020-03-24 MED ORDER — TERBINAFINE HCL 250 MG PO TABS: 250.0000 mg | ORAL_TABLET | Freq: Every day | ORAL | 0 refills | Status: DC

## 2020-03-24 NOTE — Progress Notes (Signed)
He presents today for follow-up of his nail fungus.  He is completed 120 days and states that they seem to be improving pretty good.  Objective: Vital signs stable alert oriented x3 pulses are palpable.  At this point his nails are growing out very nicely I see no signs of worsening.  Assessment: Long-term therapy with Lamisil for onychomycosis.  Plan: We will start him on an every other day regimen and I will dispense 30 tablets of Lamisil will take 1 every other day for the next 60 days and I will follow-up with him on the third month.

## 2020-03-24 NOTE — Patient Instructions (Signed)
Dr. Hyatt has sent over a refill for Lamisil to your pharmacy today. The instructions on your bottle will say "take 1 tablet daily", however, he would like for you to take one pill every other day. He will follow up with you in 3 months to re-evaluate your toenails. 

## 2020-05-26 ENCOUNTER — Encounter: Payer: Self-pay | Admitting: Podiatry

## 2020-06-25 ENCOUNTER — Encounter: Payer: 59 | Admitting: Podiatry

## 2020-06-30 ENCOUNTER — Encounter: Payer: Self-pay | Admitting: Family Medicine

## 2020-06-30 ENCOUNTER — Other Ambulatory Visit: Payer: Self-pay

## 2020-06-30 DIAGNOSIS — E785 Hyperlipidemia, unspecified: Secondary | ICD-10-CM

## 2020-06-30 DIAGNOSIS — M791 Myalgia, unspecified site: Secondary | ICD-10-CM

## 2020-07-02 LAB — COMPREHENSIVE METABOLIC PANEL
ALT: 43 IU/L (ref 0–44)
AST: 32 IU/L (ref 0–40)
Albumin/Globulin Ratio: 2.2 (ref 1.2–2.2)
Albumin: 4.8 g/dL (ref 3.8–4.9)
Alkaline Phosphatase: 61 IU/L (ref 44–121)
BUN/Creatinine Ratio: 12 (ref 10–24)
BUN: 13 mg/dL (ref 8–27)
Bilirubin Total: 0.3 mg/dL (ref 0.0–1.2)
CO2: 24 mmol/L (ref 20–29)
Calcium: 9.4 mg/dL (ref 8.6–10.2)
Chloride: 102 mmol/L (ref 96–106)
Creatinine, Ser: 1.13 mg/dL (ref 0.76–1.27)
Globulin, Total: 2.2 g/dL (ref 1.5–4.5)
Glucose: 101 mg/dL — ABNORMAL HIGH (ref 65–99)
Potassium: 4.9 mmol/L (ref 3.5–5.2)
Sodium: 141 mmol/L (ref 134–144)
Total Protein: 7 g/dL (ref 6.0–8.5)
eGFR: 74 mL/min/{1.73_m2} (ref >59)

## 2020-07-02 LAB — LIPID PANEL WITH LDL/HDL RATIO
Cholesterol, Total: 192 mg/dL (ref 100–199)
HDL: 74 mg/dL (ref >39)
LDL Chol Calc (NIH): 105 mg/dL — ABNORMAL HIGH (ref 0–99)
LDL/HDL Ratio: 1.4 ratio (ref 0.0–3.6)
Triglycerides: 70 mg/dL (ref 0–149)
VLDL Cholesterol Cal: 13 mg/dL (ref 5–40)

## 2020-07-02 LAB — CK: Total CK: 235 U/L (ref 41–331)

## 2020-07-28 ENCOUNTER — Ambulatory Visit (INDEPENDENT_AMBULATORY_CARE_PROVIDER_SITE_OTHER): Payer: 59 | Admitting: Podiatry

## 2020-07-28 ENCOUNTER — Other Ambulatory Visit: Payer: Self-pay

## 2020-07-28 ENCOUNTER — Encounter: Payer: Self-pay | Admitting: Podiatry

## 2020-07-28 DIAGNOSIS — L603 Nail dystrophy: Secondary | ICD-10-CM

## 2020-07-28 MED ORDER — TERBINAFINE HCL 250 MG PO TABS: 250.0000 mg | ORAL_TABLET | Freq: Every day | ORAL | 0 refills | Status: DC

## 2020-07-28 NOTE — Progress Notes (Signed)
He presents today after having completed his 120 days +1 round of every other day dosing.  States that the toenails are just about grown out he is happy with the outcome.  Objective: Vital signs are stable alert oriented x3.  Pulses are palpable toenails appear to be growing out very nicely his other nails are cut short and filed thin as I had recommended in the nails themselves are healing very nicely.  Assessment: I feel that his onychomycosis is doing much better.  Plan: At this point I think 1 more dose of 30 tablets every other day should take care of the remainder of his nails.

## 2020-08-12 ENCOUNTER — Other Ambulatory Visit: Payer: Self-pay | Admitting: Family Medicine

## 2020-08-12 DIAGNOSIS — I10 Essential (primary) hypertension: Secondary | ICD-10-CM

## 2020-08-12 MED ORDER — LISINOPRIL 5 MG PO TABS: 5.0000 mg | ORAL_TABLET | Freq: Every day | ORAL | 0 refills | Status: DC

## 2020-08-12 NOTE — Telephone Encounter (Signed)
Medication Refill - Medication: Lisinopril   Has the patient contacted their pharmacy? Yes.   Pt states that he has no refills. Pt only has one pill left.  Please advise.  (Agent: If no, request that the patient contact the pharmacy for the refill.) (Agent: If yes, when and what did the pharmacy advise?)  Preferred Pharmacy (with phone number or street name):  [place], [place] [facility] [address] [place] [address]  Phone: [phone] Fax: [phone]  Hours: Not open 24 hours     Agent: Please be advised that RX refills may take up to 3 business days. We ask that you follow-up with your pharmacy.

## 2020-08-25 ENCOUNTER — Encounter: Payer: Self-pay | Admitting: Family Medicine

## 2020-09-04 ENCOUNTER — Telehealth: Payer: Self-pay

## 2020-09-04 NOTE — Telephone Encounter (Signed)
Copied from [place] [id number]. Topic: Appointment Scheduling - Scheduling Inquiry for Clinic >> Sep 04, 2020 12:49 PM Greggory Keen D wrote: Reason for CRM: Pt called saying he has started having the cluster headaches again started last Saturday.  Nothing available until June for an appt.   CB#  [phone]

## 2020-09-04 NOTE — Telephone Encounter (Signed)
Did the Juan Salas help with the headaches back in the Fall? It can be refilled or may need a prednisone taper for 6 days, then recheck as needed.

## 2020-09-05 NOTE — Telephone Encounter (Signed)
Did the Ubrelvy help with the headaches back in the Fall? It can be refilled or may need a prednisone taper for 6 days, then recheck as needed.

## 2020-09-05 NOTE — Telephone Encounter (Signed)
Pt called and is requesting to have an update since he has not heard a response. Please advise.

## 2020-09-08 ENCOUNTER — Other Ambulatory Visit: Payer: Self-pay | Admitting: Family Medicine

## 2020-09-08 DIAGNOSIS — G43109 Migraine with aura, not intractable, without status migrainosus: Secondary | ICD-10-CM

## 2020-09-08 MED ORDER — UBRELVY 50 MG PO TABS: ORAL_TABLET | ORAL | 3 refills | Status: DC

## 2020-09-08 NOTE — Telephone Encounter (Signed)
Pt. States he would like Ubrelvy sent to his [facility]. States prednisone "causes all sorts of side effects." States he is having "cluster headaches." Please advise.

## 2020-09-08 NOTE — Telephone Encounter (Signed)
Sent refill of #10 with 3 RF to the pharmacy.

## 2020-09-08 NOTE — Telephone Encounter (Signed)
LMTCB, [facility] Triage Nurse may give patient results  

## 2020-09-17 ENCOUNTER — Telehealth: Payer: Self-pay

## 2020-09-17 DIAGNOSIS — G43109 Migraine with aura, not intractable, without status migrainosus: Secondary | ICD-10-CM

## 2020-09-17 NOTE — Telephone Encounter (Signed)
Copied from [place] [phone]. Topic: General - Other >> Sep 17, 2020  2:55 PM Tessa Lerner A wrote: Reason for CRM: Patient shares that they have experienced a headache for roughly 19 days and would like to be contacted by staff to discuss alternative treatments   Patient shares that the headache has varied in intensity but the past two days since 09/15/20 have been particularly concerning   Patient shares that their previous prescription for UBRELVY was relatively ineffective   Please contact to further advise when possible

## 2020-09-22 ENCOUNTER — Encounter: Payer: 59 | Admitting: Family Medicine

## 2020-09-23 NOTE — Telephone Encounter (Signed)
Patient advised. Referral placed  

## 2020-09-23 NOTE — Telephone Encounter (Signed)
Need to try to set up neurology referral again for these persistent headaches/migraines.

## 2020-10-06 ENCOUNTER — Telehealth: Payer: Self-pay

## 2020-10-06 NOTE — Telephone Encounter (Signed)
Copied from [place] [phone]. Topic: Appointment Scheduling - Scheduling Inquiry for Clinic >> Oct 06, 2020 10:44 AM Bayard Beaver wrote: Reason for OIZ:[id number] called has video appt scheduled for 06/28, wants to be called if sooner appt is available. Please call back if possible.

## 2020-10-14 ENCOUNTER — Other Ambulatory Visit: Payer: Self-pay

## 2020-10-14 ENCOUNTER — Telehealth (INDEPENDENT_AMBULATORY_CARE_PROVIDER_SITE_OTHER): Payer: 59 | Admitting: Family Medicine

## 2020-10-14 ENCOUNTER — Encounter: Payer: Self-pay | Admitting: Family Medicine

## 2020-10-14 VITALS — BP 150/90 | HR 84 | Wt 191.0 lb

## 2020-10-14 DIAGNOSIS — I10 Essential (primary) hypertension: Secondary | ICD-10-CM | POA: Diagnosis not present

## 2020-10-14 DIAGNOSIS — G43109 Migraine with aura, not intractable, without status migrainosus: Secondary | ICD-10-CM | POA: Insufficient documentation

## 2020-10-14 DIAGNOSIS — G44029 Chronic cluster headache, not intractable: Secondary | ICD-10-CM | POA: Diagnosis not present

## 2020-10-14 MED ORDER — VERAPAMIL HCL ER 180 MG PO TBCR
180.0000 mg | EXTENDED_RELEASE_TABLET | Freq: Every day | ORAL | 1 refills | Status: DC
Start: 2020-10-14 — End: 2021-09-18

## 2020-10-14 MED ORDER — UBRELVY 50 MG PO TABS: ORAL_TABLET | ORAL | 3 refills | Status: DC

## 2020-10-14 NOTE — Patient Instructions (Signed)
Cluster Headache Cluster headaches hurt a lot. They normally happen on one side of your head, but they may switch sides. Often, cluster headaches: Cause a lot of pain. Happen for weeks to months. Last from 15 minutes to 3 hours. Happen at the same time each day. Happen at night. Happen many times a day. Happen more often in the fall and springtime. What are the causes? The exact cause is not known. They are not usually caused by foods, changes in body chemicals (hormonalchanges), or stress. What increases the risk? Being a male between the ages of 20-50 years old. Smoking or using products that contain nicotine or tobacco. Having elevated levels of body chemical called histamine. This can happen in people who have allergies. Taking certain medicines that cause blood vessels to expand. Having a parent or brother or sister who has cluster headaches. What are the signs or symptoms? Very bad pain on one side of the head that begins behind or around your eye but may spread to your face, head, and neck. Feeling like you may vomit (nauseous). Being sensitive to light. Runny nose and stuffy nose. Swelling of the forehead or face on the affected side. Eye problems. This might include a droopy or swollen eyelid, eye redness, or tearing on the affected side. Feeling restless or upset. Pale skin or a flushed face. How is this treated? Medicines. Oxygen that is breathed in through a mask. Follow these instructions at home: Headache diary Keep a headache diary as told by your doctor. Doing this can help you and your doctor figure out what triggers your headaches. In your headache diary, include information about: The time of day that your headache started and what you were doing when it began. How long your headache lasted. Where your pain started and whether it moved to other areas. The type of pain. Your level of pain. Use a pain scale and rate the pain with a number from 1 (mild) up to 10  (very bad). The treatment that you used, and any change in symptoms after treatment.  Medicines Take over-the-counter and prescription medicines only as told by your health care provider. Ask your doctor if the medicine prescribed to you: Requires you to avoid driving or using machinery. Can cause trouble pooping (constipation). You may need to take these actions to prevent or treat trouble pooping: Drink enough fluid to keep your pee (urine) pale yellow. Take over-the-counter or prescription medicines. Eat foods that are high in fiber. These include beans, whole grains, and fresh fruits and vegetables. Limit foods that are high in fat and processed sugars. These include fried or sweet foods. Lifestyle  Go to bed at the same time each night. Get the same amount of sleep every night. Get 7-9 hours of sleep each night, or the amount recommended by your doctor. Limit or manage stress. Exercise regularly. Exercise for at least 30 minutes, 5 times each week. Eat a healthy diet. Avoid any foods that you know may trigger your headaches. Do not drink alcohol. Do not use any products that contain nicotine or tobacco, such as cigarettes, e-cigarettes, and chewing tobacco. If you need help quitting, ask your doctor.  General instructions Use oxygen as told by your doctor. Keep all follow-up visits as told by your health care provider. This is important. Contact a doctor if: Your headaches: Change. Get worse. Happen more often. Your medicines or oxygen are not helping. Get help right away if: You faint. You get weak or lose feeling (have   numbness) on one side of your body or face. You see two of everything (double vision). You feel you may vomit or you vomit and it does stop after many hours. You have trouble with your balance or with walking. You have trouble talking. You have neck pain or stiffness and you have a fever. Summary Cluster headaches hurt a lot. Keep a headache diary. Do  not drink alcohol. Medicines and oxygen may help you feel better. This information is not intended to replace advice given to you by your health care provider. Make sure you discuss any questions you have with your healthcare provider. Document Revised: 12/07/2019 Document Reviewed: 05/10/2019 Elsevier Patient Education  2022 Elsevier Inc.  

## 2020-10-14 NOTE — Progress Notes (Signed)
MyChart Video Visit    Virtual Visit via Video Note   This visit type was conducted due to national recommendations for restrictions regarding the COVID-19 Pandemic (e.g. social distancing) in an effort to limit this patient's exposure and mitigate transmission in our community. This patient is at least at moderate risk for complications without adequate follow up. This format is felt to be most appropriate for this patient at this time. Physical exam was limited by quality of the video and audio technology used for the visit.    Patient location: home Provider location: [place] involved in the visit: patient, provider  I discussed the limitations of evaluation and management by telemedicine and the availability of in person appointments. The patient expressed understanding and agreed to proceed.  Patient: Juan Salas   DOB: 18-Feb-1961   60 y.o. Male  MRN: [id number] Visit Date: 10/14/2020  Today's healthcare provider: Lavon Paganini, MD   Chief Complaint  Patient presents with   Headache   Subjective    HPI HPI     Headache   Location Descriptor In the temporal area [facility] and behind right eye).  Characterized as throbbing.  This started 6 weeks.  Occurs daily.  It is worse at random times.  Triggers: Being exposed to cold air.  Since onset it is gradually worsening.  Treatments Attempted: Excedrin and Tylenol .  Response to treatment was no improvement.  Associated symptoms include eye pain, vision loss (patient states that he had two episodes where he felt like he loss his vision), double vision, headache, nausea, visual disturbance and scalp tenderness.  Negative for redness, blurred vision, dim vision, transient vision loss, tearing, pain with eye movement, congestion, postnasal drip, vomiting, photophobia, flashing lights, aura, numbness, tingling, weakness, abnormal speech, abnormal gait, imbalance, dizziness, confusion, jaw  claudication, shoulder/hip pain, fever, chills, weight loss, fatigue, neck stiffness, neck pain, rash, unequal pupils, droopy lid, trauma, pulsating sound, motion sickness and ice cream headaches.      Last edited by Minette Headland, CMA on 10/14/2020  9:20 AM.      Had them for 3 weeks back in the fall. Melburn Hake last year and it works sometimes, but not others. Has ongoing migraines for many years with aura. Has eye watering with these headaches.  Has upcoming appt with Neurology Has never taken controller med    Medications: Outpatient Medications Prior to Visit  Medication Sig   acetaminophen (TYLENOL) 500 MG tablet Take 500 mg by mouth every 4 (four) hours as needed for moderate pain or headache.    calcium carbonate (TUMS - DOSED IN MG ELEMENTAL CALCIUM) 500 MG chewable tablet Chew 1 tablet by mouth 2 (two) times daily as needed for indigestion or heartburn.   Cholecalciferol (VITAMIN D3) 5000 units CAPS Take 5,000 Units by mouth daily.   cyanocobalamin 2000 MCG tablet Take 2,000 mcg by mouth daily.   Green Tea, Camellia sinensis, (GREEN TEA EXTRACT PO) Take by mouth. Uses this in his tea bags periodically   L-THEANINE PO Take 200 mg by mouth daily as needed (stress).   lisinopril (ZESTRIL) 5 MG tablet Take 1 tablet (5 mg total) by mouth daily.   Magnesium 250 MG TABS Take 250 mg by mouth at bedtime.   Multiple Vitamin (MULTIVITAMIN WITH MINERALS) TABS tablet Take 1 tablet by mouth daily.   naproxen (NAPROSYN) 250 MG tablet Take 250 mg by mouth as needed.   Omega-3 Fatty Acids (FISH OIL) 1000 MG CAPS  Take 1,000 mg by mouth 2 (two) times daily.   terbinafine (LAMISIL) 250 MG tablet Take 1 tablet (250 mg total) by mouth daily.   Turmeric 500 MG CAPS Take 500 mg by mouth daily.   [DISCONTINUED] Ubrogepant (UBRELVY) 50 MG TABS Take 1 tablet by mouth today for migraine - may repeat in 3-4 hours if needed.   [DISCONTINUED] atorvastatin (LIPITOR) 20 MG tablet Take 1 tablet (20 mg  total) by mouth daily.   Facility-Administered Medications Prior to Visit  Medication Dose Route Frequency Provider   lidocaine (XYLOCAINE) 2 % jelly 1 application  1 application Urethral Once Stoioff, Ronda Fairly, MD    Review of Systems - per HPI    Objective    BP (!) 150/90   Pulse 84   Wt 191 lb (86.6 kg)   BMI 25.90 kg/m    Physical Exam Constitutional:      General: He is not in acute distress.    Appearance: He is well-developed.  Pulmonary:     Effort: Pulmonary effort is normal. No respiratory distress.  Neurological:     Mental Status: He is alert. Mental status is at baseline.     Cranial Nerves: No dysarthria or facial asymmetry.  Psychiatric:        Mood and Affect: Mood normal.        Behavior: Behavior normal.       Assessment & Plan     Problem List Items Addressed This Visit       Cardiovascular and Mediastinum   Migraine with aura and without status migrainosus, not intractable    Migraines with aura and cluster headaches Needs prophylactic medication Start Verapamil 180 mg daily and titrate to 240mg  daily as possible Use ubrelvy prn  Avoid overuse of NSAIDs/Excedrin F/u with Neuro as scheduled       Relevant Medications   verapamil (CALAN-SR) 180 MG CR tablet   Ubrogepant (UBRELVY) 50 MG TABS   Primary hypertension    Reports that it is typically elevated Discussed that verapamil can help with this too May be able to d/c lisinopril in the future F/u with PCP in 26m       Relevant Medications   verapamil (CALAN-SR) 180 MG CR tablet     Nervous and Auditory   Chronic cluster headache, not intractable - Primary    Symptoms seem c/w cluster headaches Needs prophylactic medication Start Verapamil 180 mg daily and titrate to 240mg  daily as possible Use ubrelvy prn  Avoid overuse of NSAIDs/Excedrin Offered prednisone burst and taper but patient declines due to SEs F/u with Neuro as scheduled       Relevant Medications   verapamil  (CALAN-SR) 180 MG CR tablet   Ubrogepant (UBRELVY) 50 MG TABS     Return in about 4 weeks (around 11/11/2020) for chronic disease f/u, as scheduled with PCP.     I discussed the assessment and treatment plan with the patient. The patient was provided an opportunity to ask questions and all were answered. The patient agreed with the plan and demonstrated an understanding of the instructions.   The patient was advised to call back or seek an in-person evaluation if the symptoms worsen or if the condition fails to improve as anticipated.   I, Lavon Paganini, MD, have reviewed all documentation for this visit. The documentation on 10/14/20 for the exam, diagnosis, procedures, and orders are all accurate and complete.   Manna Gose, Dionne Bucy, MD, MPH [facility]

## 2020-10-14 NOTE — Assessment & Plan Note (Signed)
Migraines with aura and cluster headaches Needs prophylactic medication Start Verapamil 180 mg daily and titrate to 240mg  daily as possible Use ubrelvy prn  Avoid overuse of NSAIDs/Excedrin F/u with Neuro as scheduled

## 2020-10-14 NOTE — Assessment & Plan Note (Signed)
Symptoms seem c/w cluster headaches Needs prophylactic medication Start Verapamil 180 mg daily and titrate to 240mg  daily as possible Use ubrelvy prn  Avoid overuse of NSAIDs/Excedrin Offered prednisone burst and taper but patient declines due to SEs F/u with Neuro as scheduled

## 2020-10-14 NOTE — Assessment & Plan Note (Signed)
Reports that it is typically elevated Discussed that verapamil can help with this too May be able to d/c lisinopril in the future F/u with PCP in 75m

## 2020-10-15 ENCOUNTER — Encounter: Payer: Self-pay | Admitting: Family Medicine

## 2020-10-16 ENCOUNTER — Telehealth: Payer: Self-pay

## 2020-10-16 NOTE — Telephone Encounter (Signed)
PA for Juan Salas done today.  Outcome: Pending.

## 2020-10-23 NOTE — Telephone Encounter (Signed)
He can try the 100s and even try to split them if he wants. Ok to give those as samples. He may find that the higher dose even helps a bit more

## 2020-11-11 ENCOUNTER — Encounter: Payer: 59 | Admitting: Family Medicine

## 2020-11-11 ENCOUNTER — Other Ambulatory Visit (HOSPITAL_COMMUNITY): Payer: Self-pay | Admitting: Internal Medicine

## 2020-11-11 ENCOUNTER — Other Ambulatory Visit: Payer: Self-pay | Admitting: Internal Medicine

## 2020-11-11 DIAGNOSIS — N5089 Other specified disorders of the male genital organs: Secondary | ICD-10-CM

## 2020-11-12 ENCOUNTER — Ambulatory Visit (INDEPENDENT_AMBULATORY_CARE_PROVIDER_SITE_OTHER): Payer: 59 | Admitting: Podiatry

## 2020-11-12 ENCOUNTER — Other Ambulatory Visit: Payer: Self-pay

## 2020-11-12 ENCOUNTER — Encounter: Payer: Self-pay | Admitting: Podiatry

## 2020-11-12 DIAGNOSIS — S90111A Contusion of right great toe without damage to nail, initial encounter: Secondary | ICD-10-CM

## 2020-11-12 DIAGNOSIS — L603 Nail dystrophy: Secondary | ICD-10-CM

## 2020-11-12 NOTE — Progress Notes (Signed)
He presents today for follow-up of his nail fungus.  States that the oral medication has cleared up completely.  He states that he injured the toe as he refers to his right hallux while tubing.  He states that still looks better today than he did previously.  Objective: Vital signs are stable alert and oriented x3.  Pulses are palpable.  His nail plates are [id number] resolved from previous evaluations.  He has some mild ecchymosis around the proximal nail fold but it looks like only bruising it does not look like anything bad just a contusion.  Assessment: Well-healing onychomycosis long-term use of Lamisil for over a year.  Contusion hallux right.  Plan: Follow-up with me on an as-needed basis or with any reoccurrence.

## 2020-11-25 ENCOUNTER — Other Ambulatory Visit: Payer: Self-pay

## 2020-11-25 ENCOUNTER — Ambulatory Visit
Admission: RE | Admit: 2020-11-25 | Discharge: 2020-11-25 | Disposition: A | Discharge status: Home / Self Care | Payer: 59 | Source: Ambulatory Visit | Attending: Internal Medicine | Admitting: Internal Medicine

## 2020-11-25 DIAGNOSIS — N5089 Other specified disorders of the male genital organs: Secondary | ICD-10-CM

## 2020-11-27 ENCOUNTER — Other Ambulatory Visit: Payer: Self-pay | Admitting: Neurology

## 2020-11-27 DIAGNOSIS — R519 Headache, unspecified: Secondary | ICD-10-CM

## 2020-12-03 ENCOUNTER — Ambulatory Visit
Admission: RE | Admit: 2020-12-03 | Discharge: 2020-12-03 | Disposition: A | Discharge status: Home / Self Care | Payer: 59 | Source: Ambulatory Visit | Attending: Neurology | Admitting: Neurology

## 2020-12-03 ENCOUNTER — Other Ambulatory Visit: Payer: Self-pay

## 2020-12-03 DIAGNOSIS — R519 Headache, unspecified: Secondary | ICD-10-CM | POA: Insufficient documentation

## 2020-12-03 MED ORDER — GADOBUTROL 1 MMOL/ML IV SOLN
7.5000 mL | Freq: Once | INTRAVENOUS | Status: AC | PRN
  Administered 2020-12-03: 7.5 mL via INTRAVENOUS

## 2020-12-04 ENCOUNTER — Ambulatory Visit: Payer: 59 | Admitting: Urology

## 2021-09-18 ENCOUNTER — Ambulatory Visit: Payer: Commercial Managed Care - HMO | Admitting: Urology

## 2021-09-18 ENCOUNTER — Encounter: Payer: Self-pay | Admitting: Urology

## 2021-09-18 VITALS — BP 147/88 | HR 93 | Ht 72.0 in | Wt 188.0 lb

## 2021-09-18 DIAGNOSIS — N433 Hydrocele, unspecified: Secondary | ICD-10-CM | POA: Diagnosis not present

## 2021-09-18 NOTE — Progress Notes (Signed)
09/18/2021 8:58 AM   Juan Salas 05-28-1960 [id number]  Referring provider: Rusty Aus, MD [facility] [place],  [place] [address]  Chief Complaint  Patient presents with   Hydrocele    HPI: Juan Salas is a 61 y.o. male who presents for evaluation of a left hydrocele.  Initially noted left hemiscrotal swelling July 2022.  Scrotal sonogram performed remarkable for a moderate left hydrocele He had minimal discomfort at that time however over the last few months he has noted increased size and complains of dull scrotal discomfort with changes in position.  His discomfort is bothersome He is status post UroLift and 2017/07/04 and continues to do well from a voiding perspective   PMH: Past Medical History:  Diagnosis Date   Anxiety    Arthritis 12/2017   spinal stenosis   BPH (benign prostatic hyperplasia) 12/2017   Headache    monthly migraines often d/t sinus and allergy problems. Naproxen is treatment   Hematuria 12/2017   History of kidney stones Jul 05, 1975, 07/04/81   passed on his own. 7 different cycles of stones during youth.   Hypertension 12/2017   not diagnosed but bp elevated recently d/t stress & pain.   Neuromuscular disorder (HCC)    fibromyalgia. diagnosed in 07/05/1998   Nocturia associated with benign prostatic hyperplasia 12/2017    Surgical History: Past Surgical History:  Procedure Laterality Date   APPENDECTOMY  2000   [place], 1994   laminectomy x 2.    COLONOSCOPY     COLONOSCOPY WITH PROPOFOL N/A 09/06/2019   Procedure: COLONOSCOPY WITH PROPOFOL;  Surgeon: Jonathon Bellows, MD;  Location: [facility] ENDOSCOPY;  Service: Gastroenterology;  Laterality: N/A;   CYSTOSCOPY WITH INSERTION OF UROLIFT N/A 01/17/2018   Procedure: CYSTOSCOPY WITH INSERTION OF UROLIFT;  Surgeon: Abbie Sons, MD;  Location: [facility] ORS;  Service: Urology;  Laterality: N/A;   CYSTOSCOPY WITH INSERTION OF UROLIFT  01/2018    HERNIA REPAIR Left 07-05-98   double inguinal hernia repairs   NASAL SEPTUM SURGERY  1978   repair of sinuses also   TESTICULAR TORSION REPAIR  2003-07-05   TONSILLECTOMY  1970    Home Medications:  Allergies as of 09/18/2021       Reactions   Atorvastatin Other (See Comments)   Joint pain Joint pain   Prednisone Other (See Comments)   After 3 days agitation and sleeplessness After 3 days agitation and sleeplessness   Verapamil Nausea Only   Other reaction(s): Headache insomnia insomnia   Cholestatin    Other reaction(s): Other (See Comments) Sinus issues   Metoprolol Palpitations   Per patient, can't tolerate. Per patient, can't tolerate.   Seasonal Ic [cholestatin] Other (See Comments)   Sinus issues        Medication List        Accurate as of September 18, 2021  8:58 AM. If you have any questions, ask your nurse or doctor.          STOP taking these medications    terbinafine 250 MG tablet Commonly known as: LamISIL Stopped by: Abbie Sons, MD   verapamil 180 MG CR tablet Commonly known as: CALAN-SR Stopped by: Abbie Sons, MD       TAKE these medications    acetaminophen 500 MG tablet Commonly known as: TYLENOL Take 500 mg by mouth every 4 (four) hours as needed for moderate pain or headache.   calcium carbonate 500  MG chewable tablet Commonly known as: TUMS - dosed in mg elemental calcium Chew 1 tablet by mouth 2 (two) times daily as needed for indigestion or heartburn.   cyanocobalamin 2000 MCG tablet Take 2,000 mcg by mouth daily.   Fish Oil 1000 MG Caps Take 1,000 mg by mouth 2 (two) times daily.   gabapentin 100 MG capsule Commonly known as: NEURONTIN Take 100-200 mg by mouth at bedtime.   GREEN TEA EXTRACT PO Take by mouth. Uses this in his tea bags periodically   ipratropium 0.06 % nasal spray Commonly known as: ATROVENT SMARTSIG:2 Spray(s) Both Nares Twice Daily PRN   L-THEANINE PO Take 200 mg by mouth daily as needed (stress).    lisinopril 5 MG tablet Commonly known as: ZESTRIL Take by mouth. What changed: Another medication with the same name was removed. Continue taking this medication, and follow the directions you see here. Changed by: Abbie Sons, MD   Magnesium 250 MG Tabs Take 250 mg by mouth at bedtime.   multivitamin with minerals Tabs tablet Take 1 tablet by mouth daily.   naproxen 250 MG tablet Commonly known as: NAPROSYN Take 250 mg by mouth as needed.   rosuvastatin 10 MG tablet Commonly known as: CRESTOR Take 10 mg by mouth daily.   tadalafil 20 MG tablet Commonly known as: CIALIS Take 20 mg by mouth daily as needed.   Turmeric 500 MG Caps Take 500 mg by mouth daily.   Ubrelvy 50 MG Tabs Generic drug: Ubrogepant Take 1 tablet by mouth today for migraine - may repeat in 3-4 hours if needed.   Vitamin D3 125 MCG (5000 UT) Caps Take 5,000 Units by mouth daily.        Allergies:  Allergies  Allergen Reactions   Atorvastatin Other (See Comments)    Joint pain Joint pain    Prednisone Other (See Comments)    After 3 days agitation and sleeplessness After 3 days agitation and sleeplessness    Verapamil Nausea Only    Other reaction(s): Headache insomnia insomnia    Cholestatin     Other reaction(s): Other (See Comments) Sinus issues   Metoprolol Palpitations    Per patient, can't tolerate. Per patient, can't tolerate.    Seasonal Ic [Cholestatin] Other (See Comments)    Sinus issues    Family History: Family History  Problem Relation Age of Onset   Hypertension Mother    Prostatitis Father    Hypertension Father     Social History:  reports that he has never smoked. He has never used smokeless tobacco. He reports that he does not drink alcohol and does not use drugs.   Physical Exam: BP (!) 147/88   Pulse 93   Ht 6' (1.829 m)   Wt 188 lb (85.3 kg)   BMI 25.50 kg/m   Constitutional:  Alert and oriented, No acute distress. HEENT: [place] AT, moist  mucus membranes.  Trachea midline, no masses. Cardiovascular: No clubbing, cyanosis, or edema. Respiratory: Normal respiratory effort, no increased work of breathing. GI: Abdomen is soft, nontender, nondistended, no abdominal masses GU: Moderate left hydrocele.  Right testis palpably normal.  Left testis not palpable secondary to hydrocele.  No evidence of hernia Skin: No rashes, bruises or suspicious lesions. Neurologic: Grossly intact, no focal deficits, moving all 4 extremities. Psychiatric: Normal mood and affect.   Pertinent Imaging: Scrotal ultrasound images were personally reviewed and interpreted  Assessment & Plan:    1.  Left hydrocele Bothersome symptoms Management  options were discussed including observation, aspiration and hydrocelectomy.  The high recurrence rate of aspiration was discussed He would like to proceed with hydrocelectomy.  The procedure was discussed in detail including a postoperative drain for 2-3 days.  Potential complications were reviewed including bleeding/hematoma, infection/abscess either which could potentially require a additional procedure for treatment.  The recurrence rate is low.  All questions were answered and he desires to schedule    Abbie Sons, MD  [facility] [address] [place], [place] [address] [phone]

## 2021-09-18 NOTE — H&P (View-Only) (Signed)
09/18/2021 8:58 AM   Juan Salas 1960-11-05 [id number]  Referring provider: Rusty Aus, MD [facility] [place],  [place] [address]  Chief Complaint  Patient presents with   Hydrocele    HPI: Juan Salas is a 61 y.o. male who presents for evaluation of a left hydrocele.  Initially noted left hemiscrotal swelling July 2022.  Scrotal sonogram performed remarkable for a moderate left hydrocele He had minimal discomfort at that time however over the last few months he has noted increased size and complains of dull scrotal discomfort with changes in position.  His discomfort is bothersome He is status post UroLift and 2017/07/06 and continues to do well from a voiding perspective   PMH: Past Medical History:  Diagnosis Date   Anxiety    Arthritis 12/2017   spinal stenosis   BPH (benign prostatic hyperplasia) 12/2017   Headache    monthly migraines often d/t sinus and allergy problems. Naproxen is treatment   Hematuria 12/2017   History of kidney stones 07-07-75, Jul 06, 1981   passed on his own. 7 different cycles of stones during youth.   Hypertension 12/2017   not diagnosed but bp elevated recently d/t stress & pain.   Neuromuscular disorder (HCC)    fibromyalgia. diagnosed in 07-Jul-1998   Nocturia associated with benign prostatic hyperplasia 12/2017    Surgical History: Past Surgical History:  Procedure Laterality Date   APPENDECTOMY  2000   [place], 1994   laminectomy x 2.    COLONOSCOPY     COLONOSCOPY WITH PROPOFOL N/A 09/06/2019   Procedure: COLONOSCOPY WITH PROPOFOL;  Surgeon: Jonathon Bellows, MD;  Location: [facility] END[facility]COPY;  Service: Gastroenterology;  Laterality: N/A;   CYSTOSCOPY WITH INSERTION OF UROLIFT N/A 01/17/2018   Procedure: CYSTOSCOPY WITH INSERTION OF UROLIFT;  Surgeon: Abbie Sons, MD;  Location: [facility] ORS;  Service: Urology;  Laterality: N/A;   CYSTOSCOPY WITH INSERTION OF UROLIFT  01/2018    HERNIA REPAIR Left July 07, 1998   double inguinal hernia repairs   NASAL SEPTUM SURGERY  1978   repair of sinuses also   TESTICULAR TORSION REPAIR  07/07/2003   TONSILLECTOMY  1970    Home Medications:  Allergies as of 09/18/2021       Reactions   Atorvastatin Other (See Comments)   Joint pain Joint pain   Prednisone Other (See Comments)   After 3 days agitation and sleeplessness After 3 days agitation and sleeplessness   Verapamil Nausea Only   Other reaction(s): Headache insomnia insomnia   Cholestatin    Other reaction(s): Other (See Comments) Sinus issues   Metoprolol Palpitations   Per patient, can't tolerate. Per patient, can't tolerate.   Seasonal Ic [cholestatin] Other (See Comments)   Sinus issues        Medication List        Accurate as of September 18, 2021  8:58 AM. If you have any questions, ask your nurse or doctor.          STOP taking these medications    terbinafine 250 MG tablet Commonly known as: LamISIL Stopped by: Abbie Sons, MD   verapamil 180 MG CR tablet Commonly known as: CALAN-SR Stopped by: Abbie Sons, MD       TAKE these medications    acetaminophen 500 MG tablet Commonly known as: TYLENOL Take 500 mg by mouth every 4 (four) hours as needed for moderate pain or headache.   calcium carbonate 500  MG chewable tablet Commonly known as: TUMS - dosed in mg elemental calcium Chew 1 tablet by mouth 2 (two) times daily as needed for indigestion or heartburn.   cyanocobalamin 2000 MCG tablet Take 2,000 mcg by mouth daily.   Fish Oil 1000 MG Caps Take 1,000 mg by mouth 2 (two) times daily.   gabapentin 100 MG capsule Commonly known as: NEURONTIN Take 100-200 mg by mouth at bedtime.   GREEN TEA EXTRACT PO Take by mouth. Uses this in his tea bags periodically   ipratropium 0.06 % nasal spray Commonly known as: ATROVENT SMARTSIG:2 Spray(s) Both Nares Twice Daily PRN   L-THEANINE PO Take 200 mg by mouth daily as needed (stress).    lisinopril 5 MG tablet Commonly known as: ZESTRIL Take by mouth. What changed: Another medication with the same name was removed. Continue taking this medication, and follow the directions you see here. Changed by: Abbie Sons, MD   Magnesium 250 MG Tabs Take 250 mg by mouth at bedtime.   multivitamin with minerals Tabs tablet Take 1 tablet by mouth daily.   naproxen 250 MG tablet Commonly known as: NAPROSYN Take 250 mg by mouth as needed.   rosuvastatin 10 MG tablet Commonly known as: CRESTOR Take 10 mg by mouth daily.   tadalafil 20 MG tablet Commonly known as: CIALIS Take 20 mg by mouth daily as needed.   Turmeric 500 MG Caps Take 500 mg by mouth daily.   Ubrelvy 50 MG Tabs Generic drug: Ubrogepant Take 1 tablet by mouth today for migraine - may repeat in 3-4 hours if needed.   Vitamin D3 125 MCG (5000 UT) Caps Take 5,000 Units by mouth daily.        Allergies:  Allergies  Allergen Reactions   Atorvastatin Other (See Comments)    Joint pain Joint pain    Prednisone Other (See Comments)    After 3 days agitation and sleeplessness After 3 days agitation and sleeplessness    Verapamil Nausea Only    Other reaction(s): Headache insomnia insomnia    Cholestatin     Other reaction(s): Other (See Comments) Sinus issues   Metoprolol Palpitations    Per patient, can't tolerate. Per patient, can't tolerate.    Seasonal Ic [Cholestatin] Other (See Comments)    Sinus issues    Family History: Family History  Problem Relation Age of Onset   Hypertension Mother    Prostatitis Father    Hypertension Father     Social History:  reports that he has never smoked. He has never used smokeless tobacco. He reports that he does not drink alcohol and does not use drugs.   Physical Exam: BP (!) 147/88   Pulse 93   Ht 6' (1.829 m)   Wt 188 lb (85.3 kg)   BMI 25.50 kg/m   Constitutional:  Alert and oriented, No acute distress. HEENT: [place] AT, moist  mucus membranes.  Trachea midline, no masses. Cardiovascular: No clubbing, cyanosis, or edema. Respiratory: Normal respiratory effort, no increased work of breathing. GI: Abdomen is soft, nontender, nondistended, no abdominal masses GU: Moderate left hydrocele.  Right testis palpably normal.  Left testis not palpable secondary to hydrocele.  No evidence of hernia Skin: No rashes, bruises or suspicious lesions. Neurologic: Grossly intact, no focal deficits, moving all 4 extremities. Psychiatric: Normal mood and affect.   Pertinent Imaging: Scrotal ultrasound images were personally reviewed and interpreted  Assessment & Plan:    1.  Left hydrocele Bothersome symptoms Management  options were discussed including observation, aspiration and hydrocelectomy.  The high recurrence rate of aspiration was discussed He would like to proceed with hydrocelectomy.  The procedure was discussed in detail including a postoperative drain for 2-3 days.  Potential complications were reviewed including bleeding/hematoma, infection/abscess either which could potentially require a additional procedure for treatment.  The recurrence rate is low.  All questions were answered and he desires to schedule    Abbie Sons, MD  [facility] [address], [place], [place] [address] [phone]

## 2021-09-20 ENCOUNTER — Other Ambulatory Visit: Payer: Self-pay | Admitting: Urology

## 2021-09-20 DIAGNOSIS — N433 Hydrocele, unspecified: Secondary | ICD-10-CM

## 2021-09-20 NOTE — Progress Notes (Signed)
Surgical Physician Order Form [facility] [place]  * Scheduling expectation : Next Available  *Length of Case: 60 min  *Clearance needed: no  *Anticoagulation Instructions: N/A  *Aspirin Instructions: N/A  *Post-op visit Date/Instructions:  1-3 day drain removal  *Diagnosis: Hydrocele  *Procedure: Left Hydrocele excision groin/unilateral scrotal approach (30160)   Additional orders: N/A  -Admit type: OUTpatient  -Anesthesia: Choice  -VTE Prophylaxis Standing Order SCD's       Other:   -Standing Lab Orders Per Anesthesia    Lab other: None  -Standing Test orders EKG/Chest x-ray per Anesthesia       Test other:   - Medications:  Ancef 2gm IV  -Other orders:  N/A

## 2021-09-24 ENCOUNTER — Telehealth: Payer: Self-pay

## 2021-09-24 NOTE — Telephone Encounter (Signed)
I spoke with Mr. Rodriquez. We have discussed possible surgery dates and Tuesday June 20th, 2023 was agreed upon by all parties. Patient given information about surgery date, what to expect pre-operatively and post operatively.  We discussed that a Pre-Admission Testing office will be calling to set up the pre-op visit that will take place prior to surgery, and that these appointments are typically done over the phone with a Pre-Admissions RN.  Informed patient that our office will communicate any additional care to be provided after surgery. Patients questions or concerns were discussed during our call. Advised to call our office should there be any additional information, questions or concerns that arise. Patient verbalized understanding.

## 2021-09-24 NOTE — Progress Notes (Signed)
[facility] Posting Form   Surgery Date/Time: Date: 10/06/2021  Surgeon: Dr. John Giovanni, MD  Surgery Location: Day Surgery  Inpt ( No  )   Outpt (Yes)   Obs ( No  )   Diagnosis: Left Hydrocele N43.3  -CPT: 11643  Surgery: Left Hydrocelectomy  Stop Anticoagulations: N/A  Cardiac/Medical/Pulmonary Clearance needed: no  *Orders entered into EPIC  Date: 09/24/21   *Case booked in EPIC  Date: 09/24/21  *Notified pt of Surgery: Date: 09/24/21  PRE-OP UA & CX: no  *Placed into Prior Authorization Work Que Date: 09/24/21   Assistant/laser/rep:No

## 2021-09-24 NOTE — Addendum Note (Signed)
Addended by: Gerald Leitz A on: 09/24/2021 11:20 AM   Modules accepted: Orders

## 2021-09-30 ENCOUNTER — Encounter
Admission: RE | Admit: 2021-09-30 | Discharge: 2021-09-30 | Disposition: A | Discharge status: Home / Self Care | Payer: Commercial Managed Care - HMO | Source: Ambulatory Visit | Attending: Urology | Admitting: Urology

## 2021-09-30 DIAGNOSIS — Z01818 Encounter for other preprocedural examination: Secondary | ICD-10-CM

## 2021-09-30 DIAGNOSIS — I10 Essential (primary) hypertension: Secondary | ICD-10-CM

## 2021-09-30 HISTORY — DX: COVID-19: U07.1

## 2021-09-30 HISTORY — DX: Hyperlipidemia, unspecified: E78.5

## 2021-09-30 HISTORY — DX: Fibromyalgia: M79.7

## 2021-09-30 HISTORY — DX: Gastro-esophageal reflux disease without esophagitis: K21.9

## 2021-09-30 NOTE — Patient Instructions (Signed)
Your procedure is scheduled on:10-06-21 Tuesday Report to the Registration Desk on the 1st floor of the [facility].Then proceed to the 2nd floor Surgery Desk To find out your arrival time, please call [phone] between 1PM - 3PM on:10-05-21 Monday If your arrival time is 6:00 am, do not arrive prior to that time as the [facility] entrance doors do not open until 6:00 am.  REMEMBER: Instructions that are not followed completely may result in serious medical risk, up to and including death; or upon the discretion of your surgeon and anesthesiologist your surgery may need to be rescheduled.  Do not eat food OR drink any liquids after midnight the night before surgery.  No gum chewing, lozengers or hard candies.  TAKE THESE MEDICATIONS THE MORNING OF SURGERY WITH A SIP OF WATER: -rosuvastatin (CRESTOR)   One week prior to surgery: Stop Anti-inflammatories (NSAIDS) such as Advil, Aleve, Ibuprofen, Motrin, Naproxen, Naprosyn and Aspirin based products such as Excedrin, Goodys Powder, BC Powder.You may however, take Tylenol if needed for pain up until the day of surgery. Stop ANY OVER THE COUNTER supplements/vitamins NOW (09-30-21) until after surgery.  No Alcohol for 24 hours before or after surgery.  No Smoking including e-cigarettes for 24 hours prior to surgery.  No chewable tobacco products for at least 6 hours prior to surgery.  No nicotine patches on the day of surgery.  Do not use any "recreational" drugs for at least a week prior to your surgery.  Please be advised that the combination of cocaine and anesthesia may have negative outcomes, up to and including death. If you test positive for cocaine, your surgery will be cancelled.  On the morning of surgery brush your teeth with toothpaste and water, you may rinse your mouth with mouthwash if you wish. Do not swallow any toothpaste or mouthwash.  Do not wear jewelry, make-up, hairpins, clips or nail polish.  Do not wear  lotions, powders, or perfumes.   Do not shave body from the neck down 48 hours prior to surgery just in case you cut yourself which could leave a site for infection.  Also, freshly shaved skin may become irritated if using the CHG soap.  Contact lenses, hearing aids and dentures may not be worn into surgery.  Do not bring valuables to the hospital. [facility] is not responsible for any missing/lost belongings or valuables.   Notify your doctor if there is any change in your medical condition (cold, fever, infection).  Wear comfortable clothing (specific to your surgery type) to the hospital.  After surgery, you can help prevent lung complications by doing breathing exercises.  Take deep breaths and cough every 1-2 hours. Your doctor may order a device called an Incentive Spirometer to help you take deep breaths. When coughing or sneezing, hold a pillow firmly against your incision with both hands. This is called "splinting." Doing this helps protect your incision. It also decreases belly discomfort.  If you are being admitted to the hospital overnight, leave your suitcase in the car. After surgery it may be brought to your room.  If you are being discharged the day of surgery, you will not be allowed to drive home. You will need a responsible adult (18 years or older) to drive you home and stay with you that night.   If you are taking public transportation, you will need to have a responsible adult (18 years or older) with you. Please confirm with your physician that it is acceptable to use  public transportation.   Please call the [facility] Dept. at [phone] if you have any questions about these instructions.  Surgery Visitation Policy:  Patients undergoing a surgery or procedure may have two family members or support persons with them as long as the person is not COVID-19 positive or experiencing its symptoms.

## 2021-10-01 ENCOUNTER — Encounter
Admission: RE | Admit: 2021-10-01 | Discharge: 2021-10-01 | Disposition: A | Discharge status: Home / Self Care | Payer: Commercial Managed Care - HMO | Source: Ambulatory Visit | Attending: Urology | Admitting: Urology

## 2021-10-01 DIAGNOSIS — I10 Essential (primary) hypertension: Secondary | ICD-10-CM | POA: Diagnosis not present

## 2021-10-01 DIAGNOSIS — Z01818 Encounter for other preprocedural examination: Secondary | ICD-10-CM | POA: Insufficient documentation

## 2021-10-01 LAB — BASIC METABOLIC PANEL
Anion gap: 6 (ref 5–15)
BUN: 14 mg/dL (ref 8–23)
CO2: 27 mmol/L (ref 22–32)
Calcium: 9.5 mg/dL (ref 8.9–10.3)
Chloride: 106 mmol/L (ref 98–111)
Creatinine, Ser: 0.81 mg/dL (ref 0.61–1.24)
GFR, Estimated: >60 mL/min (ref >60)
Glucose, Bld: 63 mg/dL — ABNORMAL LOW (ref 70–99)
Potassium: 3.8 mmol/L (ref 3.5–5.1)
Sodium: 139 mmol/L (ref 135–145)

## 2021-10-05 MED ORDER — CHLORHEXIDINE GLUCONATE 0.12 % MT SOLN: 15.0000 mL | Freq: Once | OROMUCOSAL | Status: AC

## 2021-10-05 MED ORDER — CEFAZOLIN SODIUM-DEXTROSE 2-4 GM/100ML-% IV SOLN
2.0000 g | INTRAVENOUS | Status: AC
  Administered 2021-10-06: 2 g via INTRAVENOUS

## 2021-10-05 MED ORDER — CELECOXIB 200 MG PO CAPS: 200.0000 mg | ORAL_CAPSULE | Freq: Once | ORAL | Status: AC

## 2021-10-05 MED ORDER — FAMOTIDINE 20 MG PO TABS: 20.0000 mg | ORAL_TABLET | Freq: Once | ORAL | Status: AC

## 2021-10-05 MED ORDER — LACTATED RINGERS IV SOLN: INTRAVENOUS | Status: DC

## 2021-10-05 MED ORDER — ORAL CARE MOUTH RINSE: 15.0000 mL | Freq: Once | OROMUCOSAL | Status: AC

## 2021-10-05 MED ORDER — GABAPENTIN 600 MG PO TABS
300.0000 mg | ORAL_TABLET | Freq: Once | ORAL | Status: DC
  Filled 2021-10-05: qty 0.5

## 2021-10-05 MED ORDER — ACETAMINOPHEN 500 MG PO TABS: 1000.0000 mg | ORAL_TABLET | Freq: Once | ORAL | Status: AC

## 2021-10-05 NOTE — Anesthesia Preprocedure Evaluation (Addendum)
Anesthesia Evaluation  Patient identified by MRN, date of birth, ID band Patient awake    Reviewed: Allergy & Precautions, H&P , NPO status , reviewed documented beta blocker date and time   Airway Mallampati: III  TM Distance: >3 FB Neck ROM: full    Dental  (+) Chipped   Pulmonary    Pulmonary exam normal        Cardiovascular hypertension, Normal cardiovascular exam     Neuro/Psych  Headaches, Anxiety  Neuromuscular disease    GI/Hepatic neg GERD  ,  Endo/Other    Renal/GU      Musculoskeletal  (+) Arthritis ,   Abdominal   Peds  Hematology   Anesthesia Other Findings Past Medical History: No date: Anxiety 12/2017: Arthritis     Comment:  spinal stenosis 12/2017: BPH (benign prostatic hyperplasia) No date: Headache     Comment:  monthly migraines often d/t sinus and allergy problems.               Naproxen is treatment 12/2017: Hematuria 1977, 1983,2000: History of kidney stones     Comment:  passed on his own. 7 different cycles of stones during               youth. 12/2017: Hypertension     Comment:  not diagnosed but bp elevated recently d/t stress &               pain. No date: Neuromuscular disorder ([place])     Comment:  fibromyalgia. diagnosed in 2000 12/2017: Nocturia associated with benign prostatic hyperplasia  Past Surgical History: 2000: [place]: BACK SURGERY     Comment:  laminectomy x 2.  No date: COLONOSCOPY 01/17/2018: CYSTOSCOPY WITH INSERTION OF UROLIFT; N/A     Comment:  Procedure: CYSTOSCOPY WITH INSERTION OF UROLIFT;                Surgeon: Abbie Sons, MD;  Location: [facility] ORS;                Service: Urology;  Laterality: N/A; 2000: HERNIA REPAIR; Left     Comment:  double inguinal hernia repairs 1978: NASAL SEPTUM SURGERY     Comment:  repair of sinuses also 2005: TESTICULAR TORSION REPAIR 1970: TONSILLECTOMY     Reproductive/Obstetrics                              Anesthesia Physical  Anesthesia Plan  ASA: II  Anesthesia Plan: General   Post-op Pain Management: Celebrex PO (pre-op)*, Tylenol PO (pre-op)* and Gabapentin PO (pre-op)*   Induction: Intravenous  PONV Risk Score and Plan: Ondansetron, Dexamethasone and Midazolam  Airway Management Planned: LMA  Additional Equipment:   Intra-op Plan:   Post-operative Plan:   Informed Consent:     Dental Advisory Given  Plan Discussed with: CRNA  Anesthesia Plan Comments:        Anesthesia Quick Evaluation

## 2021-10-06 ENCOUNTER — Encounter
Admission: RE | Disposition: A | Discharge status: Home / Self Care | Payer: Self-pay | Source: Home / Self Care | Attending: Urology

## 2021-10-06 ENCOUNTER — Ambulatory Visit
Admission: RE | Admit: 2021-10-06 | Discharge: 2021-10-06 | Disposition: A | Discharge status: Home / Self Care | Payer: Commercial Managed Care - HMO | Attending: Urology | Admitting: Urology

## 2021-10-06 ENCOUNTER — Ambulatory Visit: Payer: Commercial Managed Care - HMO | Admitting: Urgent Care

## 2021-10-06 ENCOUNTER — Encounter: Payer: Self-pay | Admitting: Urology

## 2021-10-06 ENCOUNTER — Other Ambulatory Visit: Payer: Self-pay

## 2021-10-06 DIAGNOSIS — I10 Essential (primary) hypertension: Secondary | ICD-10-CM | POA: Insufficient documentation

## 2021-10-06 DIAGNOSIS — N433 Hydrocele, unspecified: Secondary | ICD-10-CM | POA: Insufficient documentation

## 2021-10-06 DIAGNOSIS — G43909 Migraine, unspecified, not intractable, without status migrainosus: Secondary | ICD-10-CM | POA: Insufficient documentation

## 2021-10-06 DIAGNOSIS — R351 Nocturia: Secondary | ICD-10-CM | POA: Diagnosis not present

## 2021-10-06 DIAGNOSIS — M1990 Unspecified osteoarthritis, unspecified site: Secondary | ICD-10-CM | POA: Diagnosis not present

## 2021-10-06 DIAGNOSIS — M797 Fibromyalgia: Secondary | ICD-10-CM | POA: Diagnosis not present

## 2021-10-06 DIAGNOSIS — N401 Enlarged prostate with lower urinary tract symptoms: Secondary | ICD-10-CM | POA: Insufficient documentation

## 2021-10-06 HISTORY — PX: HYDROCELE EXCISION: SHX482

## 2021-10-06 SURGERY — HYDROCELECTOMY
Anesthesia: General | Laterality: Left

## 2021-10-06 MED ORDER — FENTANYL CITRATE (PF) 100 MCG/2ML IJ SOLN
INTRAMUSCULAR | Status: DC | PRN
  Administered 2021-10-06 (×3): 25 ug via INTRAVENOUS

## 2021-10-06 MED ORDER — BACITRACIN 500 UNIT/GM EX OINT
TOPICAL_OINTMENT | CUTANEOUS | Status: DC | PRN
  Administered 2021-10-06: 1 via TOPICAL

## 2021-10-06 MED ORDER — PROPOFOL 10 MG/ML IV BOLUS
INTRAVENOUS | Status: DC | PRN
  Administered 2021-10-06: 150 mg via INTRAVENOUS

## 2021-10-06 MED ORDER — GABAPENTIN 300 MG PO CAPS
ORAL_CAPSULE | ORAL | Status: AC
  Administered 2021-10-06: 300 mg via ORAL
  Filled 2021-10-06: qty 1

## 2021-10-06 MED ORDER — GABAPENTIN 300 MG PO CAPS: 300.0000 mg | ORAL_CAPSULE | Freq: Once | ORAL | Status: AC

## 2021-10-06 MED ORDER — FAMOTIDINE 20 MG PO TABS
ORAL_TABLET | ORAL | Status: AC
  Administered 2021-10-06: 20 mg via ORAL
  Filled 2021-10-06: qty 1

## 2021-10-06 MED ORDER — 0.9 % SODIUM CHLORIDE (POUR BTL) OPTIME
TOPICAL | Status: DC | PRN
  Administered 2021-10-06: 500 mL

## 2021-10-06 MED ORDER — DEXAMETHASONE SODIUM PHOSPHATE 10 MG/ML IJ SOLN
INTRAMUSCULAR | Status: DC | PRN
  Administered 2021-10-06: 10 mg via INTRAVENOUS

## 2021-10-06 MED ORDER — CELECOXIB 200 MG PO CAPS
ORAL_CAPSULE | ORAL | Status: AC
  Administered 2021-10-06: 200 mg via ORAL
  Filled 2021-10-06: qty 1

## 2021-10-06 MED ORDER — FENTANYL CITRATE (PF) 100 MCG/2ML IJ SOLN: 25.0000 ug | INTRAMUSCULAR | Status: DC | PRN

## 2021-10-06 MED ORDER — CHLORHEXIDINE GLUCONATE 0.12 % MT SOLN
OROMUCOSAL | Status: AC
  Administered 2021-10-06: 15 mL via OROMUCOSAL
  Filled 2021-10-06: qty 15

## 2021-10-06 MED ORDER — CEFAZOLIN SODIUM-DEXTROSE 2-4 GM/100ML-% IV SOLN
INTRAVENOUS | Status: AC
  Filled 2021-10-06: qty 100

## 2021-10-06 MED ORDER — LIDOCAINE HCL (CARDIAC) PF 100 MG/5ML IV SOSY
PREFILLED_SYRINGE | INTRAVENOUS | Status: DC | PRN
  Administered 2021-10-06: 100 mg via INTRAVENOUS

## 2021-10-06 MED ORDER — DROPERIDOL 2.5 MG/ML IJ SOLN
0.6250 mg | Freq: Once | INTRAMUSCULAR | Status: DC | PRN
Start: 2021-10-06 — End: 2021-10-06

## 2021-10-06 MED ORDER — HYDROCODONE-ACETAMINOPHEN 5-325 MG PO TABS: 1.0000 | ORAL_TABLET | Freq: Four times a day (QID) | ORAL | 0 refills | Status: DC | PRN

## 2021-10-06 MED ORDER — PROPOFOL 1000 MG/100ML IV EMUL
INTRAVENOUS | Status: AC
  Filled 2021-10-06: qty 100

## 2021-10-06 MED ORDER — ONDANSETRON HCL 4 MG/2ML IJ SOLN
INTRAMUSCULAR | Status: DC | PRN
  Administered 2021-10-06: 4 mg via INTRAVENOUS

## 2021-10-06 MED ORDER — OXYCODONE HCL 5 MG PO TABS
ORAL_TABLET | ORAL | Status: AC
  Filled 2021-10-06: qty 1

## 2021-10-06 MED ORDER — FENTANYL CITRATE (PF) 100 MCG/2ML IJ SOLN
INTRAMUSCULAR | Status: AC
  Filled 2021-10-06: qty 2

## 2021-10-06 MED ORDER — LIDOCAINE HCL (PF) 2 % IJ SOLN
INTRAMUSCULAR | Status: AC
Start: 2021-10-06 — End: ?
  Filled 2021-10-06: qty 5

## 2021-10-06 MED ORDER — MIDAZOLAM HCL 2 MG/2ML IJ SOLN
INTRAMUSCULAR | Status: AC
  Filled 2021-10-06: qty 2

## 2021-10-06 MED ORDER — BUPIVACAINE HCL (PF) 0.5 % IJ SOLN
INTRAMUSCULAR | Status: AC
  Filled 2021-10-06: qty 30

## 2021-10-06 MED ORDER — OXYCODONE HCL 5 MG PO TABS
5.0000 mg | ORAL_TABLET | Freq: Once | ORAL | Status: AC | PRN
  Administered 2021-10-06: 5 mg via ORAL

## 2021-10-06 MED ORDER — SULFAMETHOXAZOLE-TRIMETHOPRIM 800-160 MG PO TABS: 1.0000 | ORAL_TABLET | Freq: Two times a day (BID) | ORAL | 0 refills | Status: AC

## 2021-10-06 MED ORDER — BUPIVACAINE HCL 0.5 % IJ SOLN
INTRAMUSCULAR | Status: DC | PRN
  Administered 2021-10-06: 5 mL

## 2021-10-06 MED ORDER — PROMETHAZINE HCL 25 MG/ML IJ SOLN: 6.2500 mg | INTRAMUSCULAR | Status: DC | PRN

## 2021-10-06 MED ORDER — BACITRACIN ZINC 500 UNIT/GM EX OINT
TOPICAL_OINTMENT | CUTANEOUS | Status: AC
  Filled 2021-10-06: qty 28.35

## 2021-10-06 MED ORDER — ACETAMINOPHEN 500 MG PO TABS
ORAL_TABLET | ORAL | Status: AC
  Administered 2021-10-06: 1000 mg via ORAL
  Filled 2021-10-06: qty 2

## 2021-10-06 MED ORDER — MIDAZOLAM HCL 2 MG/2ML IJ SOLN
INTRAMUSCULAR | Status: DC | PRN
  Administered 2021-10-06: 2 mg via INTRAVENOUS

## 2021-10-06 MED ORDER — OXYCODONE HCL 5 MG/5ML PO SOLN: 5.0000 mg | Freq: Once | ORAL | Status: AC | PRN

## 2021-10-06 MED ORDER — ACETAMINOPHEN 10 MG/ML IV SOLN: 1000.0000 mg | Freq: Once | INTRAVENOUS | Status: DC | PRN

## 2021-10-06 SURGICAL SUPPLY — 36 items
APL PRP STRL LF DISP 70% ISPRP (MISCELLANEOUS) ×1
BLADE CLIPPER SURG (BLADE) ×2 IMPLANT
BLADE SURG 15 STRL LF DISP TIS (BLADE) ×1 IMPLANT
BLADE SURG 15 STRL SS (BLADE) ×2
CHLORAPREP W/TINT 26 (MISCELLANEOUS) ×2 IMPLANT
DRAIN PENROSE 12X.25 LTX STRL (MISCELLANEOUS) IMPLANT
DRAPE LAPAROTOMY 77X122 PED (DRAPES) ×2 IMPLANT
DRSG GAUZE FLUFF 36X18 (GAUZE/BANDAGES/DRESSINGS) ×2 IMPLANT
ELECT CAUTERY BLADE 6.4 (BLADE) IMPLANT
ELECT REM PT RETURN 9FT ADLT (ELECTROSURGICAL) ×2
ELECTRODE REM PT RTRN 9FT ADLT (ELECTROSURGICAL) ×1 IMPLANT
GAUZE 4X4 16PLY ~~LOC~~+RFID DBL (SPONGE) ×1 IMPLANT
GAUZE SPONGE 4X4 12PLY STRL (GAUZE/BANDAGES/DRESSINGS) IMPLANT
GLOVE SURG UNDER POLY LF SZ7.5 (GLOVE) ×2 IMPLANT
GOWN STRL REUS W/ TWL LRG LVL3 (GOWN DISPOSABLE) ×1 IMPLANT
GOWN STRL REUS W/TWL LRG LVL3 (GOWN DISPOSABLE) ×2
GOWN STRL REUS W/TWL XL LVL4 (GOWN DISPOSABLE) ×2 IMPLANT
KIT TURNOVER KIT A (KITS) ×2 IMPLANT
LABEL OR SOLS (LABEL) ×2 IMPLANT
MANIFOLD NEPTUNE II (INSTRUMENTS) ×2 IMPLANT
NDL HYPO 25X1 1.5 SAFETY (NEEDLE) ×1 IMPLANT
NEEDLE HYPO 25X1 1.5 SAFETY (NEEDLE) ×2 IMPLANT
NS IRRIG 500ML POUR BTL (IV SOLUTION) ×2 IMPLANT
PACK BASIN MINOR ARMC (MISCELLANEOUS) ×2 IMPLANT
SUPPORETR ATHLETIC LG (MISCELLANEOUS) ×1 IMPLANT
SUPPORTER ATHLETIC LG (MISCELLANEOUS) ×2
SUT CHROMIC 3 0 PS 2 (SUTURE) IMPLANT
SUT CHROMIC 3 0 SH 27 (SUTURE) ×2 IMPLANT
SUT ETHILON 3-0 FS-10 30 BLK (SUTURE) ×2
SUT ETHILON NAB PS2 4-0 18IN (SUTURE) IMPLANT
SUT MNCRL 3-0 UNDYED SH (SUTURE) ×1 IMPLANT
SUT MONOCRYL 3-0 UNDYED (SUTURE) ×2
SUTURE EHLN 3-0 FS-10 30 BLK (SUTURE) IMPLANT
SYR 10ML LL (SYRINGE) ×2 IMPLANT
SYR BULB IRRIG 60ML STRL (SYRINGE) ×2 IMPLANT
WATER STERILE IRR 500ML POUR (IV SOLUTION) ×1 IMPLANT

## 2021-10-06 NOTE — Op Note (Signed)
Preoperative diagnosis:  Left hydrocele  Postoperative diagnosis:  Left hydrocele  Procedure: Left hydrocelectomy  Surgeon: Abbie Sons, MD  Anesthesia: General  Complications: None  Intraoperative findings: Small right hydrocele.  Testis normal in appearance.  60 cc clear fluid obtained  EBL: Minimal  Specimens: None  Indication: Juan Salas is a 61 y.o. patient with a symptomatic left hydrocele.  After reviewing the management options for treatment, he elected to proceed with the above surgical procedure(s). We have discussed the potential benefits and risks of the procedure, side effects of the proposed treatment, the likelihood of the patient achieving the goals of the procedure, and any potential problems that might occur during the procedure or recuperation. Informed consent has been obtained.  Description of procedure:  The patient was taken to the operating room and general anesthesia was induced.  The patient was placed in the supine position, prepped and draped in the usual sterile fashion, and preoperative antibiotics were administered. A preoperative time-out was performed.   A transverse incision was made in the midportion of the left hemiscrotum.  Dartos fascia was incised with cautery to expose the hydrocele sac.  The hydrocele sac was then bluntly separated from scrotal walls with a combination of blunt dissection and cautery and delivered into the operative field.  The sac was then incised anteriorly and 60 mL of clear fluid was suctioned.  The hydrocele sac was then opened anteriorly with cautery.  The testis and cord structures were normal in appearance.  There was no evidence of a communicating hydrocele or hernia.  The hydrocele sac was excised with cautery leaving a rim of approximately 2 cm around the testis and cord structures.  The hydrocele sac was excised with cautery with a rim that everted and the edges sutured with 3-0 chromic suture.  1/4 inch  Penrose drain was placed through the dependent portion of the right hemiscrotum via a separate stab incision and secured with 3-0 nylon.  The testis was delivered back into the scrotum in its anatomic position.  The incision was anesthetized with 0.25% plain Sensorcaine.  The dartos was closed with a running suture of 3-0 chromic and the skin was closed with a running horizontal mattress suture of 3-0 Monocryl.  A dressing of fluffs and scrotal support was applied.  The patient was transported to the PACU in stable condition after anesthetic reversal.  Plan: Appointment for drain removal scheduled 10/08/2021 Postop follow-up with me and ~ 1 month   Abbie Sons, M.D.

## 2021-10-06 NOTE — Anesthesia Procedure Notes (Signed)
Procedure Name: LMA Insertion Date/Time: 10/06/2021 10:08 AM  Performed by: Esaw Grandchild, CRNAPre-anesthesia Checklist: Patient identified, Emergency Drugs available, Suction available and Patient being monitored Patient Re-evaluated:Patient Re-evaluated prior to induction Oxygen Delivery Method: Circle system utilized Preoxygenation: Pre-oxygenation with 100% oxygen Induction Type: IV induction Ventilation: Mask ventilation without difficulty LMA: LMA flexible inserted LMA Size: 4.5 Tube type: Oral Number of attempts: 1 Placement Confirmation: positive ETCO2 and breath sounds checked- equal and bilateral Tube secured with: Tape Dental Injury: Teeth and Oropharynx as per pre-operative assessment

## 2021-10-06 NOTE — Discharge Instructions (Addendum)
AMBULATORY SURGERY  DISCHARGE INSTRUCTIONS   The drugs that you were given will stay in your system until tomorrow so for the next 24 hours you should not:  Drive an automobile Make any legal decisions Drink any alcoholic beverage   You may resume regular meals tomorrow.  Today it is better to start with liquids and gradually work up to solid foods.  You may eat anything you prefer, but it is better to start with liquids, then soup and crackers, and gradually work up to solid foods.   Please notify your doctor immediately if you have any unusual bleeding, trouble breathing, redness and pain at the surgery site, drainage, fever, or pain not relieved by medication.    Additional Instructions:  Discharge instructions following scrotal surgery  Call your doctor for: Fever is greater than 100.5 Severe nausea or vomiting Increasing pain not controlled by pain medication Increasing redness or drainage from incisions  The number for questions or concerns is [phone]  Activity level: No lifting greater than 10 pounds (about equal to gallon of milk) for the next 2 weeks or until cleared to do so at follow-up appointment.  Otherwise activity as tolerated by comfort level.  Diet: May resume your regular diet as tolerated  Driving: No driving while still taking opiate pain medications (weight at least 6-8 hours after last dose).  No driving if you still sore from surgery as it may limit her ability to react quickly if necessary.  Medications: You may resume your previous meds A prescription for hydrocodone was sent to your pharmacy An antibiotic prescription was sent to take while the drain is in place  Shower/bath: May shower 48 hours after surgery.  No tub bath, hot tub or swimming for 10 days.  Wound care: He may cover wounds with sterile gauze as needed to prevent incisions rubbing on clothes.  Where supportive underwear or a jockstrap for at least 2 weeks.  You should  apply cold compresses (ice or sac of frozen peas/corn) to your scrotum for at least 48 hours to reduce the swelling.  You should expect that his scrotum will swell up initially and then get smaller over the next 2-4 weeks.  Sutures will dissolve and do not need to be removed  Follow-up appointments: Follow-up appointment scheduled 10/08/2021 for drain removal A follow-up appointment will be scheduled in approximately 1 month   Please contact your physician with any problems or Same Day Surgery at [phone], Monday through Friday 6 am to 4 pm, or [place] at [facility] number at [phone].

## 2021-10-06 NOTE — Interval H&P Note (Signed)
History and Physical Interval Note: CV:RRR Lungs:clear  10/06/2021 9:53 AM  Juan Salas  has presented today for surgery, with the diagnosis of Left Hydrocele.  The various methods of treatment have been discussed with the patient and family. After consideration of risks, benefits and other options for treatment, the patient has consented to  Procedure(s): HYDROCELECTOMY ADULT GROIN/UNILATERAL SCROTAL APPROACH (Left) as a surgical intervention.  The patient's history has been reviewed, patient examined, no change in status, stable for surgery.  I have reviewed the patient's chart and labs.  Questions were answered to the patient's satisfaction.     [place]

## 2021-10-06 NOTE — Transfer of Care (Signed)
Immediate Anesthesia Transfer of Care Note  Patient: Juan Salas  Procedure(s) Performed: HYDROCELECTOMY ADULT GROIN/UNILATERAL SCROTAL APPROACH (Left)  Patient Location: PACU  Anesthesia Type:General  Level of Consciousness: drowsy  Airway & Oxygen Therapy: Patient Spontanous Breathing and Patient connected to face mask oxygen  Post-op Assessment: Report given to RN and Post -op Vital signs reviewed and stable  Post vital signs: Reviewed and stable  Last Vitals:  Vitals Value Taken Time  BP 107/62 10/06/21 1104  Temp 36.6 C 10/06/21 1104  Pulse 49 10/06/21 1110  Resp 12 10/06/21 1110  SpO2 99 % 10/06/21 1110  Vitals shown include unvalidated device data.  Last Pain:  Vitals:   10/06/21 0849  TempSrc: Temporal  PainSc: 7          Complications: No notable events documented.

## 2021-10-07 ENCOUNTER — Encounter: Payer: Self-pay | Admitting: Urology

## 2021-10-07 NOTE — Anesthesia Postprocedure Evaluation (Signed)
Anesthesia Post Note  Patient: Juan Salas  Procedure(s) Performed: HYDROCELECTOMY ADULT GROIN/UNILATERAL SCROTAL APPROACH (Left)  Patient location during evaluation: PACU Anesthesia Type: General Level of consciousness: awake and alert Pain management: pain level controlled Vital Signs Assessment: post-procedure vital signs reviewed and stable Respiratory status: spontaneous breathing, nonlabored ventilation and respiratory function stable Cardiovascular status: blood pressure returned to baseline and stable Postop Assessment: no apparent nausea or vomiting Anesthetic complications: no   No notable events documented.   Last Vitals:  Vitals:   10/06/21 1146 10/06/21 1224  BP: (!) 162/86 (!) 148/98  Pulse: 76 72  Resp: 16 16  Temp: (!) 36.3 C   SpO2: 100% 100%    Last Pain:  Vitals:   10/06/21 1224  TempSrc:   PainSc: 2                  [place] Ouch

## 2021-10-08 ENCOUNTER — Ambulatory Visit: Payer: Commercial Managed Care - HMO | Admitting: Urology

## 2021-10-08 VITALS — BP 151/90 | HR 80 | Ht 73.0 in | Wt 188.0 lb

## 2021-10-08 DIAGNOSIS — N433 Hydrocele, unspecified: Secondary | ICD-10-CM

## 2021-11-05 ENCOUNTER — Ambulatory Visit (INDEPENDENT_AMBULATORY_CARE_PROVIDER_SITE_OTHER): Payer: Commercial Managed Care - HMO | Admitting: Urology

## 2021-11-05 ENCOUNTER — Encounter: Payer: Self-pay | Admitting: Urology

## 2021-11-05 VITALS — BP 168/98 | HR 102 | Ht 73.0 in | Wt 185.0 lb

## 2021-11-05 DIAGNOSIS — Z09 Encounter for follow-up examination after completed treatment for conditions other than malignant neoplasm: Secondary | ICD-10-CM

## 2021-11-05 NOTE — Progress Notes (Signed)
11/05/2021 10:55 AM   Juan Salas 1960/12/17 [id number]  Referring provider: Rusty Aus, MD [facility] [place],  [place] [address]  Chief Complaint  Patient presents with   Routine Post Op    HPI: 61 y.o. male presents for postop follow-up.  Left hydrocelectomy 10/06/2021; drain removed 2 days postop No postop problems.  As noted a small portion of a suture that has not dissolved   PMH: Past Medical History:  Diagnosis Date   Anxiety    Arthritis 12/2017   spinal stenosis   BPH (benign prostatic hyperplasia) 12/2017   COVID-19    Fibromyalgia    GERD (gastroesophageal reflux disease)    Headache    monthly migraines often d/t sinus and allergy problems. Naproxen is treatment   Hematuria 12/2017   History of kidney stones 1975/07/28, 07-27-1981   passed on his own. 7 different cycles of stones during youth.   Hyperlipidemia    Hypertension 12/2017   not diagnosed but bp elevated recently d/t stress & pain.   Neuromuscular disorder (HCC)    fibromyalgia. diagnosed in 28-Jul-1998   Nocturia associated with benign prostatic hyperplasia 12/2017    Surgical History: Past Surgical History:  Procedure Laterality Date   APPENDECTOMY  2000   [place], 1994   laminectomy x 2.    COLONOSCOPY     COLONOSCOPY WITH PROPOFOL N/A 09/06/2019   Procedure: COLONOSCOPY WITH PROPOFOL;  Surgeon: Jonathon Bellows, MD;  Location: [facility] ENDOSCOPY;  Service: Gastroenterology;  Laterality: N/A;   CYSTOSCOPY WITH INSERTION OF UROLIFT N/A 01/17/2018   Procedure: CYSTOSCOPY WITH INSERTION OF UROLIFT;  Surgeon: Abbie Sons, MD;  Location: [facility] ORS;  Service: Urology;  Laterality: N/A;   CYSTOSCOPY WITH INSERTION OF UROLIFT  01/2018   HERNIA REPAIR Left 07-28-1998   double inguinal hernia repairs   HYDROCELE EXCISION Left 10/06/2021   Procedure: HYDROCELECTOMY ADULT GROIN/UNILATERAL SCROTAL APPROACH;  Surgeon: Abbie Sons, MD;  Location: [facility]  ORS;  Service: Urology;  Laterality: Left;   NASAL SEPTUM SURGERY  1978   repair of sinuses also   TESTICULAR TORSION REPAIR  July 28, 2003   TONSILLECTOMY  1970    Home Medications:  Allergies as of 11/05/2021       Reactions   Atorvastatin Other (See Comments)   Joint pain Joint pain   Prednisone Other (See Comments)   After 3 days agitation and sleeplessness After 3 days agitation and sleeplessness   Verapamil Nausea Only   Other reaction(s): Headache insomnia insomnia   Metoprolol Palpitations   Per patient, can't tolerate. Per patient, can't tolerate.   Seasonal Ic [cholestatin] Other (See Comments)   Sinus issues   Sulfa Antibiotics Rash        Medication List        Accurate as of November 05, 2021 10:55 AM. If you have any questions, ask your nurse or doctor.          acetaminophen 500 MG tablet Commonly known as: TYLENOL Take 500 mg by mouth every 4 (four) hours as needed for moderate pain or headache.   calcium carbonate 500 MG chewable tablet Commonly known as: TUMS - dosed in mg elemental calcium Chew 1 tablet by mouth 2 (two) times daily as needed for indigestion or heartburn.   cyanocobalamin 2000 MCG tablet Take 2,000 mcg by mouth daily.   Fish Oil 1000 MG Caps Take 1,000 mg by mouth daily.   GREEN TEA EXTRACT PO  Take by mouth. Uses this in his tea bags periodically   HYDROcodone-acetaminophen 5-325 MG tablet Commonly known as: NORCO/VICODIN Take 1 tablet by mouth every 6 (six) hours as needed for moderate pain.   ipratropium 0.06 % nasal spray Commonly known as: ATROVENT SMARTSIG:2 Spray(s) Both Nares Twice Daily PRN   L-THEANINE PO Take 200 mg by mouth daily as needed (stress).   lisinopril 5 MG tablet Commonly known as: ZESTRIL Take 5 mg by mouth at bedtime.   Magnesium 250 MG Tabs Take 250 mg by mouth at bedtime.   multivitamin with minerals Tabs tablet Take 1 tablet by mouth daily.   naproxen 250 MG tablet Commonly known as:  NAPROSYN Take 250 mg by mouth as needed.   rosuvastatin 10 MG tablet Commonly known as: CRESTOR Take 10 mg by mouth every morning.   tadalafil 20 MG tablet Commonly known as: CIALIS Take 20 mg by mouth daily as needed.   Turmeric 500 MG Caps Take 500 mg by mouth daily.   Vitamin D3 50 MCG (2000 UT) Tabs Take 1 tablet by mouth daily at 6 (six) AM.        Allergies:  Allergies  Allergen Reactions   Atorvastatin Other (See Comments)    Joint pain Joint pain    Prednisone Other (See Comments)    After 3 days agitation and sleeplessness After 3 days agitation and sleeplessness    Verapamil Nausea Only    Other reaction(s): Headache insomnia insomnia    Metoprolol Palpitations    Per patient, can't tolerate. Per patient, can't tolerate.    Seasonal Ic [Cholestatin] Other (See Comments)    Sinus issues   Sulfa Antibiotics Rash    Family History: Family History  Problem Relation Age of Onset   Hypertension Mother    Prostatitis Father    Hypertension Father     Social History:  reports that he has never smoked. He has never used smokeless tobacco. He reports that he does not drink alcohol and does not use drugs.   Physical Exam: BP (!) 168/98   Pulse (!) 102   Ht '6\' 1"'$  (1.854 m)   Wt 185 lb (83.9 kg)   BMI 24.41 kg/m   Constitutional:  Alert and oriented, No acute distress. HEENT: [place] AT, moist mucus membranes.  Trachea midline, no masses. Cardiovascular: No clubbing, cyanosis, or edema. Respiratory: Normal respiratory effort, no increased work of breathing. GI: Abdomen is soft, nontender, nondistended, no abdominal masses GU: No scrotal erythema, or tenderness or edema.  Small protruding suture end was cut flush to skin   Assessment & Plan:   Doing well status post left hydrocelectomy Follow-up as needed   Abbie Sons, MD  [place] [address], [place], [place] [address] [phone]

## 2022-12-31 ENCOUNTER — Other Ambulatory Visit (HOSPITAL_COMMUNITY): Payer: Self-pay | Admitting: Family Medicine

## 2022-12-31 DIAGNOSIS — I6529 Occlusion and stenosis of unspecified carotid artery: Secondary | ICD-10-CM

## 2022-12-31 DIAGNOSIS — I6522 Occlusion and stenosis of left carotid artery: Secondary | ICD-10-CM

## 2023-01-05 ENCOUNTER — Ambulatory Visit (HOSPITAL_COMMUNITY): Payer: No Typology Code available for payment source

## 2023-01-05 ENCOUNTER — Encounter (HOSPITAL_COMMUNITY): Payer: Self-pay

## 2023-05-22 ENCOUNTER — Encounter: Payer: Self-pay | Admitting: Family Medicine

## 2023-05-22 DIAGNOSIS — F411 Generalized anxiety disorder: Secondary | ICD-10-CM | POA: Insufficient documentation

## 2023-05-22 DIAGNOSIS — E785 Hyperlipidemia, unspecified: Secondary | ICD-10-CM | POA: Insufficient documentation

## 2023-05-22 DIAGNOSIS — M4800 Spinal stenosis, site unspecified: Secondary | ICD-10-CM | POA: Insufficient documentation

## 2023-05-22 DIAGNOSIS — G43909 Migraine, unspecified, not intractable, without status migrainosus: Secondary | ICD-10-CM | POA: Insufficient documentation

## 2023-05-25 ENCOUNTER — Ambulatory Visit: Payer: No Typology Code available for payment source | Admitting: Family Medicine

## 2023-05-25 ENCOUNTER — Encounter: Payer: Self-pay | Admitting: Family Medicine

## 2023-05-25 NOTE — Progress Notes (Signed)
 Juan Salas T. Terea Neubauer, MD, CAQ Sports Medicine [facility] [address] [place], [address]  Phone: [phone]  FAX: [phone]  Juan Salas - 63 y.o. male  MRN [id number]  Date of Birth: 1961-02-07  Date: 05/26/2023  PCP: Watt Mirza, MD  Referral: No ref. provider found  Chief Complaint  Patient presents with  . Establish Care   Subjective:   Juan Salas is a 63 y.o. very pleasant male patient with Body mass index is 26.52 kg/m. who presents with the following:  Patient presents as a new patient evaluation.  He has been seeing Dr. Oneil Pinal with [facility] for his primary care.  He does have a left side known carotid stenosis visualized on ultrasound.  2022 ultrasound showed a 50% left partial carotid stenosis.  -December 14, 2022, carotid Doppler ultrasound showed possible high-grade stenosis of the left carotid artery and right MRI was ordered. - I have the scanned Carotid US  report from 12/09/2022 that recommends CT angiogram for follow-up.  She also has a history of hypertension and hyperlipidemia.  HTN: on lisinopril  5 mg  HTN: Tolerating all medications without side effects Stable and at goal No CP, no sob. No HA.  BP Readings from Last 3 Encounters:  05/26/23 124/72  11/05/21 (!) 168/98  10/08/21 (!) 151/90    Basic Metabolic Panel:    Component Value Date/Time   NA 139 10/01/2021 0847   NA 141 07/01/2020 0923   K 3.8 10/01/2021 0847   CL 106 10/01/2021 0847   CO2 27 10/01/2021 0847   BUN 14 10/01/2021 0847   BUN 13 07/01/2020 0923   CREATININE 0.81 10/01/2021 0847   GLUCOSE 63 (L) 10/01/2021 0847   CALCIUM  9.5 10/01/2021 0847     Lipids: Doing well, stable. Tolerating meds fine with no SE. Panel reviewed with patient.  Lipids: Lab Results  Component Value Date   CHOL 192 07/01/2020   Lab Results  Component Value Date   HDL 74 07/01/2020   Lab Results  Component Value Date    LDLCALC 105 (H) 07/01/2020   Lab Results  Component Value Date   TRIG 70 07/01/2020   Lab Results  Component Value Date   CHOLHDL 4.1 02/22/2020    Lab Results  Component Value Date   ALT 43 07/01/2020   AST 32 07/01/2020   ALKPHOS 61 07/01/2020   BILITOT 0.3 07/01/2020     Lipids, on Crestor  10 mg  Cialis 20 mg prn  Flu Shingles Prevnar Tdap - he will get all today  Some arthritis and has some   B 3rd digit trigger fingers  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 Vs MR angiogram  Review of Systems is noted in the HPI, as appropriate  Patient Active Problem List   Diagnosis Date Noted  . Hyperlipidemia     Priority: Medium   . Migraine headache     Priority: Medium   . Essential hypertension 10/14/2020    Priority: Medium   . History of diverticulitis 05/26/2023  . GERD without esophagitis 05/26/2023  . Left carotid artery stenosis 05/26/2023  . Spinal stenosis   . GAD (generalized anxiety disorder)   . Nocturia associated with benign prostatic hyperplasia 12/2017    Past Medical History:  Diagnosis Date  . Fibromyalgia   . GAD (generalized anxiety disorder)   . GERD (gastroesophageal reflux disease)   .  GERD without esophagitis 05/26/2023  . History of diverticulitis 05/26/2023  . History of kidney stones 1977, 1983,2000   passed on his own. 7 different cycles of stones during youth.  . Hyperlipidemia   . Left carotid artery stenosis 05/26/2023  . Migraine headache   . Nocturia associated with benign prostatic hyperplasia 12/2017  . Spinal stenosis     Past Surgical History:  Procedure Laterality Date  . APPENDECTOMY  2000  . COLONOSCOPY    . COLONOSCOPY WITH PROPOFOL  N/A 09/06/2019   Procedure: COLONOSCOPY WITH PROPOFOL ;  Surgeon: Therisa Bi, MD;  Location: [facility] ENDOSCOPY;  Service: Gastroenterology;  Laterality: N/A;  . CYSTOSCOPY WITH INSERTION OF  UROLIFT N/A 01/17/2018   Procedure: CYSTOSCOPY WITH INSERTION OF UROLIFT;  Surgeon: Twylla Glendia BROCKS, MD;  Location: [facility] ORS;  Service: Urology;  Laterality: N/A;  . CYSTOSCOPY WITH INSERTION OF UROLIFT  01/2018  . CYSTOSCOPY WITH INSERTION OF UROLIFT Bilateral   . HERNIA REPAIR Left 2000   double inguinal hernia repairs  . HYDROCELE EXCISION Left 10/06/2021   Procedure: HYDROCELECTOMY ADULT GROIN/UNILATERAL SCROTAL APPROACH;  Surgeon: Twylla Glendia BROCKS, MD;  Location: [facility] ORS;  Service: Urology;  Laterality: Left;  . LUMBAR LAMINECTOMY     laminectomy x 2. 1990, 1994  . NASAL SEPTUM SURGERY  1978   repair of sinuses also  . TESTICULAR TORSION REPAIR  2005  . TONSILLECTOMY  1970    Family History  Problem Relation Age of Onset  . Hypertension Mother   . Prostatitis Father   . Hypertension Father     Social History   Social History Narrative   Non-smoker   No ETOH   No drugs   Married [employer])   Exercises at least 3x/week   Theatre Manager (Retired)      [employer]   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     Objective:   BP 124/72 (BP Location: Left Arm, Patient Position: Sitting, Cuff Size: Large)   Pulse 76   Temp 98.2 F (36.8 C) (Temporal)   Ht 5' 10.5 (1.791 m)   Wt 187 lb 8 oz (85 kg)   SpO2 99%   BMI 26.52 kg/m    GEN: WDWN, NAD, Non-toxic HEENT: Atraumatic, Normocephalic. Neck supple. No masses. CV: RRR, No M/G/R. No JVD. No thrill. No extra heart sounds. PULM: CTA B, no wheezes, crackles, rhonchi. No retractions. No resp. distress. No accessory muscle use. EXTR: No c/c/e NEURO Normal gait.  PSYCH: Normally interactive. Conversant.    Laboratory and Imaging Data: Lab Review:     Latest Ref Rng & Units 02/22/2020   10:18 AM 08/22/2019    8:45 AM 01/25/2018   12:00 AM  CBC EXTENDED  WBC 3.4 - 10.8 x10E3/uL 4.5  4.2  4.4   RBC 4.14 - 5.80 x10E6/uL 4.19  4.60  4.54   Hemoglobin 13.0 - 17.7 g/dL 86.5  85.3  85.4    HCT 37.5 - 51.0 % 38.8  42.5  42.1   Platelets 150 - 450 x10E3/uL 256  271  250   NEUT# 1.4 - 7.0 x10E3/uL 2.4  1.8  2.0   Lymph# 0.7 - 3.1 x10E3/uL 1.5  1.9  1.9        Latest Ref Rng & Units 10/01/2021    8:47 AM 07/01/2020    9:23 AM 02/22/2020   10:18 AM  BMP  Glucose 70 - 99 mg/dL 63  898  91   BUN 8 -  23 mg/dL 14  13  15    Creatinine 0.61 - 1.24 mg/dL 9.18  8.86  8.91   BUN/Creat Ratio 10 - 24  12  14    Sodium 135 - 145 mmol/L 139  141  136   Potassium 3.5 - 5.1 mmol/L 3.8  4.9  4.8   Chloride 98 - 111 mmol/L 106  102  98   CO2 22 - 32 mmol/L 27  24  25    Calcium  8.9 - 10.3 mg/dL 9.5  9.4  9.6        Latest Ref Rng & Units 07/01/2020    9:23 AM 02/22/2020   10:18 AM 11/19/2019    2:34 PM  Hepatic Function  Total Protein 6.0 - 8.5 g/dL 7.0  6.9  6.9   Albumin 3.8 - 4.9 g/dL 4.8  4.7  4.8   AST 0 - 40 IU/L 32  29  32   ALT 0 - 44 IU/L 43  32  35   Alk Phosphatase 44 - 121 IU/L 61  58  68   Total Bilirubin 0.0 - 1.2 mg/dL 0.3  0.4  0.3     Lab Results  Component Value Date   CHOL 192 07/01/2020   Lab Results  Component Value Date   HDL 74 07/01/2020   Lab Results  Component Value Date   LDLCALC 105 (H) 07/01/2020   Lab Results  Component Value Date   TRIG 70 07/01/2020   Lab Results  Component Value Date   CHOLHDL 4.1 02/22/2020   No results for input(s): PSA in the last 72 hours. No results found for: HCVAB No results found for: VD25OH   No results found for: HGBA1C Lab Results  Component Value Date   LDLCALC 105 (H) 07/01/2020   CREATININE 0.81 10/01/2021     Assessment and Plan:     ICD-10-CM   1. Left carotid artery stenosis  I65.22     2. Nocturia associated with benign prostatic hyperplasia  N40.1    R35.1     3. Mixed hyperlipidemia  E78.2     4. Spinal stenosis of lumbar region without neurogenic claudication  M48.061     5. GAD (generalized anxiety disorder)  F41.1     6. Essential hypertension  I10     7. Need for  influenza vaccination  Z23 Flu vaccine trivalent PF, 6mos and older(Flulaval,Afluria,Fluarix,Fluzone)    8. Need for shingles vaccine  Z23 Zoster Recombinant (Shingrix  )    9. Need for Tdap vaccination  Z23 Tdap vaccine greater than or equal to 7yo IM    10. History of diverticulitis  Z87.19     11. GERD without esophagitis  K21.9      Extensive chart review, new patient intake forms and discussion with the patient.  Additionally, I called radiology.  Blood pressure is stable, continue lisinopril  5 mg.  Lipids are stable, continue Crestor  10 mg.  Intermittent ED, continue Cialis as needed, which would also likely help with BPH symptoms.  GERD, continue Protonix .  Stable.  Left-sided carotid stenosis that shows a significant stenosis on ultrasound from December 09, 2022.  I have reviewed this myself, and also called radiology with [facility], and they again confirmed that CT angiogram of the neck with contrast would be the most appropriate follow-up study.  We will obtain a CT angiogram of the neck with contrast to evaluate left-sided carotid stenosis in greater detail.  Pending results, consider consultation with vascular surgery if needed.  He is also behind on several vaccinations, we will do those today.  Medication Management during today's office visit: No orders of the defined types were placed in this encounter.  Medications Discontinued During This Encounter  Medication Reason  . Green Tea, Camellia sinensis, (GREEN TEA EXTRACT PO)   . HYDROcodone -acetaminophen  (NORCO/VICODIN) 5-325 MG tablet No longer needed (for PRN medications)  . ipratropium (ATROVENT) 0.06 % nasal spray Completed Course  . Turmeric 500 MG CAPS Completed Course    Orders placed today for conditions managed today: Orders Placed This Encounter  Procedures  . Flu vaccine trivalent PF, 6mos and older(Flulaval,Afluria,Fluarix,Fluzone)  . Tdap vaccine greater than or equal to 7yo IM  . Zoster Recombinant  (Shingrix  )    Disposition: No follow-ups on file.  [employer] One speech-to-text software was used for transcription in this dictation.  Possible transcriptional errors can occur using Animal nutritionist.   Signed,  Juan Salas. Juan Newlun, MD   Outpatient Encounter Medications as of 05/26/2023  Medication Sig  . acetaminophen  (TYLENOL ) 500 MG tablet Take 500 mg by mouth every 4 (four) hours as needed for moderate pain or headache.   . calcium  carbonate (TUMS - DOSED IN MG ELEMENTAL CALCIUM ) 500 MG chewable tablet Chew 1 tablet by mouth 2 (two) times daily as needed for indigestion or heartburn.  . Cholecalciferol  (VITAMIN D3) 50 MCG (2000 UT) TABS Take 1 tablet by mouth daily at 6 (six) AM.  . cyanocobalamin  2000 MCG tablet Take 2,000 mcg by mouth daily.  SABRA L-THEANINE PO Take 200 mg by mouth daily as needed (stress).  . lisinopril  (ZESTRIL ) 5 MG tablet Take 5 mg by mouth at bedtime.  . Magnesium  250 MG TABS Take 250 mg by mouth daily as needed.  . Multiple Vitamin (MULTIVITAMIN WITH MINERALS) TABS tablet Take 1 tablet by mouth daily.  . naproxen (NAPROSYN) 250 MG tablet Take 250 mg by mouth as needed.  . Omega-3 Fatty Acids (FISH OIL) 1000 MG CAPS Take 1,000 mg by mouth daily.  . pantoprazole  (PROTONIX ) 40 MG tablet Take 40 mg by mouth daily.  . rosuvastatin  (CRESTOR ) 10 MG tablet Take 10 mg by mouth every morning.  . tadalafil (CIALIS) 20 MG tablet Take 20 mg by mouth daily as needed.  . [DISCONTINUED] Green Tea, Camellia sinensis, (GREEN TEA EXTRACT PO) Take by mouth. Uses this in his tea bags periodically  . [DISCONTINUED] HYDROcodone -acetaminophen  (NORCO/VICODIN) 5-325 MG tablet Take 1 tablet by mouth every 6 (six) hours as needed for moderate pain.  . [DISCONTINUED] ipratropium (ATROVENT) 0.06 % nasal spray SMARTSIG:2 Spray(s) Both Nares Twice Daily PRN  . [DISCONTINUED] Turmeric 500 MG CAPS Take 500 mg by mouth daily.   No facility-administered encounter medications on file as of  05/26/2023.

## 2023-05-26 ENCOUNTER — Ambulatory Visit: Payer: No Typology Code available for payment source | Admitting: Family Medicine

## 2023-05-26 ENCOUNTER — Encounter: Payer: Self-pay | Admitting: Family Medicine

## 2023-05-26 VITALS — BP 124/72 | HR 76 | Temp 98.2°F | Ht 70.5 in | Wt 187.5 lb

## 2023-05-26 DIAGNOSIS — I6522 Occlusion and stenosis of left carotid artery: Secondary | ICD-10-CM | POA: Insufficient documentation

## 2023-05-26 DIAGNOSIS — Z8719 Personal history of other diseases of the digestive system: Secondary | ICD-10-CM

## 2023-05-26 DIAGNOSIS — K219 Gastro-esophageal reflux disease without esophagitis: Secondary | ICD-10-CM | POA: Insufficient documentation

## 2023-05-26 DIAGNOSIS — M48061 Spinal stenosis, lumbar region without neurogenic claudication: Secondary | ICD-10-CM | POA: Diagnosis not present

## 2023-05-26 DIAGNOSIS — R351 Nocturia: Secondary | ICD-10-CM

## 2023-05-26 DIAGNOSIS — Z23 Encounter for immunization: Secondary | ICD-10-CM

## 2023-05-26 DIAGNOSIS — E782 Mixed hyperlipidemia: Secondary | ICD-10-CM

## 2023-05-26 DIAGNOSIS — I10 Essential (primary) hypertension: Secondary | ICD-10-CM

## 2023-05-26 DIAGNOSIS — F411 Generalized anxiety disorder: Secondary | ICD-10-CM

## 2023-05-26 DIAGNOSIS — N401 Enlarged prostate with lower urinary tract symptoms: Secondary | ICD-10-CM | POA: Diagnosis not present

## 2023-05-26 HISTORY — DX: Occlusion and stenosis of left carotid artery: I65.22

## 2023-05-26 HISTORY — DX: Personal history of other diseases of the digestive system: Z87.19

## 2023-05-26 HISTORY — DX: Gastro-esophageal reflux disease without esophagitis: K21.9

## 2023-05-27 ENCOUNTER — Telehealth: Payer: Self-pay | Admitting: Family Medicine

## 2023-05-27 ENCOUNTER — Encounter: Payer: Self-pay | Admitting: *Deleted

## 2023-05-27 ENCOUNTER — Encounter: Payer: Self-pay | Admitting: Family Medicine

## 2023-05-27 NOTE — Telephone Encounter (Signed)
 I spoke with radiology, and we are going to get a CT angiogram of the neck with contrast.

## 2023-05-27 NOTE — Telephone Encounter (Signed)
 Patient dropped off a report for provider it is box at front dest

## 2023-05-27 NOTE — Progress Notes (Signed)
 Spoke with Juan Salas to let him know that Dr. Morene discussed his case with radiology this morning, and they recommended a CT angiogram of the neck to follow-up on his ultrasound.  The imaging center should call him directly to schedule.  Patient states understanding.

## 2023-05-27 NOTE — Telephone Encounter (Signed)
 Bilateral Carotid Duplex Ultrsound report placed in Dr. Tasia Farr office in box for review.

## 2023-05-31 NOTE — Telephone Encounter (Signed)
Per that patient and his insurance - [facility] is covered.   I will call [facility] tomorrow to follow up

## 2023-06-10 ENCOUNTER — Other Ambulatory Visit: Payer: Self-pay | Admitting: Family Medicine

## 2023-06-10 DIAGNOSIS — I6522 Occlusion and stenosis of left carotid artery: Secondary | ICD-10-CM

## 2023-06-14 ENCOUNTER — Other Ambulatory Visit: Payer: No Typology Code available for payment source

## 2023-06-24 IMAGING — MR MR HEAD WO/W CM
14 series · 48 of 48 positions shown · IV contrast (7.5ml Gadavist)
Comparison: None.

CLINICAL DATA: Chronic daily headaches.

EXAM:
MRI HEAD WITHOUT AND WITH CONTRAST
TECHNIQUE: Multiplanar, multiecho pulse sequences of the brain and surrounding
structures were obtained without and with intravenous contrast.
CONTRAST:  7.5mL GADAVIST GADOBUTROL 1 MMOL/ML IV SOLN

[Series 5: ax dwi_tracew · axial · 3.0mm · 0.65mm/px · z∈[-92,+63]mm · 4 of 48 slices shown]
[im 1/48]
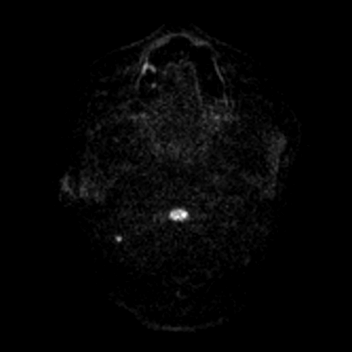
[im 16/48]
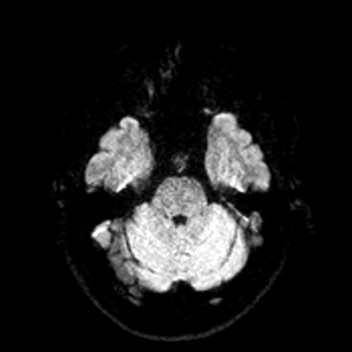
[im 32/48]
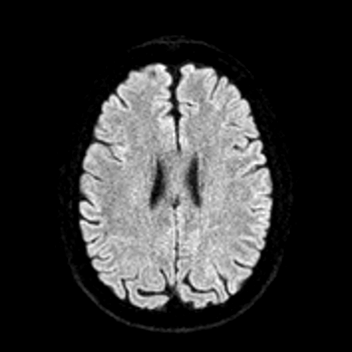
[im 48/48]
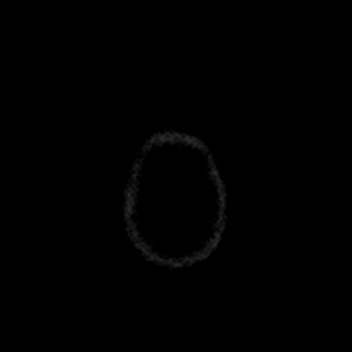

[Series 6: ax dwi_adc · axial · 3.0mm · 0.65mm/px · z∈[-92,+63]mm · 4 of 48 slices shown]
[im 1/48]
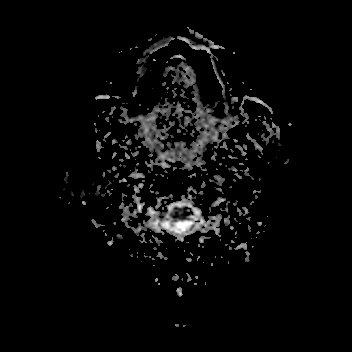
[im 16/48]
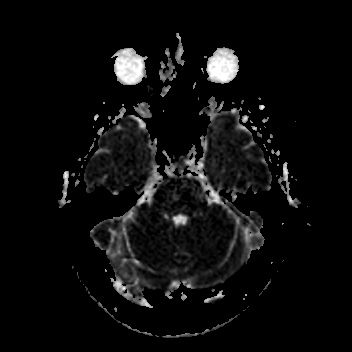
[im 32/48]
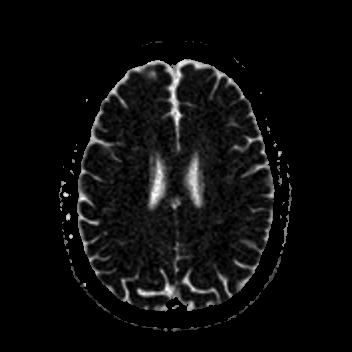
[im 48/48]
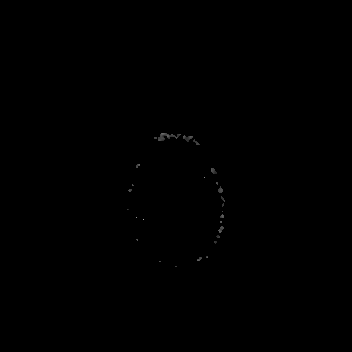

[Series 7: cor dwi_tracew · coronal · 5.0mm · 0.60mm/px · 2 of 38 slices shown]
[im 1/38]
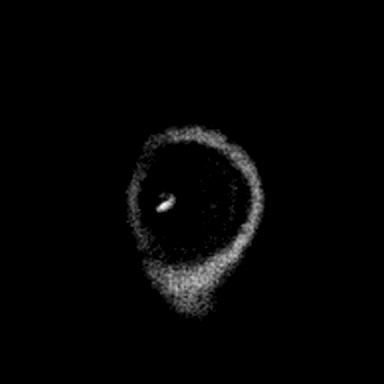
[im 38/38]
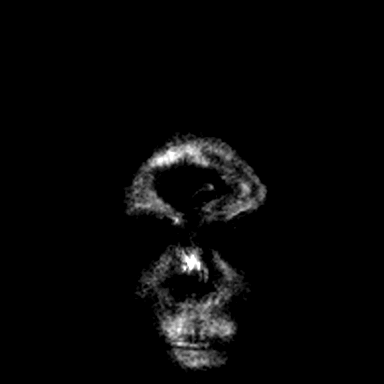

[Series 8: cor dwi_adc · coronal · 5.0mm · 0.60mm/px · 2 of 38 slices shown]
[im 1/38]
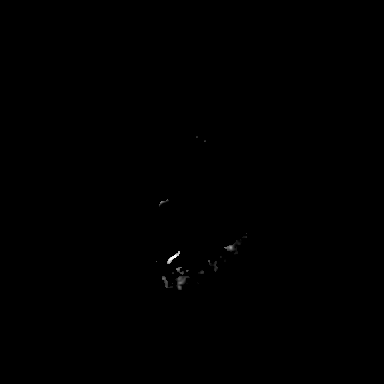
[im 38/38]
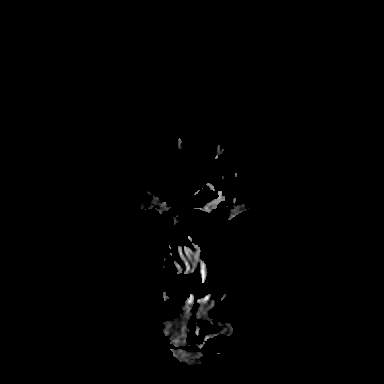

[Series 9: T1 · sagittal · 5.0mm · 0.62mm/px · 1 of 21 slices shown (1 of 2)]
[im 1/21]
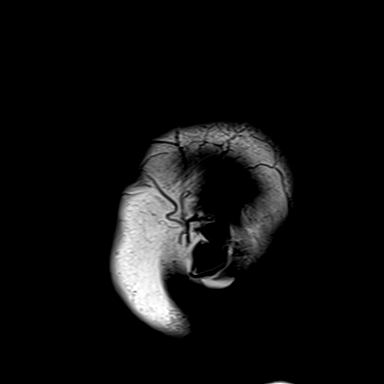

[Series 10: T2 · axial · 5.0mm · 0.53mm/px · 1 of 25 slices shown]
[im 1/25]
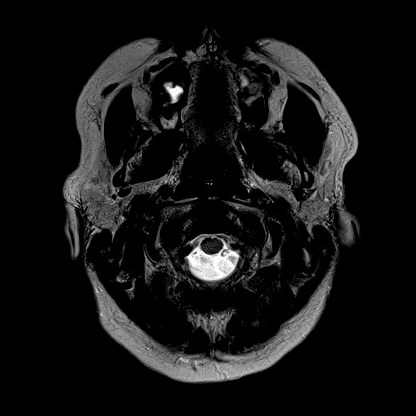

[Series 11: mag_images · axial · 3.0mm · 0.90mm/px · z∈[-100,+77]mm · 3 of 60 slices shown]
[im 1/60]
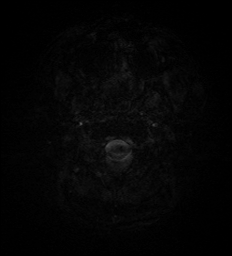
[im 30/60]
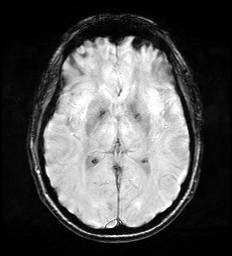
[im 60/60]
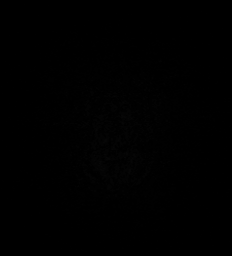

[Series 12: pha_images · axial · 3.0mm · 0.90mm/px · z∈[-100,+77]mm · 3 of 60 slices shown]
[im 1/60]
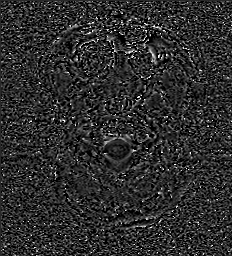
[im 30/60]
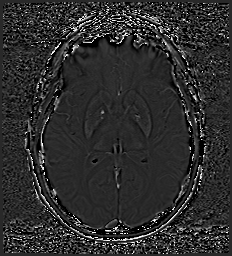
[im 60/60]
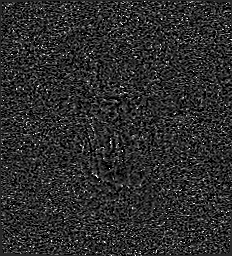

[Series 13: swi_images · axial · 3.0mm · 0.90mm/px · z∈[-100,+77]mm · 3 of 60 slices shown]
[im 1/60]
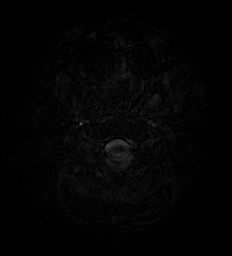
[im 30/60]
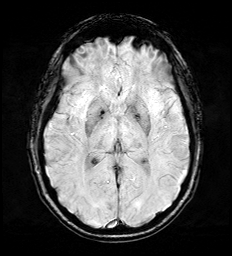
[im 60/60]
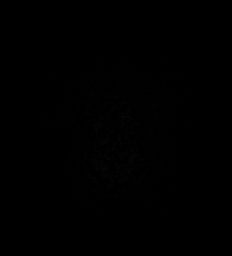

[Series 15: FLAIR · axial · 3.0mm · 0.53mm/px · z∈[-93,+69]mm · 3 of 55 slices shown]
[im 1/55]
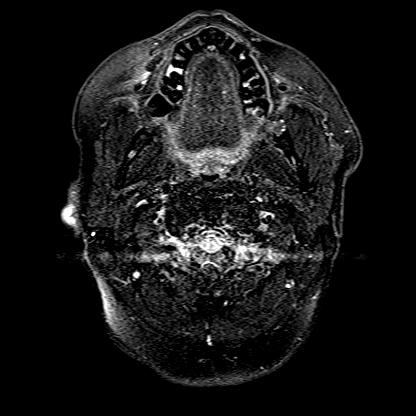
[im 28/55]
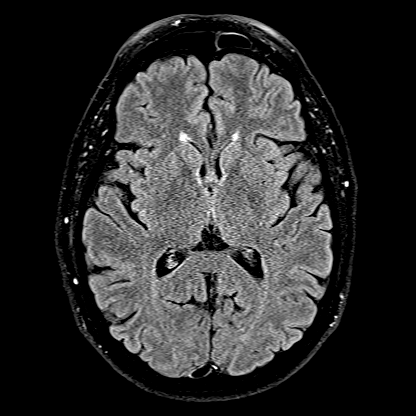
[im 55/55]
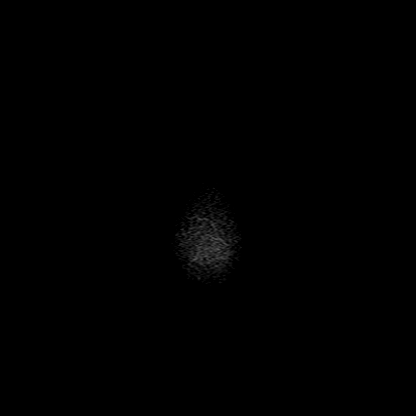

[Series 16: T1 · axial · 1.0mm · 0.98mm/px · z∈[-91,+68]mm · 9 of 160 slices shown (2 of 2)]
[im 1/160]
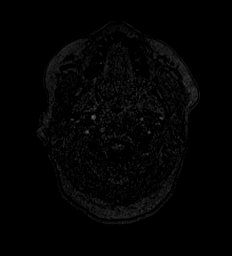
[im 20/160]
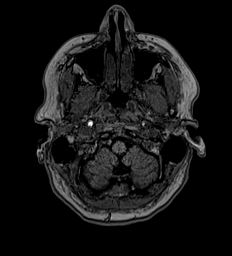
[im 40/160]
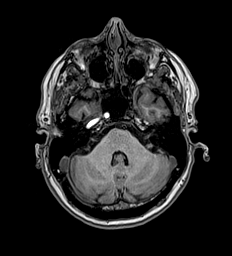
[im 60/160]
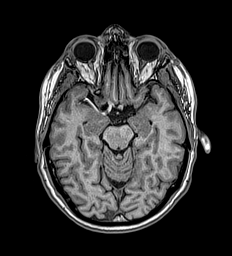
[im 80/160]
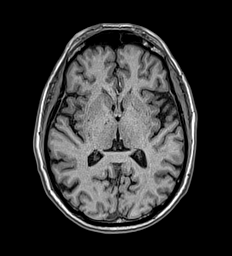
[im 100/160]
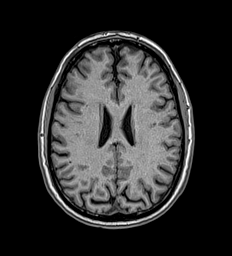
[im 120/160]
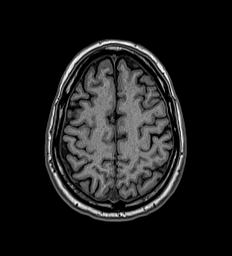
[im 140/160]
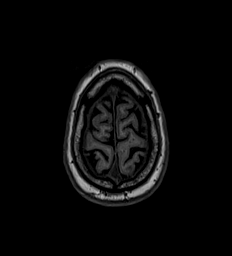
[im 160/160]
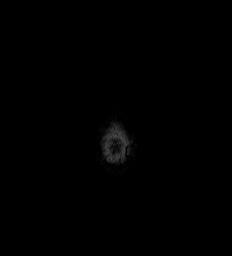

[Series 17: T2 post-contrast · coronal · 5.0mm · 0.57mm/px · 2 of 29 slices shown]
[im 1/29]
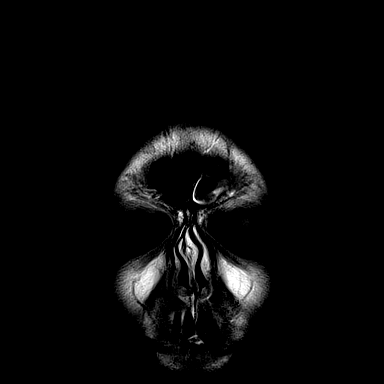
[im 29/29]
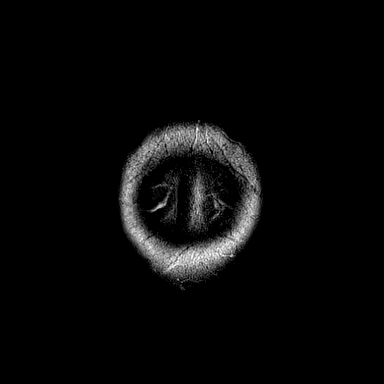

[Series 18: T1 post-contrast · axial · 1.0mm · 0.98mm/px · z∈[-91,+68]mm · 9 of 160 slices shown (1 of 2)]
[im 1/160]
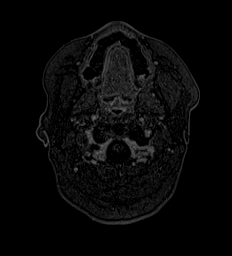
[im 20/160]
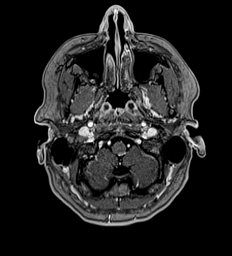
[im 40/160]
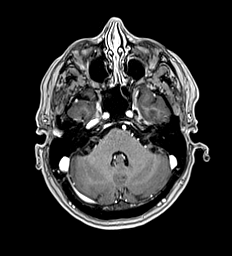
[im 60/160]
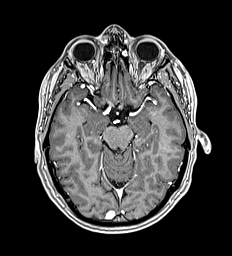
[im 80/160]
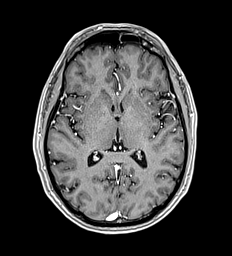
[im 100/160]
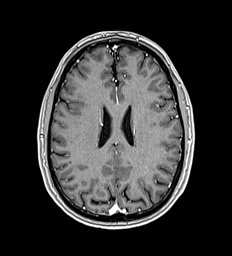
[im 120/160]
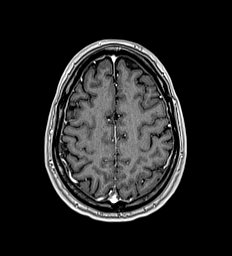
[im 140/160]
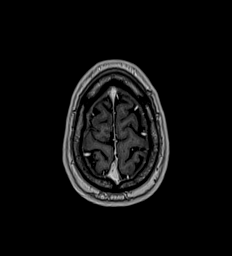
[im 160/160]
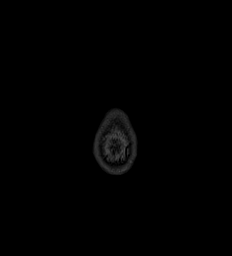

[Series 19: T1 post-contrast · coronal · 5.0mm · 0.57mm/px · 2 of 29 slices shown (2 of 2)]
[im 1/29]
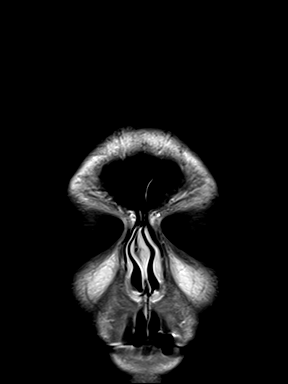
[im 29/29]
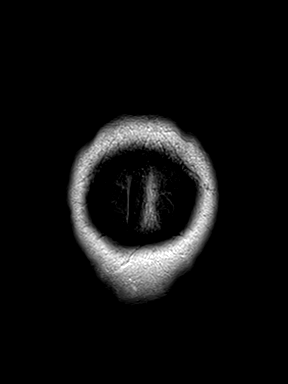

[48 of 48 positions shown; findings below may reference images not displayed]

FINDINGS: Brain: No acute infarction, hemorrhage, hydrocephalus, extra-axial
collection or mass lesion. A few scattered foci of T2 hyperintensity
are seen within the white matter of the cerebral hemispheres,
nonspecific. No focus of abnormal contrast enhancement.

Vascular: Normal flow voids.

Skull and upper cervical spine: Normal marrow signal.

Sinuses/Orbits: Mild mucosal thickening of the left frontal sinus,
bilateral ethmoid cells and maxillary sinuses. The orbits are
maintained.

Other: None.
IMPRESSION: A few nonspecific T2 hyperintense white matter lesions, most likely
related to early chronic microangiopathy.

## 2023-08-10 ENCOUNTER — Encounter: Payer: Self-pay | Admitting: Family Medicine

## 2023-08-10 DIAGNOSIS — I6522 Occlusion and stenosis of left carotid artery: Secondary | ICD-10-CM

## 2023-08-16 NOTE — Progress Notes (Signed)
 Ladona Rosten T. Irwin Toran, MD, CAQ Sports Medicine [facility] at [facility] [address] [place], [address]  Phone: [phone]  FAX: [phone]  Juan Salas - 63 y.o. male  MRN [id number]  Date of Birth: 01-Feb-1961  Date: 08/18/2023  PCP: Scherrie Curt, MD  Referral: Scherrie Curt, MD  Chief Complaint  Patient presents with   Sinus Drainage    Thick mucus in throat especially in the morning    Wheezing   Asthma   Subjective:   Juan Salas is a 63 y.o. very pleasant male patient with Body mass index is 26.81 kg/m. who presents with the following:  Juan Salas is a very nice patient who I met a couple of months ago as a new patient visit.  He has some upper respiratory congestion, and wheezing.  In the past, after COVID he did start to develop some wheezing and required some albuterol .  Prior to COVID, he did not have any history of pulmonary disease.  Post-nasal discharge Not coughing up all that well Nausea He has having some runny nose  Not having a fever Not coughing He does not think he has an acute respiratory illness  Xyzal at night Taking some dayquil Nyquil  Wheezing over the weekend Wheezing started with Covid previously  Flonase daily    Review of Systems is noted in the HPI, as appropriate  Objective:   BP 130/80 (BP Location: Left Arm, Patient Position: Sitting, Cuff Size: Large)   Pulse 65   Temp 98.4 F (36.9 C) (Temporal)   Ht 5' 10.5" (1.791 m)   Wt 189 lb 8 oz (86 kg)   SpO2 99%   BMI 26.81 kg/m    Gen: WDWN, NAD. Globally Non-toxic HEENT: Throat clear, w/o exudate, R TM clear, L TM - good landmarks, No fluid present. rhinnorhea.  MMM Frontal sinuses: NT Max sinuses: NT NECK: Anterior cervical  LAD is absent CV: RRR, No M/G/R, cap refill <2 sec PULM: Breathing comfortably in no respiratory distress. no wheezing, crackles, rhonchi   Laboratory and Imaging Data: Results for orders placed  or performed in visit on 08/18/23  POC COVID-19   Collection Time: 08/18/23  2:56 PM  Result Value Ref Range   SARS Coronavirus 2 Ag Negative Negative     Assessment and Plan:     ICD-10-CM   1. Seasonal allergic rhinitis due to pollen  J30.1     2. Sinus drainage  J34.89 POC COVID-19    3. Wheezing  R06.2      Acute on chronic allergies and allergic rhinitis with wheezing, acute on chronic with exacerbation.  He is still having very significant allergy symptoms despite taking Xyzal and Flonase.  I am going to have him stop Primatene Mist, and have him use some albuterol  as needed.  I am also going to send him in some Singulair .  If he has a brisk response to Singulair , we could use this essentially indefinitely.  Medication Management during today's office visit: Meds ordered this encounter  Medications   montelukast  (SINGULAIR ) 10 MG tablet    Sig: Take 1 tablet (10 mg total) by mouth at bedtime.    Dispense:  30 tablet    Refill:  3   albuterol  (VENTOLIN  HFA) 108 (90 Base) MCG/ACT inhaler    Sig: Inhale 2 puffs into the lungs every 6 (six) hours as needed for wheezing or shortness of breath.    Dispense:  18 g  Refill:  3   There are no discontinued medications.  Orders placed today for conditions managed today: Orders Placed This Encounter  Procedures   POC COVID-19    Disposition: No follow-ups on file.  Dragon [employer] One speech-to-text software was used for transcription in this dictation.  Possible transcriptional errors can occur using Animal nutritionist.   Signed,  Ranny Bye. Sobia Karger, MD   Outpatient Encounter Medications as of 08/18/2023  Medication Sig   acetaminophen  (TYLENOL ) 500 MG tablet Take 500 mg by mouth every 4 (four) hours as needed for moderate pain or headache.    albuterol  (VENTOLIN  HFA) 108 (90 Base) MCG/ACT inhaler Inhale 2 puffs into the lungs every 6 (six) hours as needed for wheezing or shortness of breath.   calcium  carbonate (TUMS -  DOSED IN MG ELEMENTAL CALCIUM ) 500 MG chewable tablet Chew 1 tablet by mouth 2 (two) times daily as needed for indigestion or heartburn.   Cholecalciferol (VITAMIN D3) 50 MCG (2000 UT) TABS Take 1 tablet by mouth daily at 6 (six) AM.   cyanocobalamin 2000 MCG tablet Take 2,000 mcg by mouth daily.   L-THEANINE PO Take 200 mg by mouth daily as needed (stress).   lisinopril  (ZESTRIL ) 5 MG tablet Take 5 mg by mouth at bedtime.   Magnesium 250 MG TABS Take 250 mg by mouth daily as needed.   montelukast  (SINGULAIR ) 10 MG tablet Take 1 tablet (10 mg total) by mouth at bedtime.   Multiple Vitamin (MULTIVITAMIN WITH MINERALS) TABS tablet Take 1 tablet by mouth daily.   naproxen (NAPROSYN) 250 MG tablet Take 250 mg by mouth as needed.   Omega-3 Fatty Acids (FISH OIL) 1000 MG CAPS Take 1,000 mg by mouth daily.   pantoprazole (PROTONIX) 40 MG tablet Take 40 mg by mouth daily.   rosuvastatin (CRESTOR) 10 MG tablet Take 10 mg by mouth every morning.   tadalafil (CIALIS) 20 MG tablet Take 20 mg by mouth daily as needed.   No facility-administered encounter medications on file as of 08/18/2023.

## 2023-08-18 ENCOUNTER — Ambulatory Visit: Admitting: Family Medicine

## 2023-08-18 ENCOUNTER — Encounter: Payer: Self-pay | Admitting: Family Medicine

## 2023-08-18 VITALS — BP 130/80 | HR 65 | Temp 98.4°F | Ht 70.5 in | Wt 189.5 lb

## 2023-08-18 DIAGNOSIS — R062 Wheezing: Secondary | ICD-10-CM

## 2023-08-18 DIAGNOSIS — J3489 Other specified disorders of nose and nasal sinuses: Secondary | ICD-10-CM | POA: Diagnosis not present

## 2023-08-18 DIAGNOSIS — J301 Allergic rhinitis due to pollen: Secondary | ICD-10-CM | POA: Diagnosis not present

## 2023-08-18 LAB — POC COVID19 BINAXNOW: SARS Coronavirus 2 Ag: NEGATIVE

## 2023-08-18 MED ORDER — MONTELUKAST SODIUM 10 MG PO TABS: 10.0000 mg | ORAL_TABLET | Freq: Every day | ORAL | 3 refills | Status: DC

## 2023-08-18 MED ORDER — ALBUTEROL SULFATE HFA 108 (90 BASE) MCG/ACT IN AERS: 2.0000 | INHALATION_SPRAY | Freq: Four times a day (QID) | RESPIRATORY_TRACT | 3 refills | Status: DC | PRN

## 2023-08-25 ENCOUNTER — Encounter: Payer: Self-pay | Admitting: Internal Medicine

## 2023-08-25 ENCOUNTER — Ambulatory Visit: Admitting: Internal Medicine

## 2023-08-25 VITALS — BP 136/84 | HR 86 | Temp 98.6°F | Ht 70.5 in | Wt 189.0 lb

## 2023-08-25 DIAGNOSIS — R0789 Other chest pain: Secondary | ICD-10-CM | POA: Insufficient documentation

## 2023-08-25 NOTE — Progress Notes (Signed)
 Subjective:    Patient ID: Juan Salas, male    DOB: November 16, 1960, 63 y.o.   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      albuterol  (VENTOLIN  HFA) 108 (90 Base) MCG/ACT inhaler Inhale 2 puffs into the lungs every 6 (six) hours as needed for wheezing or shortness of breath. 18 g 3   calcium  carbonate (TUMS - DOSED IN MG ELEMENTAL CALCIUM ) 500 MG chewable tablet Chew 1 tablet by mouth 2 (two) times daily as needed for indigestion or heartburn.     Cholecalciferol (VITAMIN D3) 50 MCG (2000 UT) TABS Take 1 tablet by mouth daily at 6 (six) AM.     cyanocobalamin 2000 MCG tablet Take 2,000 mcg by mouth daily.     L-THEANINE PO Take 200 mg by mouth daily as needed (stress).     lisinopril  (ZESTRIL ) 5 MG tablet Take 5 mg by mouth at bedtime.     Magnesium 250 MG TABS Take 250 mg by mouth daily as needed.     montelukast  (SINGULAIR ) 10 MG tablet Take 1 tablet (10 mg total) by mouth at bedtime. 30 tablet 3   Multiple Vitamin (MULTIVITAMIN WITH MINERALS) TABS tablet Take 1 tablet by mouth daily.     naproxen (NAPROSYN) 250 MG tablet Take 250 mg by mouth as needed.     Omega-3 Fatty Acids (FISH OIL) 1000 MG CAPS Take 1,000  mg by mouth daily.     pantoprazole (PROTONIX) 40 MG tablet Take 40 mg by mouth daily.     rosuvastatin (CRESTOR) 10 MG tablet Take 10 mg by mouth every morning.     tadalafil (CIALIS) 20 MG tablet Take 20 mg by mouth daily as needed.     No current facility-administered medications on file prior to visit.    Allergies  Allergen Reactions   Atorvastatin  Other (See Comments)    Joint pain Joint pain    Prednisone Other (See Comments)    After 3 days agitation and sleeplessness After 3 days agitation and sleeplessness    Verapamil  Nausea Only    Other reaction(s): Headache insomnia insomnia    Metoprolol Palpitations    Per patient, can't tolerate. Per patient, can't tolerate.    Seasonal Ic [Cholestatin] Other (See Comments)    Sinus issues   Sulfa  Antibiotics Rash    Past Medical History:  Diagnosis Date   Fibromyalgia    GAD (generalized anxiety disorder)    GERD without esophagitis 05/26/2023   History of diverticulitis 05/26/2023   History of kidney stones 1977, 1983,2000   passed on his own. 7 different cycles of stones during youth.   Hyperlipidemia    Left carotid artery stenosis 05/26/2023   Migraine headache    Nocturia associated with benign prostatic  hyperplasia 12/2017   Spinal stenosis     Past Surgical History:  Procedure Laterality Date   APPENDECTOMY  2000   COLONOSCOPY     COLONOSCOPY WITH PROPOFOL  N/A 09/06/2019   Procedure: COLONOSCOPY WITH PROPOFOL ;  Surgeon: Luke Salaam, MD;  Location: [facility] ENDOSCOPY;  Service: Gastroenterology;  Laterality: N/A;   CYSTOSCOPY WITH INSERTION OF UROLIFT N/A 01/17/2018   Procedure: CYSTOSCOPY WITH INSERTION OF UROLIFT;  Surgeon: Geraline Knapp, MD;  Location: [facility] ORS;  Service: Urology;  Laterality: N/A;   CYSTOSCOPY WITH INSERTION OF UROLIFT  01/2018   CYSTOSCOPY WITH INSERTION OF UROLIFT Bilateral    HERNIA REPAIR Left 2000   double inguinal hernia repairs   HYDROCELE EXCISION Left 10/06/2021    Procedure: HYDROCELECTOMY ADULT GROIN/UNILATERAL SCROTAL APPROACH;  Surgeon: Geraline Knapp, MD;  Location: [facility] ORS;  Service: Urology;  Laterality: Left;   LUMBAR LAMINECTOMY     laminectomy x 2. 1990, 1994   NASAL SEPTUM SURGERY  1978   repair of sinuses also   TESTICULAR TORSION REPAIR  2005   TONSILLECTOMY  1970    Family History  Problem Relation Age of Onset   Hypertension Mother    Prostatitis Father    Hypertension Father     Social History   Socioeconomic History   Marital status: Married    Spouse name: Debbie   Number of children: 2   Years of education: Not on file   Highest education level: Master's degree (e.g., MA, MS, MEng, MEd, MSW, MBA)  Occupational History   Occupation: Engineer, mining: [employer]  Tobacco Use   Smoking status: Never   Smokeless tobacco: Never  Vaping Use   Vaping status: Never Used  Substance and Sexual Activity   Alcohol use: No   Drug use: No   Sexual activity: Yes  Other Topics Concern   Not on file  Social History Narrative   Non-smoker   No ETOH   No drugs   Married (Debbie)   Exercises at least 3x/week   Theatre manager (Retired)      [employer]   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   Social Drivers of Corporate investment banker Strain: Low Risk  (05/22/2023)   Overall Financial Resource Strain (CARDIA)    Difficulty of Paying Living Expenses: Not hard at all  Food Insecurity: No Food Insecurity (05/22/2023)   Hunger Vital Sign    Worried About Running Out of Food in the Last Year: Never true    Ran Out of Food in the Last Year: Never true  Transportation Needs: No Transportation Needs (05/22/2023)   PRAPARE - Administrator, Civil Service (Medical): No    Lack of Transportation (Non-Medical): No  Physical Activity: Sufficiently Active (05/22/2023)   Exercise Vital Sign    Days of Exercise per Week: 6 days    Minutes of Exercise per Session: 60 min  Stress: No  Stress Concern Present (05/22/2023)   [employer] of Occupational Health - Occupational Stress Questionnaire    Feeling of Stress : Only a little  Social Connections: Moderately Isolated (05/22/2023)   Social Connection and Isolation Panel [NHANES]    Frequency of Communication with Friends and Family: Twice a week    Frequency of Social Gatherings with Friends and Family: Once a week    Attends Religious Services: Never    Database administrator or Organizations: No    Attends  Engineer, structural: Not on file    Marital Status: Married  Catering manager Violence: Not on file   Review of Systems No neurologic symptoms Occasional aura with migraine--but no amaurosis Some dizziness if he gets up quick---and if he picks head up after leaning back (?vestibular) No fever No sig cough---just post nasal drip and congestion     Objective:   Physical Exam Constitutional:      Appearance: Normal appearance.  Cardiovascular:     Rate and Rhythm: Normal rate and regular rhythm.     Pulses: Normal pulses.     Heart sounds: No murmur heard.    No gallop.  Pulmonary:     Effort: Pulmonary effort is normal.     Breath sounds: Normal breath sounds. No wheezing or rales.  Abdominal:     Palpations: Abdomen is soft.     Tenderness: There is no abdominal tenderness.  Musculoskeletal:     Cervical back: Neck supple.     Right lower leg: No edema.     Left lower leg: No edema.  Lymphadenopathy:     Cervical: No cervical adenopathy.  Neurological:     Mental Status: He is alert.            Assessment & Plan:

## 2023-08-25 NOTE — Telephone Encounter (Signed)
 I just spoke to Tim on the telephone.  He is having some chest pressure shortness of breath and fatigue.  I previously saw him he thought he was having some asthma, and he does think that the albuterol  helps a little bit, but his symptoms are very persistent.  He does have a 95% blockage of his internal carotid artery and has vascular surgery follow-up.  After this conversation, I think that he needs to be checked out face-to-face.  Joellen is going to help facilitate this, and I think he at least needs an EKG and some blood work.

## 2023-08-25 NOTE — Assessment & Plan Note (Addendum)
 Exertional symptoms that are very concerning for coronary ischemia--especially with known vascular disease EKG shows sinus at 91--normal axis and intervals. No ischemia or hypertrophy---still normal and no change from 2023  History is still concerning Will set up with cardiology ASAP given the compelling symptoms Resting heart rate is usually in 50's---he notices it being fast over this time Will check troponin---if elevated, will need ER evaluation

## 2023-08-25 NOTE — Addendum Note (Signed)
 Addended by: Bernadene Brewer on: 08/25/2023 03:32 PM   Modules accepted: Orders

## 2023-08-26 ENCOUNTER — Encounter: Payer: Self-pay | Admitting: Internal Medicine

## 2023-08-26 ENCOUNTER — Telehealth: Payer: Self-pay

## 2023-08-26 ENCOUNTER — Emergency Department

## 2023-08-26 ENCOUNTER — Other Ambulatory Visit: Payer: Self-pay

## 2023-08-26 ENCOUNTER — Emergency Department
Admission: EM | Admit: 2023-08-26 | Discharge: 2023-08-26 | Disposition: A | Discharge status: Home / Self Care | Attending: Emergency Medicine | Admitting: Emergency Medicine

## 2023-08-26 DIAGNOSIS — R0789 Other chest pain: Secondary | ICD-10-CM | POA: Insufficient documentation

## 2023-08-26 DIAGNOSIS — R0600 Dyspnea, unspecified: Secondary | ICD-10-CM

## 2023-08-26 DIAGNOSIS — R0602 Shortness of breath: Secondary | ICD-10-CM | POA: Diagnosis present

## 2023-08-26 LAB — CBC
HCT: 42.6 % (ref 39.0–52.0)
HCT: 42.7 % (ref 39.0–52.0)
Hemoglobin: 14.4 g/dL (ref 13.0–17.0)
Hemoglobin: 14.6 g/dL (ref 13.0–17.0)
MCH: 31.5 pg (ref 26.0–34.0)
MCHC: 33.7 g/dL (ref 30.0–36.0)
MCHC: 34.2 g/dL (ref 30.0–36.0)
MCV: 94.4 fl (ref 78.0–100.0)
MCV: 92.2 fL (ref 80.0–100.0)
Platelets: 263 10*3/uL (ref 150.0–400.0)
Platelets: 252 10*3/uL (ref 150–400)
RBC: 4.52 Mil/uL (ref 4.22–5.81)
RBC: 4.63 MIL/uL (ref 4.22–5.81)
RDW: 13.7 % (ref 11.5–15.5)
RDW: 12.9 % (ref 11.5–15.5)
WBC: 4.9 10*3/uL (ref 4.0–10.5)
WBC: 5.4 10*3/uL (ref 4.0–10.5)
nRBC: 0 % (ref 0.0–0.2)

## 2023-08-26 LAB — BASIC METABOLIC PANEL WITH GFR
Anion gap: 11 (ref 5–15)
BUN: 17 mg/dL (ref 8–23)
CO2: 23 mmol/L (ref 22–32)
Calcium: 9.3 mg/dL (ref 8.9–10.3)
Chloride: 97 mmol/L — ABNORMAL LOW (ref 98–111)
Creatinine, Ser: 1.11 mg/dL (ref 0.61–1.24)
GFR, Estimated: >60 mL/min (ref >60)
Glucose, Bld: 111 mg/dL — ABNORMAL HIGH (ref 70–99)
Potassium: 3.9 mmol/L (ref 3.5–5.1)
Sodium: 131 mmol/L — ABNORMAL LOW (ref 135–145)

## 2023-08-26 LAB — COMPREHENSIVE METABOLIC PANEL WITH GFR
ALT: 26 U/L (ref 0–53)
AST: 22 U/L (ref 0–37)
Albumin: 4.8 g/dL (ref 3.5–5.2)
Alkaline Phosphatase: 59 U/L (ref 39–117)
BUN: 17 mg/dL (ref 6–23)
CO2: 28 meq/L (ref 19–32)
Calcium: 9.9 mg/dL (ref 8.4–10.5)
Chloride: 100 meq/L (ref 96–112)
Creatinine, Ser: 1.12 mg/dL (ref 0.40–1.50)
GFR: 69.65 mL/min (ref >60.00)
Glucose, Bld: 97 mg/dL (ref 70–99)
Potassium: 4 meq/L (ref 3.5–5.1)
Sodium: 138 meq/L (ref 135–145)
Total Bilirubin: 0.4 mg/dL (ref 0.2–1.2)
Total Protein: 7 g/dL (ref 6.0–8.3)

## 2023-08-26 LAB — LIPID PANEL
Cholesterol: 242 mg/dL — ABNORMAL HIGH (ref 0–200)
HDL: 56.8 mg/dL (ref >39.00)
LDL Cholesterol: 126 mg/dL — ABNORMAL HIGH (ref 0–99)
NonHDL: 185.02
Total CHOL/HDL Ratio: 4
Triglycerides: 296 mg/dL — ABNORMAL HIGH (ref 0.0–149.0)
VLDL: 59.2 mg/dL — ABNORMAL HIGH (ref 0.0–40.0)

## 2023-08-26 LAB — TROPONIN I (HIGH SENSITIVITY)
High Sens Troponin I: 26 ng/L (ref 2–17)
Troponin I (High Sensitivity): 27 ng/L — ABNORMAL HIGH (ref <18)
Troponin I (High Sensitivity): 27 ng/L — ABNORMAL HIGH (ref <18)

## 2023-08-26 LAB — TSH
TSH: 2.67 u[IU]/mL (ref 0.35–5.50)
TSH: 1.881 u[IU]/mL (ref 0.350–4.500)

## 2023-08-26 MED ORDER — IOHEXOL 350 MG/ML SOLN
75.0000 mL | Freq: Once | INTRAVENOUS | Status: AC | PRN
  Administered 2023-08-26: 75 mL via INTRAVENOUS

## 2023-08-26 NOTE — Telephone Encounter (Signed)
 Called patient will go to [facility] now. His wife will drive him. If any symptoms will stop and call [phone]

## 2023-08-26 NOTE — Discharge Instructions (Signed)
 As discussed please call the number provided for cardiology to arrange a follow-up appointment as soon as possible.  Please inform them that you were seen in the emergency department.  Return to the emergency department for any worsening shortness of breath development of any chest pain or any other symptom personally concerning to yourself.  Otherwise please follow-up with your primary care doctor.

## 2023-08-26 NOTE — Telephone Encounter (Signed)
 CRITICAL VALUE STICKER  CRITICAL VALUE: Troponin 26  RECEIVER (on-site recipient of call):Shatarra Wehling Y Manasvini Whatley, CMA   DATE & TIME NOTIFIED: 10:42 AM 08/26/23   MESSENGER (representative from lab): Hope   MD NOTIFIED: Dr. Herby Lolling  TIME OF NOTIFICATION: 10:45 AM   RESPONSE:  Entered in red book. My chart message sent high priority to provider. CMA made aware will await provider instructions.

## 2023-08-26 NOTE — ED Provider Notes (Signed)
 [facility] Provider Note    Event Date/Time   First MD Initiated Contact with Patient 08/26/23 1335     (approximate)  History   Chief Complaint: Chest Pain and Elevated Troponin  HPI  Juan Salas is a 63 y.o. male with a past medical history of fibromyalgia, anxiety, gastric reflux, hyperlipidemia, presents to the emergency department for shortness of breath and chest tightness.  According to the patient 1.5 weeks ago he was chopping wood when he acutely became dizzy and somewhat nauseated.  Patient states since that time he has not felt well, continues to be "shaky" at times.  He has been experiencing chest tightness and shortness of breath especially with exertion.  Denies any chest "pain."  No leg pain or swelling.  No pleuritic pain.  Patient states at times he feels palpitations in the chest as well.  Physical Exam   Triage Vital Signs: ED Triage Vitals [08/26/23 1152]  Encounter Vitals Group     BP (!) 174/100     Systolic BP Percentile      Diastolic BP Percentile      Pulse Rate 99     Resp 20     Temp 98.5 F (36.9 C)     Temp Source Oral     SpO2 98 %     Weight      Height      Head Circumference      Peak Flow      Pain Score 7     Pain Loc      Pain Education      Exclude from Growth Chart     Most recent vital signs: Vitals:   08/26/23 1152  BP: (!) 174/100  Pulse: 99  Resp: 20  Temp: 98.5 F (36.9 C)  SpO2: 98%    General: Awake, no distress.  CV:  Good peripheral perfusion.  Regular rate and rhythm  Resp:  Normal effort.  Equal breath sounds bilaterally.  Wheeze rales or rhonchi. Abd:  No distention.  Soft, nontender.  No rebound or guarding. Other:  No lower extremity edema or tenderness.   ED Results / Procedures / Treatments   EKG  EKG viewed and interpreted by myself shows what appears to be most consistent with sinus arrhythmia.  91 bpm.  Narrow QRS, left axis deviation, no concerning ST  changes.  RADIOLOGY  Chest x-ray reviewed and interpreted by myself shows no consolidation on my evaluation. Radiology is read the x-ray is negative.   MEDICATIONS ORDERED IN ED: Medications - No data to display   IMPRESSION / MDM / ASSESSMENT AND PLAN / ED COURSE  I reviewed the triage vital signs and the nursing notes.  Patient's presentation is most consistent with acute presentation with potential threat to life or bodily function.  Patient presents to the emergency department with 1 and half weeks of chest tightness/shortness of breath with exertion and feeling "shaky."  Patient states symptoms for started 1.5 weeks ago while chopping wood.  Denies any pain at any point.  Patient had lab work performed yesterday showing a slight troponin elevation and was told to come to the emergency department for evaluation.  Today's lab work is largely unchanged reassuring CBC reassuring chemistry.  Troponin is slightly elevated at 27 (was 26 yesterday).  Given the patient's shortness of breath and chest tightness with slight troponin elevation we will proceed with a CTA of the chest to help rule out pulmonary embolism.  I  have also added on a TSH to evaluate for hyperthyroidism.  Overall patient appears well, labs appear well and vital signs appear well besides hypertension.  Patient is somewhat anxious appearing but states he has not had panic attacks or issues with anxiety for many years now.  CTA of the chest is negative.  Repeat troponin unchanged.  TSH is normal.  I have referred the patient to cardiology will have the patient follow-up with them for further evaluation.  Given the patient's reassuring emergency department workup I believe the patient would be safe for discharge home with outpatient follow-up.  Patient agreeable to plan.  FINAL CLINICAL IMPRESSION(S) / ED DIAGNOSES   Dyspnea   Note:  This document was prepared using Dragon voice recognition software and may include  unintentional dictation errors.   Ruth Cove, MD 08/26/23 1538

## 2023-08-26 NOTE — ED Triage Notes (Addendum)
 Pt to ED via [facility] from home. Pt reports centralized chest tightness and SOB x1.5wks. Pt seen at [facility] clinic at cholesterol was elevated and troponin was 26. Pt has CT scan done recently showing 95% blockage of carotid artery

## 2023-08-29 ENCOUNTER — Ambulatory Visit: Attending: Cardiology | Admitting: Cardiology

## 2023-08-29 VITALS — BP 144/90 | HR 74 | Ht 72.0 in | Wt 191.0 lb

## 2023-08-29 DIAGNOSIS — I6522 Occlusion and stenosis of left carotid artery: Secondary | ICD-10-CM | POA: Diagnosis not present

## 2023-08-29 DIAGNOSIS — I10 Essential (primary) hypertension: Secondary | ICD-10-CM

## 2023-08-29 DIAGNOSIS — R072 Precordial pain: Secondary | ICD-10-CM

## 2023-08-29 DIAGNOSIS — E782 Mixed hyperlipidemia: Secondary | ICD-10-CM | POA: Diagnosis not present

## 2023-08-29 MED ORDER — ROSUVASTATIN CALCIUM 40 MG PO TABS: 40.0000 mg | ORAL_TABLET | ORAL | 3 refills | Status: AC

## 2023-08-29 MED ORDER — METOPROLOL TARTRATE 100 MG PO TABS: ORAL_TABLET | ORAL | 0 refills | Status: DC

## 2023-08-29 NOTE — Patient Instructions (Signed)
 Medication Instructions:  Your physician recommends the following medication changes.  Take one metoprolol 2 hours prior to cardiac CT  INCREASE: Rosuvastatin (crestor) to 40 mg daily   *If you need a refill on your cardiac medications before your next appointment, please call your pharmacy*  Lab Work: Your provider would like for you to have following labs drawn today BMP.    If you have labs (blood work) drawn today and your tests are completely normal, you will receive your results only by: MyChart Message (if you have MyChart) OR A paper copy in the mail If you have any lab test that is abnormal or we need to change your treatment, we will call you to review the results.  Testing/Procedures: Your physician has requested that you have an echocardiogram. Echocardiography is a painless test that uses sound waves to create images of your heart. It provides your doctor with information about the size and shape of your heart and how well your heart's chambers and valves are working.   You may receive an ultrasound enhancing agent through an IV if needed to better visualize your heart during the echo. This procedure takes approximately one hour.  There are no restrictions for this procedure.  This will take place at [address] [facility] [address], [place] [address]  Please note: We ask at that you not bring children with you during ultrasound (echo/ vascular) testing. Due to room size and safety concerns, children are not allowed in the ultrasound rooms during exams. Our front office staff cannot provide observation of children in our lobby area while testing is being conducted. An adult accompanying a patient to their appointment will only be allowed in the ultrasound room at the discretion of the ultrasound technician under special circumstances. We apologize for any inconvenience.      Your cardiac CT will be scheduled at one of the below locations:   [facility] [address] [place], [place] [address] [phone]  OR  [facility] [address] [place], [place] [address] [phone]  OR   [facility] [address] [place], [place] [address] [phone]  OR   [facility] [address] [place], [place] [address] [phone]  OR   [facility] and Vascular Tower [address]  [place], [place] [address] Opening August 15, 2023  If scheduled at [facility], please arrive at the [facility] and Children's Entrance (Entrance C2) of [facility] 30 minutes prior to test start time. You can use the FREE valet parking offered at entrance C (encouraged to control the heart rate for the test)  Proceed to the [facility] Radiology Department (first floor) to check-in and test prep.   All radiology patients and guests should use entrance C2 at [facility], accessed from [facility], even though the hospital's physical address listed is [address]    If scheduled at the Heart and Vascular Tower at [address], please enter the parking lot using the [address] entrance and use the FREE valet service at the patient drop-off area. Enter the buidling and check-in with registration on the main floor.  If scheduled at [facility] or [facility], please arrive 15 mins early for check-in and test prep.  There is spacious parking and easy access to the radiology department from the [facility] Heart and Vascular entrance. Please  enter here and check-in with the desk attendant.   If scheduled at [facility], please arrive 30 minutes early for check-in and test prep.  Please follow these instructions carefully (unless otherwise directed):  An IV will be required for this test and Nitroglycerin will be given.  Hold all erectile  dysfunction medications at least 3 days (72 hrs) prior to test. (Ie viagra, cialis, sildenafil, tadalafil, etc)   On the Night Before the Test: Be sure to Drink plenty of water. Do not consume any caffeinated/decaffeinated beverages or chocolate 12 hours prior to your test. Do not take any antihistamines 12 hours prior to your test.  On the Day of the Test: Drink plenty of water until 1 hour prior to the test. Do not eat any food 1 hour prior to test. You may take your regular medications prior to the test.  Take metoprolol (Lopressor) two hours prior to test. If you take Furosemide/Hydrochlorothiazide/Spironolactone/Chlorthalidone, please HOLD on the morning of the test. Patients who wear a continuous glucose monitor MUST remove the device prior to scanning.      After the Test: Drink plenty of water. After receiving IV contrast, you may experience a mild flushed feeling. This is normal. On occasion, you may experience a mild rash up to 24 hours after the test. This is not dangerous. If this occurs, you can take Benadryl 25 mg, Zyrtec, Claritin, or Allegra and increase your fluid intake. (Patients taking Tikosyn should avoid Benadryl, and may take Zyrtec, Claritin, or Allegra) If you experience trouble breathing, this can be serious. If it is severe call 911 IMMEDIATELY. If it is mild, please call our office.  We will call to schedule your test 2-4 weeks out understanding that some insurance companies will need an authorization prior to the service being performed.   For more information and frequently asked questions, please visit our website : [email or web address]  For non-scheduling related questions, please contact the cardiac imaging nurse navigator should you have any questions/concerns: Cardiac Imaging Nurse Navigators Direct Office Dial: [phone]   For scheduling needs, including cancellations and rescheduling, please call [place], [phone].    Follow-Up: At [facility], you and your health needs are our priority.  As part of our continuing mission to provide you with exceptional heart care, our providers are all part of one team.  This team includes your primary Cardiologist (physician) and Advanced Practice Providers or APPs (Physician Assistants and Nurse Practitioners) who all work together to provide you with the care you need, when you need it.  Your next appointment:   3 month(s)  Provider:   You may see Dr. Junnie Olives or one of the following Advanced Practice Providers on your designated Care Team:   Laneta Pintos, NP Gildardo Labrador, PA-C Varney Gentleman, PA-C Cadence Andover, PA-C Ronald Cockayne, NP Morey Ar, NP    We recommend signing up for the patient portal called "MyChart".  Sign up information is provided on this After Visit Summary.  MyChart is used to connect with patients for Virtual Visits (Telemedicine).  Patients are able to view lab/test results, encounter notes, upcoming appointments, etc.  Non-urgent messages can be sent to your provider as well.   To learn more about what you can do with MyChart, go to [email or web address].

## 2023-08-29 NOTE — Progress Notes (Signed)
 Cardiology Office Note:    Date:  08/29/2023   ID:  Juan Salas, DOB Jun 19, 1960, MRN [id number]  PCP:  Scherrie Curt, MD   [facility] Providers Cardiologist:  None     Referring MD: Scherrie Curt, MD   Chief Complaint  Patient presents with   new patient appt     ED on last Friday. Shob and chest tightness, extreme fatigue.    Juan Salas is a 63 y.o. male who is being seen today for the evaluation of chest pain at the request of Copland, Spencer, MD.   History of Present Illness:    Juan Salas is a 63 y.o. male with a hx of hyperlipidemia, hypertension, left carotid artery stenosis GERD, fibromyalgia who presents due to chest pain.  States having symptoms of chest pain and shortness of breath sometimes with exertion over the past 2 weeks.  He states chopping wood 2 weeks ago and fell a sudden rush of fatigue, dizziness and heart racing.  He has a smart watch, checked his heart rates ranging in the 80s to 90s.  His baseline heart rate is typically in the 60s.  Presented to the ED last week due to persistent symptoms, EKG was unrevealing, troponins were minimally elevated and flat.  He denies personal history of heart disease, and does family history of strokes in mother.  Had a recent neck CT showing severe left carotid artery stenosis, has appointment with vascular surgery in [place] next month.  He denies smoking,  Past Medical History:  Diagnosis Date   Fibromyalgia    GAD (generalized anxiety disorder)    GERD without esophagitis 05/26/2023   History of diverticulitis 05/26/2023   History of kidney stones 1977, 1983,2000   passed on his own. 7 different cycles of stones during youth.   Hyperlipidemia    Left carotid artery stenosis 05/26/2023   Migraine headache    Nocturia associated with benign prostatic hyperplasia 12/2017   Spinal stenosis     Past Surgical History:  Procedure Laterality Date   APPENDECTOMY  2000    COLONOSCOPY     COLONOSCOPY WITH PROPOFOL  N/A 09/06/2019   Procedure: COLONOSCOPY WITH PROPOFOL ;  Surgeon: Luke Salaam, MD;  Location: [facility] ENDOSCOPY;  Service: Gastroenterology;  Laterality: N/A;   CYSTOSCOPY WITH INSERTION OF UROLIFT N/A 01/17/2018   Procedure: CYSTOSCOPY WITH INSERTION OF UROLIFT;  Surgeon: Geraline Knapp, MD;  Location: [facility] ORS;  Service: Urology;  Laterality: N/A;   CYSTOSCOPY WITH INSERTION OF UROLIFT  01/2018   CYSTOSCOPY WITH INSERTION OF UROLIFT Bilateral    HERNIA REPAIR Left 2000   double inguinal hernia repairs   HYDROCELE EXCISION Left 10/06/2021   Procedure: HYDROCELECTOMY ADULT GROIN/UNILATERAL SCROTAL APPROACH;  Surgeon: Geraline Knapp, MD;  Location: [facility] ORS;  Service: Urology;  Laterality: Left;   LUMBAR LAMINECTOMY     laminectomy x 2. 1990, 1994   NASAL SEPTUM SURGERY  1978   repair of sinuses also   TESTICULAR TORSION REPAIR  2005   TONSILLECTOMY  1970    Current Medications: Current Meds  Medication Sig   acetaminophen  (TYLENOL ) 500 MG tablet Take 500 mg by mouth every 4 (four) hours as needed for moderate pain or headache.    albuterol  (VENTOLIN  HFA) 108 (90 Base) MCG/ACT inhaler Inhale 2 puffs into the lungs every 6 (six) hours as needed for wheezing or shortness of breath.   aspirin EC 81 MG tablet Take 81 mg by mouth daily. Swallow whole.  calcium  carbonate (TUMS - DOSED IN MG ELEMENTAL CALCIUM ) 500 MG chewable tablet Chew 1 tablet by mouth 2 (two) times daily as needed for indigestion or heartburn.   Cholecalciferol (VITAMIN D3) 50 MCG (2000 UT) TABS Take 1 tablet by mouth daily at 6 (six) AM.   cyanocobalamin 2000 MCG tablet Take 2,000 mcg by mouth daily.   L-THEANINE PO Take 200 mg by mouth daily as needed (stress).   lisinopril  (ZESTRIL ) 5 MG tablet Take 5 mg by mouth at bedtime.   Magnesium 250 MG TABS Take 250 mg by mouth daily as needed.   metoprolol tartrate (LOPRESSOR) 100 MG tablet TAKE 1 TABLET 2 HR PRIOR TO CARDIAC PROCEDURE    montelukast  (SINGULAIR ) 10 MG tablet Take 1 tablet (10 mg total) by mouth at bedtime.   Multiple Vitamin (MULTIVITAMIN WITH MINERALS) TABS tablet Take 1 tablet by mouth daily.   naproxen (NAPROSYN) 250 MG tablet Take 250 mg by mouth as needed.   Omega-3 Fatty Acids (FISH OIL) 1000 MG CAPS Take 1,000 mg by mouth daily.   pantoprazole (PROTONIX) 40 MG tablet Take 40 mg by mouth daily.   tadalafil (CIALIS) 20 MG tablet Take 20 mg by mouth daily as needed.   [DISCONTINUED] rosuvastatin (CRESTOR) 10 MG tablet Take 10 mg by mouth every morning.     Allergies:   Atorvastatin , Prednisone, Verapamil , Metoprolol, Seasonal ic [cholestatin], and Sulfa  antibiotics   Social History   Socioeconomic History   Marital status: Married    Spouse name: Debbie   Number of children: 2   Years of education: Not on file   Highest education level: Master's degree (e.g., MA, MS, MEng, MEd, MSW, MBA)  Occupational History   Occupation: Engineer, mining: [employer]  Tobacco Use   Smoking status: Never   Smokeless tobacco: Never  Vaping Use   Vaping status: Never Used  Substance and Sexual Activity   Alcohol use: No   Drug use: No   Sexual activity: Yes  Other Topics Concern   Not on file  Social History Narrative   Non-smoker   No ETOH   No drugs   Married (Debbie)   Exercises at least 3x/week   Theatre manager (Retired)      [employer]   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   Social Drivers of Corporate investment banker Strain: Low Risk  (05/22/2023)   Overall Financial Resource Strain (CARDIA)    Difficulty of Paying Living Expenses: Not hard at all  Food Insecurity: No Food Insecurity (05/22/2023)   Hunger Vital Sign    Worried About Running Out of Food in the Last Year: Never true    Ran Out of Food in the Last Year: Never true  Transportation Needs: No Transportation Needs (05/22/2023)   PRAPARE - Administrator, Civil Service (Medical):  No    Lack of Transportation (Non-Medical): No  Physical Activity: Sufficiently Active (05/22/2023)   Exercise Vital Sign    Days of Exercise per Week: 6 days    Minutes of Exercise per Session: 60 min  Stress: No Stress Concern Present (05/22/2023)   [employer] of Occupational Health - Occupational Stress Questionnaire    Feeling of Stress : Only a little  Social Connections: Moderately Isolated (05/22/2023)   Social Connection and Isolation Panel [NHANES]    Frequency of Communication with Friends and Family: Twice a week    Frequency of Social Gatherings with  Friends and Family: Once a week    Attends Religious Services: Never    Database administrator or Organizations: No    Attends Engineer, structural: Not on file    Marital Status: Married     Family History: The patient's family history includes Hypertension in his father and mother; Prostatitis in his father.  ROS:   Please see the history of present illness.     All other systems reviewed and are negative.  EKGs/Labs/Other Studies Reviewed:    The following studies were reviewed today:  EKG Interpretation Date/Time:  Monday Aug 29 2023 09:30:06 EDT Ventricular Rate:  74 PR Interval:  146 QRS Duration:  98 QT Interval:  398 QTC Calculation: 441 R Axis:   -31  Text Interpretation: Normal sinus rhythm Left axis deviation Confirmed by Constancia Delton ([id number]) on 08/29/2023 9:41:07 AM    Recent Labs: 08/25/2023: ALT 26 08/26/2023: BUN 17; Creatinine, Ser 1.11; Hemoglobin 14.6; Platelets 252; Potassium 3.9; Sodium 131; TSH 1.881  Recent Lipid Panel    Component Value Date/Time   CHOL 242 (H) 08/25/2023 1430   CHOL 192 07/01/2020 0923   TRIG 296.0 (H) 08/25/2023 1430   HDL 56.80 08/25/2023 1430   HDL 74 07/01/2020 0923   CHOLHDL 4 08/25/2023 1430   VLDL 59.2 (H) 08/25/2023 1430   LDLCALC 126 (H) 08/25/2023 1430   LDLCALC 105 (H) 07/01/2020 0923     Risk Assessment/Calculations:             Physical Exam:    VS:  BP (!) 144/90 (BP Location: Left Arm, Patient Position: Sitting, Cuff Size: Normal)   Pulse 74   Ht 6' (1.829 m)   Wt 191 lb (86.6 kg)   SpO2 99%   BMI 25.90 kg/m     Wt Readings from Last 3 Encounters:  08/29/23 191 lb (86.6 kg)  08/25/23 189 lb (85.7 kg)  08/18/23 189 lb 8 oz (86 kg)     GEN:  Well nourished, well developed in no acute distress HEENT: Normal NECK: No JVD; No carotid bruits CARDIAC: RRR, no murmurs, rubs, gallops RESPIRATORY:  Clear to auscultation without rales, wheezing or rhonchi  ABDOMEN: Soft, non-tender, non-distended MUSCULOSKELETAL:  No edema; No deformity  SKIN: Warm and dry NEUROLOGIC:  Alert and oriented x 3 PSYCHIATRIC:  Normal affect   ASSESSMENT:    1. Precordial pain   2. Mixed hyperlipidemia   3. Primary hypertension   4. Left carotid artery stenosis    PLAN:    In order of problems listed above:  Chest pain, obtain echo, obtain coronary CTA to evaluate CAD.  Continue aspirin 81 mg daily, increase Crestor to 40 mg daily. Hyperlipidemia, cholesterol not controlled.  Increase Crestor to 40 mg daily. Hypertension, BP slightly elevated, continue lisinopril  5 mg daily.  Titrate if BP elevated at follow-up visit. Left carotid artery stenosis, 95% stenosis on neck CT.  Family concerned weight is too low.  Will refer to vascular specialist for earlier appointment if possible.  Aspirin 81 mg daily, Crestor increased to 40 mg daily.  Follow-up after echo and coronary CT     Medication Adjustments/Labs and Tests Ordered: Current medicines are reviewed at length with the patient today.  Concerns regarding medicines are outlined above.  Orders Placed This Encounter  Procedures   CT CORONARY MORPH W/CTA COR W/SCORE W/CA W/CM &/OR WO/CM   Basic metabolic panel with GFR   Ambulatory referral to Vascular Surgery   EKG 12-Lead  ECHOCARDIOGRAM COMPLETE   Meds ordered this encounter  Medications   metoprolol tartrate  (LOPRESSOR) 100 MG tablet    Sig: TAKE 1 TABLET 2 HR PRIOR TO CARDIAC PROCEDURE    Dispense:  1 tablet    Refill:  0   rosuvastatin (CRESTOR) 40 MG tablet    Sig: Take 1 tablet (40 mg total) by mouth every morning.    Dispense:  90 tablet    Refill:  3    Patient Instructions  Medication Instructions:  Your physician recommends the following medication changes.  Take one metoprolol 2 hours prior to cardiac CT  INCREASE: Rosuvastatin (crestor) to 40 mg daily   *If you need a refill on your cardiac medications before your next appointment, please call your pharmacy*  Lab Work: Your provider would like for you to have following labs drawn today BMP.    If you have labs (blood work) drawn today and your tests are completely normal, you will receive your results only by: MyChart Message (if you have MyChart) OR A paper copy in the mail If you have any lab test that is abnormal or we need to change your treatment, we will call you to review the results.  Testing/Procedures: Your physician has requested that you have an echocardiogram. Echocardiography is a painless test that uses sound waves to create images of your heart. It provides your doctor with information about the size and shape of your heart and how well your heart's chambers and valves are working.   You may receive an ultrasound enhancing agent through an IV if needed to better visualize your heart during the echo. This procedure takes approximately one hour.  There are no restrictions for this procedure.  This will take place at [address] [facility] [address]) [address], [place] [address]  Please note: We ask at that you not bring children with you during ultrasound (echo/ vascular) testing. Due to room size and safety concerns, children are not allowed in the ultrasound rooms during exams. Our front office staff cannot provide observation of children in our lobby area while testing is being conducted. An adult  accompanying a patient to their appointment will only be allowed in the ultrasound room at the discretion of the ultrasound technician under special circumstances. We apologize for any inconvenience.      Your cardiac CT will be scheduled at one of the below locations:   [facility] [address] [place], [place] [address] [phone]  OR  [facility] [address] [place], [place] [address] [phone]  OR   [facility] [address] [place], [place] [address] [phone]  OR   [facility] [address] [place], [place] [address] [phone]  OR   [facility] and Vascular Tower [address]  [place], [place] [address] Opening August 15, 2023  If scheduled at [facility], please arrive at the [facility] and Children's Entrance (Entrance C2) of [facility] 30 minutes prior to test start time. You can use the FREE valet parking offered at entrance C (encouraged to control the heart rate for the test)  Proceed to the [facility] Radiology Department (first floor) to check-in and test prep.   All radiology patients and guests should use entrance C2 at [facility], accessed from [facility], even though the hospital's physical address listed is [address].    If  scheduled at the Heart and Vascular Tower at [address], please enter the parking lot using the [address] entrance and use the FREE valet service at the patient drop-off area. Enter the buidling and check-in with registration on the main floor.  If scheduled at [facility] or [facility], please arrive 15 mins early for check-in and test prep.  There is spacious parking and easy access to the radiology department from the [facility] Heart and Vascular entrance. Please enter here and check-in with the  desk attendant.   If scheduled at [facility], please arrive 30 minutes early for check-in and test prep.  Please follow these instructions carefully (unless otherwise directed):  An IV will be required for this test and Nitroglycerin will be given.  Hold all erectile dysfunction medications at least 3 days (72 hrs) prior to test. (Ie viagra, cialis, sildenafil, tadalafil, etc)   On the Night Before the Test: Be sure to Drink plenty of water. Do not consume any caffeinated/decaffeinated beverages or chocolate 12 hours prior to your test. Do not take any antihistamines 12 hours prior to your test.  On the Day of the Test: Drink plenty of water until 1 hour prior to the test. Do not eat any food 1 hour prior to test. You may take your regular medications prior to the test.  Take metoprolol (Lopressor) two hours prior to test. If you take Furosemide/Hydrochlorothiazide/Spironolactone/Chlorthalidone, please HOLD on the morning of the test. Patients who wear a continuous glucose monitor MUST remove the device prior to scanning.      After the Test: Drink plenty of water. After receiving IV contrast, you may experience a mild flushed feeling. This is normal. On occasion, you may experience a mild rash up to 24 hours after the test. This is not dangerous. If this occurs, you can take Benadryl 25 mg, Zyrtec, Claritin, or Allegra and increase your fluid intake. (Patients taking Tikosyn should avoid Benadryl, and may take Zyrtec, Claritin, or Allegra) If you experience trouble breathing, this can be serious. If it is severe call 911 IMMEDIATELY. If it is mild, please call our office.  We will call to schedule your test 2-4 weeks out understanding that some insurance companies will need an authorization prior to the service being performed.   For more information and frequently asked questions, please visit our website : [email or web address]  For non-scheduling related  questions, please contact the cardiac imaging nurse navigator should you have any questions/concerns: Cardiac Imaging Nurse Navigators Direct Office Dial: [phone]   For scheduling needs, including cancellations and rescheduling, please call [place], [phone].   Follow-Up: At [facility], you and your health needs are our priority.  As part of our continuing mission to provide you with exceptional heart care, our providers are all part of one team.  This team includes your primary Cardiologist (physician) and Advanced Practice Providers or APPs (Physician Assistants and Nurse Practitioners) who all work together to provide you with the care you need, when you need it.  Your next appointment:   3 month(s)  Provider:   You may see Dr. Junnie Olives or one of the following Advanced Practice Providers on your designated Care Team:   Laneta Pintos, NP Gildardo Labrador, PA-C Varney Gentleman, PA-C Cadence Livingston, PA-C Ronald Cockayne, NP Morey Ar, NP    We recommend signing up for the patient portal called "MyChart".  Sign up information is provided on this After Visit Summary.  MyChart is used to  connect with patients for Virtual Visits (Telemedicine).  Patients are able to view lab/test results, encounter notes, upcoming appointments, etc.  Non-urgent messages can be sent to your provider as well.   To learn more about what you can do with MyChart, go to [email or web address].         Signed, Constancia Delton, MD  08/29/2023 12:35 PM    [facility]

## 2023-08-30 ENCOUNTER — Ambulatory Visit (INDEPENDENT_AMBULATORY_CARE_PROVIDER_SITE_OTHER): Admitting: Vascular Surgery

## 2023-08-30 ENCOUNTER — Telehealth (INDEPENDENT_AMBULATORY_CARE_PROVIDER_SITE_OTHER): Payer: Self-pay | Admitting: Vascular Surgery

## 2023-08-30 VITALS — BP 126/76 | HR 77 | Resp 16 | Ht 72.0 in | Wt 190.8 lb

## 2023-08-30 DIAGNOSIS — I10 Essential (primary) hypertension: Secondary | ICD-10-CM

## 2023-08-30 DIAGNOSIS — I6522 Occlusion and stenosis of left carotid artery: Secondary | ICD-10-CM

## 2023-08-30 DIAGNOSIS — E785 Hyperlipidemia, unspecified: Secondary | ICD-10-CM

## 2023-08-30 LAB — BASIC METABOLIC PANEL WITH GFR
BUN/Creatinine Ratio: 16 (ref 10–24)
BUN: 16 mg/dL (ref 8–27)
CO2: 25 mmol/L (ref 20–29)
Calcium: 10 mg/dL (ref 8.6–10.2)
Chloride: 101 mmol/L (ref 96–106)
Creatinine, Ser: 1.02 mg/dL (ref 0.76–1.27)
Glucose: 89 mg/dL (ref 70–99)
Potassium: 4.9 mmol/L (ref 3.5–5.2)
Sodium: 138 mmol/L (ref 134–144)
eGFR: 82 mL/min/{1.73_m2} (ref >59)

## 2023-08-30 MED ORDER — CLOPIDOGREL BISULFATE 75 MG PO TABS: 75.0000 mg | ORAL_TABLET | Freq: Every day | ORAL | 6 refills | Status: DC

## 2023-08-30 NOTE — Assessment & Plan Note (Signed)
 lipid control important in reducing the progression of atherosclerotic disease. Continue statin therapy

## 2023-08-30 NOTE — Telephone Encounter (Signed)
 Patient will be dropping off a disc with medical images for JD at [facility] front desk- please get disc to JD.

## 2023-08-30 NOTE — Addendum Note (Signed)
 Addended by: [employer] on: 08/30/2023 03:23 PM   Modules accepted: Orders

## 2023-08-30 NOTE — Progress Notes (Signed)
 Patient ID: Juan Salas, male   DOB: [birth year], 63 y.o.   MRN: [id number]  Chief Complaint  Patient presents with   Venous Insufficiency    HPI Juan Salas is a 63 y.o. male.  I am asked to see the patient by Dr. Geralyn Knee for evaluation of carotid artery stenosis.  Patient has an extremely strong family history of early cardiovascular disease with strokes in multiple grandparents and severe high cholesterol in both parents and grandparents.  He had an episode a few weeks ago where he was chopping wood and then felt extremely faint and somewhat dizzy.  He thought maybe his blood sugar but using juice and crackers did not make him feel any better.  His wife says he really has not felt better since then.  He is referred for his carotid disease.  He had a carotid ultrasound done several months ago that suggested carotid disease.  This was done at [facility] clinic and I cannot see the study.  He had a CT angiogram done at an [facility] center last month.  Although I cannot see the images to review them, the report is of a 95% left internal carotid artery stenosis cervical portion.  As such, he is referred for further evaluation and treatment.  He was started on aspirin therapy.  He is already on a statin agent.     Past Medical History:  Diagnosis Date   Fibromyalgia    GAD (generalized anxiety disorder)    GERD without esophagitis 05/26/2023   History of diverticulitis 05/26/2023   History of kidney stones 1977, 1983,2000   passed on his own. 7 different cycles of stones during youth.   Hyperlipidemia    Left carotid artery stenosis 05/26/2023   Migraine headache    Nocturia associated with benign prostatic hyperplasia 12/2017   Spinal stenosis     Past Surgical History:  Procedure Laterality Date   APPENDECTOMY  2000   COLONOSCOPY     COLONOSCOPY WITH PROPOFOL  N/A 09/06/2019   Procedure: COLONOSCOPY WITH PROPOFOL ;  Surgeon: Luke Salaam, MD;  Location: [facility] ENDOSCOPY;   Service: Gastroenterology;  Laterality: N/A;   CYSTOSCOPY WITH INSERTION OF UROLIFT N/A 01/17/2018   Procedure: CYSTOSCOPY WITH INSERTION OF UROLIFT;  Surgeon: Geraline Knapp, MD;  Location: [facility] ORS;  Service: Urology;  Laterality: N/A;   CYSTOSCOPY WITH INSERTION OF UROLIFT  01/2018   CYSTOSCOPY WITH INSERTION OF UROLIFT Bilateral    HERNIA REPAIR Left 2000   double inguinal hernia repairs   HYDROCELE EXCISION Left 10/06/2021   Procedure: HYDROCELECTOMY ADULT GROIN/UNILATERAL SCROTAL APPROACH;  Surgeon: Geraline Knapp, MD;  Location: [facility] ORS;  Service: Urology;  Laterality: Left;   LUMBAR LAMINECTOMY     laminectomy x 2. 1990, 1994   NASAL SEPTUM SURGERY  1978   repair of sinuses also   TESTICULAR TORSION REPAIR  2005   TONSILLECTOMY  1970     Family History  Problem Relation Age of Onset   Hypertension Mother    Prostatitis Father    Hypertension Father      Social History   Tobacco Use   Smoking status: Never   Smokeless tobacco: Never  Vaping Use   Vaping status: Never Used  Substance Use Topics   Alcohol use: No   Drug use: No     Allergies  Allergen Reactions   Atorvastatin  Other (See Comments)    Joint pain Joint pain    Prednisone Other (See Comments)  After 3 days agitation and sleeplessness After 3 days agitation and sleeplessness    Verapamil  Nausea Only    Other reaction(s): Headache insomnia insomnia    Metoprolol Palpitations    Per patient, can't tolerate. Per patient, can't tolerate.    Seasonal Ic [Cholestatin] Other (See Comments)    Sinus issues   Sulfa  Antibiotics Rash    Current Outpatient Medications  Medication Sig Dispense Refill   acetaminophen  (TYLENOL ) 500 MG tablet Take 500 mg by mouth every 4 (four) hours as needed for moderate pain or headache.      albuterol  (VENTOLIN  HFA) 108 (90 Base) MCG/ACT inhaler Inhale 2 puffs into the lungs every 6 (six) hours as needed for wheezing or shortness of breath. 18 g 3    aspirin EC 81 MG tablet Take 81 mg by mouth daily. Swallow whole.     calcium  carbonate (TUMS - DOSED IN MG ELEMENTAL CALCIUM ) 500 MG chewable tablet Chew 1 tablet by mouth 2 (two) times daily as needed for indigestion or heartburn.     Cholecalciferol (VITAMIN D3) 50 MCG (2000 UT) TABS Take 1 tablet by mouth daily at 6 (six) AM.     cyanocobalamin 2000 MCG tablet Take 2,000 mcg by mouth daily.     L-THEANINE PO Take 200 mg by mouth daily as needed (stress).     lisinopril  (ZESTRIL ) 5 MG tablet Take 5 mg by mouth at bedtime.     Magnesium 250 MG TABS Take 250 mg by mouth daily as needed.     metoprolol tartrate (LOPRESSOR) 100 MG tablet TAKE 1 TABLET 2 HR PRIOR TO CARDIAC PROCEDURE 1 tablet 0   montelukast  (SINGULAIR ) 10 MG tablet Take 1 tablet (10 mg total) by mouth at bedtime. 30 tablet 3   Multiple Vitamin (MULTIVITAMIN WITH MINERALS) TABS tablet Take 1 tablet by mouth daily.     naproxen (NAPROSYN) 250 MG tablet Take 250 mg by mouth as needed.     Omega-3 Fatty Acids (FISH OIL) 1000 MG CAPS Take 1,000 mg by mouth daily.     pantoprazole (PROTONIX) 40 MG tablet Take 40 mg by mouth daily.     rosuvastatin (CRESTOR) 40 MG tablet Take 1 tablet (40 mg total) by mouth every morning. 90 tablet 3   tadalafil (CIALIS) 20 MG tablet Take 20 mg by mouth daily as needed.     No current facility-administered medications for this visit.      REVIEW OF SYSTEMS (Negative unless checked)  Constitutional: [] Weight loss  [] Fever  [] Chills Cardiac: [] Chest pain   [] Chest pressure   [] Palpitations   [] Shortness of breath when laying flat   [] Shortness of breath at rest   [] Shortness of breath with exertion. Vascular:  [] Pain in legs with walking   [] Pain in legs at rest   [] Pain in legs when laying flat   [] Claudication   [] Pain in feet when walking  [] Pain in feet at rest  [] Pain in feet when laying flat   [] History of DVT   [] Phlebitis   [] Swelling in legs   [] Varicose veins   [] Non-healing  ulcers Pulmonary:   [] Uses home oxygen   [] Productive cough   [] Hemoptysis   [] Wheeze  [] COPD   [] Asthma Neurologic:  [x] Dizziness  [] Blackouts   [] Seizures   [] History of stroke   [] History of TIA  [] Aphasia   [] Temporary blindness   [] Dysphagia   [] Weakness or numbness in arms   [] Weakness or numbness in legs Musculoskeletal:  [] Arthritis   [] Joint  swelling   [] Joint pain   [] Low back pain Hematologic:  [] Easy bruising  [] Easy bleeding   [] Hypercoagulable state   [] Anemic  [] Hepatitis Gastrointestinal:  [] Blood in stool   [] Vomiting blood  [x] Gastroesophageal reflux/heartburn   [] Abdominal pain Genitourinary:  [] Chronic kidney disease   [] Difficult urination  [] Frequent urination  [] Burning with urination   [] Hematuria Skin:  [] Rashes   [] Ulcers   [] Wounds Psychological:  [x] History of anxiety   []  History of major depression.    Physical Exam BP 126/76 (BP Location: Left Arm, Patient Position: Sitting, Cuff Size: Normal)   Pulse 77   Resp 16   Ht 6' (1.829 m)   Wt 190 lb 12.8 oz (86.5 kg)   BMI 25.88 kg/m  Gen:  WD/WN, NAD. Appears younger than stated age. Head: [place]/AT, No temporalis wasting.  Ear/Nose/Throat: Hearing grossly intact, nares w/o erythema or drainage, oropharynx w/o Erythema/Exudate Eyes: Conjunctiva clear, sclera non-icteric  Neck: trachea midline.  No JVD.  Pulmonary:  Good air movement, respirations not labored, no use of accessory muscles  Cardiac: RRR, no JVD Vascular:  Vessel Right Left  Radial Palpable Palpable                                   Gastrointestinal:. No masses, surgical incisions, or scars. Musculoskeletal: M/S 5/5 throughout.  Extremities without ischemic changes.  No deformity or atrophy. No edema. Neurologic: Sensation grossly intact in extremities.  Symmetrical.  Speech is fluent. Motor exam as listed above. Psychiatric: Judgment intact, Mood & affect appropriate for pt's clinical situation. Dermatologic: No rashes or ulcers noted.   No cellulitis or open wounds.    Radiology CT Angio Chest PE W and/or Wo Contrast Result Date: 08/26/2023 CLINICAL DATA:  Pulmonary embolism (PE) suspected, high prob. Chest tightness and shortness of breath. EXAM: CT ANGIOGRAPHY CHEST WITH CONTRAST TECHNIQUE: Multidetector CT imaging of the chest was performed using the standard protocol during bolus administration of intravenous contrast. Multiplanar CT image reconstructions and MIPs were obtained to evaluate the vascular anatomy. RADIATION DOSE REDUCTION: This exam was performed according to the departmental dose-optimization program which includes automated exposure control, adjustment of the mA and/or kV according to patient size and/or use of iterative reconstruction technique. CONTRAST:  75mL OMNIPAQUE IOHEXOL 350 MG/ML SOLN COMPARISON:  None Available. FINDINGS: Cardiovascular: No evidence of embolism to the proximal subsegmental pulmonary artery level. Mild cardiomegaly. No pericardial effusion. No aortic aneurysm. There are coronary artery calcifications, in keeping with coronary artery disease. There are also mild peripheral atherosclerotic vascular calcifications of thoracic aorta and its major branches. Mediastinum/Nodes: Visualized thyroid gland appears grossly unremarkable. No solid / cystic mediastinal masses. The esophagus is nondistended precluding optimal assessment. No axillary, mediastinal or hilar lymphadenopathy by size criteria. Lungs/Pleura: The central tracheo-bronchial tree is patent. There are dependent changes in bilateral lungs. No mass or consolidation. No pleural effusion or pneumothorax. No suspicious lung nodules. There is a 2 mm probable calcified nodule in the lingular segment of left upper lobe (series 6, image 81). Upper Abdomen: Visualized upper abdominal viscera within normal limits. Musculoskeletal: The visualized soft tissues of the chest wall are grossly unremarkable. No suspicious osseous lesions. There are mild  multilevel degenerative changes in the visualized spine. Review of the MIP images confirms the above findings. IMPRESSION: 1. No embolism to the proximal subsegmental pulmonary artery level. 2. No lung mass, consolidation, pleural effusion or pneumothorax. 3. Multiple other nonacute observations,  as described above. Aortic Atherosclerosis (ICD10-I70.0). Electronically Signed   By: Beula Brunswick M.D.   On: 08/26/2023 14:44   DG Chest 2 View Result Date: 08/26/2023 CLINICAL DATA:  Shortness of breath EXAM: CHEST - 2 VIEW COMPARISON:  None Available. FINDINGS: The heart size and mediastinal contours are within normal limits. No consolidation, pneumothorax or effusion. No edema. Degenerative changes seen along the spine. IMPRESSION: No acute cardiopulmonary disease. Electronically Signed   By: Adrianna Horde M.D.   On: 08/26/2023 13:05    Labs Recent Results (from the past 2160 hours)  POC COVID-19     Status: Normal   Collection Time: 08/18/23  2:56 PM  Result Value Ref Range   SARS Coronavirus 2 Ag Negative Negative  TSH     Status: None   Collection Time: 08/25/23  2:30 PM  Result Value Ref Range   TSH 2.67 0.35 - 5.50 uIU/mL  Comprehensive metabolic panel with GFR     Status: None   Collection Time: 08/25/23  2:30 PM  Result Value Ref Range   Sodium 138 135 - 145 mEq/L   Potassium 4.0 3.5 - 5.1 mEq/L   Chloride 100 96 - 112 mEq/L   CO2 28 19 - 32 mEq/L   Glucose, Bld 97 70 - 99 mg/dL   BUN 17 6 - 23 mg/dL   Creatinine, Ser 4.09 0.40 - 1.50 mg/dL   Total Bilirubin 0.4 0.2 - 1.2 mg/dL   Alkaline Phosphatase 59 39 - 117 U/L   AST 22 0 - 37 U/L   ALT 26 0 - 53 U/L   Total Protein 7.0 6.0 - 8.3 g/dL   Albumin 4.8 3.5 - 5.2 g/dL   GFR 81.19 >14.78 mL/min    Comment: Calculated using the CKD-EPI Creatinine Equation (2021)   Calcium  9.9 8.4 - 10.5 mg/dL  Lipid panel     Status: Abnormal   Collection Time: 08/25/23  2:30 PM  Result Value Ref Range   Cholesterol 242 (H) 0 - 200 mg/dL     Comment: ATP III Classification       Desirable:  < 200 mg/dL               Borderline High:  200 - 239 mg/dL          High:  > = 295 mg/dL   Triglycerides 621.3 (H) 0.0 - 149.0 mg/dL    Comment: Normal:  <086 mg/dLBorderline High:  150 - 199 mg/dL   HDL 57.84 >69.62 mg/dL   VLDL 95.2 (H) 0.0 - 84.1 mg/dL   LDL Cholesterol 324 (H) 0 - 99 mg/dL   Total CHOL/HDL Ratio 4     Comment:                Men          Women1/2 Average Risk     3.4          3.3Average Risk          5.0          4.42X Average Risk          9.6          7.13X Average Risk          15.0          11.0                       NonHDL 185.02     Comment: NOTE:  Non-HDL goal should be 30 mg/dL higher than patient's LDL goal (i.e. LDL goal of < 70 mg/dL, would have non-HDL goal of < 100 mg/dL)  CBC     Status: None   Collection Time: 08/25/23  2:30 PM  Result Value Ref Range   WBC 4.9 4.0 - 10.5 K/uL   RBC 4.52 4.22 - 5.81 Mil/uL   Platelets 263.0 150.0 - 400.0 K/uL   Hemoglobin 14.4 13.0 - 17.0 g/dL   HCT 16.1 09.6 - 04.5 %   MCV 94.4 78.0 - 100.0 fl   MCHC 33.7 30.0 - 36.0 g/dL   RDW 40.9 81.1 - 91.4 %  Troponin I (High Sensitivity)     Status: Abnormal   Collection Time: 08/25/23  3:32 PM  Result Value Ref Range   High Sens Troponin I 26 (HH) 2 - 17 ng/L  Basic metabolic panel     Status: Abnormal   Collection Time: 08/26/23 11:54 AM  Result Value Ref Range   Sodium 131 (L) 135 - 145 mmol/L   Potassium 3.9 3.5 - 5.1 mmol/L   Chloride 97 (L) 98 - 111 mmol/L   CO2 23 22 - 32 mmol/L   Glucose, Bld 111 (H) 70 - 99 mg/dL    Comment: Glucose reference range applies only to samples taken after fasting for at least 8 hours.   BUN 17 8 - 23 mg/dL   Creatinine, Ser 7.82 0.61 - 1.24 mg/dL   Calcium  9.3 8.9 - 10.3 mg/dL   GFR, Estimated >95 >62 mL/min    Comment: (NOTE) Calculated using the CKD-EPI Creatinine Equation (2021)    Anion gap 11 5 - 15    Comment: Performed at [facility], [address]  [place], [place] [address]  CBC     Status: None   Collection Time: 08/26/23 11:54 AM  Result Value Ref Range   WBC 5.4 4.0 - 10.5 K/uL   RBC 4.63 4.22 - 5.81 MIL/uL   Hemoglobin 14.6 13.0 - 17.0 g/dL   HCT 57.8 46.9 - 62.9 %   MCV 92.2 80.0 - 100.0 fL   MCH 31.5 26.0 - 34.0 pg   MCHC 34.2 30.0 - 36.0 g/dL   RDW 52.8 41.3 - 24.4 %   Platelets 252 150 - 400 K/uL   nRBC 0.0 0.0 - 0.2 %    Comment: Performed at [facility], [address] [place], [place] [address]  Troponin I (High Sensitivity)     Status: Abnormal   Collection Time: 08/26/23 11:54 AM  Result Value Ref Range   Troponin I (High Sensitivity) 27 (H) <18 ng/L    Comment: (NOTE) Elevated high sensitivity troponin I (hsTnI) values and significant  changes across serial measurements may suggest ACS but many other  chronic and acute conditions are known to elevate hsTnI results.  Refer to the "Links" section for chest pain algorithms and additional  guidance. Performed at [facility], [address] [place], [place] [address]   Troponin I (High Sensitivity)     Status: Abnormal   Collection Time: 08/26/23  1:46 PM  Result Value Ref Range   Troponin I (High Sensitivity) 27 (H) <18 ng/L    Comment: (NOTE) Elevated high sensitivity troponin I (hsTnI) values and significant  changes across serial measurements may suggest ACS but many other  chronic and acute conditions are known to elevate hsTnI results.  Refer to the "Links" section for chest pain algorithms and additional  guidance. Performed at  [facility], [address] [place], [place] [address]   TSH     Status: None   Collection Time: 08/26/23  1:46 PM  Result Value Ref Range   TSH 1.881 0.350 - 4.500 uIU/mL    Comment: Performed by a 3rd Generation assay with a functional sensitivity of <=0.01 uIU/mL. Performed at [facility], [address] [place], [place] [address]   Basic metabolic panel with GFR     Status:  None   Collection Time: 08/29/23 10:35 AM  Result Value Ref Range   Glucose 89 70 - 99 mg/dL   BUN 16 8 - 27 mg/dL   Creatinine, Ser 0.98 0.76 - 1.27 mg/dL   eGFR 82 >11 BJ/YNW/2.95   BUN/Creatinine Ratio 16 10 - 24   Sodium 138 134 - 144 mmol/L   Potassium 4.9 3.5 - 5.2 mmol/L   Chloride 101 96 - 106 mmol/L   CO2 25 20 - 29 mmol/L   Calcium  10.0 8.6 - 10.2 mg/dL    Assessment/Plan:  Essential hypertension blood pressure control important in reducing the progression of atherosclerotic disease. On appropriate oral medications.   Hyperlipidemia lipid control important in reducing the progression of atherosclerotic disease. Continue statin therapy   Left carotid artery stenosis He had a CT angiogram done at an [facility] imaging center last month.  Although I cannot see the images to review them, the report is of a 95% left internal carotid artery stenosis cervical portion.  I had a long discussion with he and his wife today regarding the pathophysiology and natural history of carotid disease.  Given this high-grade stenosis, he clearly would benefit from intervention for stroke risk reduction.  I discussed with him the 2 forms of intervention 1 being carotid stent placement and the other being carotid endarterectomy.  Physiologically, he certainly seems well enough to have carotid endarterectomy, but I cannot view his images to assess the anatomy.  I discussed the similarities and differences between carotid artery stenting and carotid endarterectomy and the differences between the 2 procedures.  They are going to try to get the images so that I can review them, he would be more initially proceeding with carotid artery stenting if possible.  I am going to go ahead and prescribe him Plavix that he can begin at this time and leave it on him on for at least 5 days before the procedure.  I will review his images once I receive them to ensure that he has good anatomy for carotid  stenting.      Mikki Alexander 08/30/2023, 2:43 PM   This note was created with Dragon medical transcription system.  Any errors from dictation are unintentional.

## 2023-08-30 NOTE — Assessment & Plan Note (Signed)
 He had a CT angiogram done at an [facility] center last month.  Although I cannot see the images to review them, the report is of a 95% left internal carotid artery stenosis cervical portion.  I had a long discussion with he and his wife today regarding the pathophysiology and natural history of carotid disease.  Given this high-grade stenosis, he clearly would benefit from intervention for stroke risk reduction.  I discussed with him the 2 forms of intervention 1 being carotid stent placement and the other being carotid endarterectomy.  Physiologically, he certainly seems well enough to have carotid endarterectomy, but I cannot view his images to assess the anatomy.  I discussed the similarities and differences between carotid artery stenting and carotid endarterectomy and the differences between the 2 procedures.  They are going to try to get the images so that I can review them, he would be more initially proceeding with carotid artery stenting if possible.  I am going to go ahead and prescribe him Plavix that he can begin at this time and leave it on him on for at least 5 days before the procedure.  I will review his images once I receive them to ensure that he has good anatomy for carotid stenting.

## 2023-08-30 NOTE — Assessment & Plan Note (Signed)
 blood pressure control important in reducing the progression of atherosclerotic disease. On appropriate oral medications.

## 2023-08-30 NOTE — Patient Instructions (Signed)
Carotid Angioplasty With Stent Carotid angioplasty with stent is a procedure to open or widen an artery in the neck (carotid artery) that has become narrowed. This is done by inflating a small balloon inside the artery and then placing a small piece of metal that looks like a coil or spring (stent) inside the artery. The stent helps keep the artery open by supporting the artery walls. The carotid arteries supply blood to the brain. When fats, cholesterol, and other materials (plaque) build up in an artery, the artery becomes narrow and can become blocked. This can reduce or block blood flow to certain areas of the brain, which can cause serious health problems, including stroke. The stent helps to keep the artery open so that blood can flow to the brain. Tell a health care provider about: Any allergies you have. All medicines you are taking, including vitamins, herbs, eye drops, creams, and over-the-counter medicines. Any problems you or family members have had with anesthesia. Any bleeding problems you have. Any surgeries you have had. Any medical conditions you have. Whether you are pregnant or may be pregnant. What are the risks? Your health care provider will talk with you about risks. These may include: Stroke. The stent becoming blocked. Problems at the access site, such as a large amount of blood collecting under your skin (hematoma). Allergic reactions to medicines or dyes. Damage to other structures or organs, or to the carotid artery. Infection. Heart attack. Death. This is rare. What happens before the procedure? Follow instructions from your health care provider about what you may eat and drink. Ask your health care provider about: Changing or stopping your regular medicines. These include any diabetes medicines or blood thinners (anticoagulants) you take. Whether aspirin or other blood thinners are recommended before this procedure. Taking over-the-counter medicines, vitamins,  herbs, and supplements. Do not use any products that contain nicotine or tobacco for at least 4 weeks before the procedure. These products include cigarettes, chewing tobacco, and vaping devices, such as e-cigarettes. If you need help quitting, ask your health care provider. Ask your health care provider: How your surgery site will be marked. What steps will be taken to help prevent infection. These steps may include washing skin with a soap that kills germs. You may have blood tests and imaging tests. What happens during the procedure?  An IV will be inserted into one of your veins. You may be given: A sedative. This helps you relax. Anesthesia. This will: Numb certain areas of your body. Make you fall asleep for surgery. Most commonly, an incision will be made in your groin. In some cases, an incision may be made in your wrist or forearm instead of your groin. A small, thin tube (catheter) will be inserted through an incision and into an artery. The catheter will be threaded upward into your carotid artery. An X-ray machine will help your health care provider guide the catheter to the correct place in your artery. Dye will be injected into the catheter and will travel to the narrow or blocked part of your carotid artery. X-ray images will be taken of how the dye flows through your artery. While the images are being taken, you may be given instructions about breathing, swallowing, moving, or talking. A filter will be inserted into your artery. This will be used to catch plaque that comes loose in your artery during the procedure. This reduces the risk of plaque moving into your brain. A small balloon will be inserted into your artery.  The balloon will be inflated for a few seconds to widen your artery and will then be removed. The stent will be placed in your artery. A second small balloon will be inserted into your artery and inflated. This expands the stent inside of your artery so that the  stent holds the artery walls open. The second balloon will then be removed. The catheter, filter, and first balloon will be removed from your artery and pressure will be held on your carotid artery to stop bleeding. Your incision may be closed with stitches (sutures), skin glue, or adhesive strips. A bandage (dressing) will be placed over your incision. The procedure may vary among health care providers and hospitals. What happens after the procedure? Your blood pressure, heart rate, breathing rate, and blood oxygen level will be monitored until you leave the hospital or clinic. Your mental status and movements (neurological status) will be monitored. You may need to have pressure placed on the incision site to prevent bleeding. You will need to keep the area still for a few hours, or as long as told by your health care provider. If the procedure was done in your groin, you will be told not to bend or cross your legs. Most people stay in the hospital overnight. This information is not intended to replace advice given to you by your health care provider. Make sure you discuss any questions you have with your health care provider. Document Revised: 09/01/2021 Document Reviewed: 09/01/2021 Elsevier Patient Education  2024 [employer].

## 2023-08-30 NOTE — H&P (View-Only) (Signed)
 Patient ID: Juan Salas, male   DOB: [birth year], 63 y.o.   MRN: [id number]  Chief Complaint  Patient presents with   Venous Insufficiency    HPI Juan Salas is a 63 y.o. male.  I am asked to see the patient by Dr. Geralyn Knee for evaluation of carotid artery stenosis.  Patient has an extremely strong family history of early cardiovascular disease with strokes in multiple grandparents and severe high cholesterol in both parents and grandparents.  He had an episode a few weeks ago where he was chopping wood and then felt extremely faint and somewhat dizzy.  He thought maybe his blood sugar but using juice and crackers did not make him feel any better.  His wife says he really has not felt better since then.  He is referred for his carotid disease.  He had a carotid ultrasound done several months ago that suggested carotid disease.  This was done at [facility] clinic and I cannot see the study.  He had a CT angiogram done at an [facility] imaging center last month.  Although I cannot see the images to review them, the report is of a 95% left internal carotid artery stenosis cervical portion.  As such, he is referred for further evaluation and treatment.  He was started on aspirin therapy.  He is already on a statin agent.     Past Medical History:  Diagnosis Date   Fibromyalgia    GAD (generalized anxiety disorder)    GERD without esophagitis 05/26/2023   History of diverticulitis 05/26/2023   History of kidney stones 1977, 1983,2000   passed on his own. 7 different cycles of stones during youth.   Hyperlipidemia    Left carotid artery stenosis 05/26/2023   Migraine headache    Nocturia associated with benign prostatic hyperplasia 12/2017   Spinal stenosis     Past Surgical History:  Procedure Laterality Date   APPENDECTOMY  2000   COLONOSCOPY     COLONOSCOPY WITH PROPOFOL  N/A 09/06/2019   Procedure: COLONOSCOPY WITH PROPOFOL ;  Surgeon: Luke Salaam, MD;  Location: [facility] ENDOSCOPY;   Service: Gastroenterology;  Laterality: N/A;   CYSTOSCOPY WITH INSERTION OF UROLIFT N/A 01/17/2018   Procedure: CYSTOSCOPY WITH INSERTION OF UROLIFT;  Surgeon: Geraline Knapp, MD;  Location: [facility] ORS;  Service: Urology;  Laterality: N/A;   CYSTOSCOPY WITH INSERTION OF UROLIFT  01/2018   CYSTOSCOPY WITH INSERTION OF UROLIFT Bilateral    HERNIA REPAIR Left 2000   double inguinal hernia repairs   HYDROCELE EXCISION Left 10/06/2021   Procedure: HYDROCELECTOMY ADULT GROIN/UNILATERAL SCROTAL APPROACH;  Surgeon: Geraline Knapp, MD;  Location: [facility] ORS;  Service: Urology;  Laterality: Left;   LUMBAR LAMINECTOMY     laminectomy x 2. 1990, 1994   NASAL SEPTUM SURGERY  1978   repair of sinuses also   TESTICULAR TORSION REPAIR  2005   TONSILLECTOMY  1970     Family History  Problem Relation Age of Onset   Hypertension Mother    Prostatitis Father    Hypertension Father      Social History   Tobacco Use   Smoking status: Never   Smokeless tobacco: Never  Vaping Use   Vaping status: Never Used  Substance Use Topics   Alcohol use: No   Drug use: No     Allergies  Allergen Reactions   Atorvastatin  Other (See Comments)    Joint pain Joint pain    Prednisone Other (See Comments)  After 3 days agitation and sleeplessness After 3 days agitation and sleeplessness    Verapamil  Nausea Only    Other reaction(s): Headache insomnia insomnia    Metoprolol Palpitations    Per patient, can't tolerate. Per patient, can't tolerate.    Seasonal Ic [Cholestatin] Other (See Comments)    Sinus issues   Sulfa  Antibiotics Rash    Current Outpatient Medications  Medication Sig Dispense Refill   acetaminophen  (TYLENOL ) 500 MG tablet Take 500 mg by mouth every 4 (four) hours as needed for moderate pain or headache.      albuterol  (VENTOLIN  HFA) 108 (90 Base) MCG/ACT inhaler Inhale 2 puffs into the lungs every 6 (six) hours as needed for wheezing or shortness of breath. 18 g 3    aspirin EC 81 MG tablet Take 81 mg by mouth daily. Swallow whole.     calcium  carbonate (TUMS - DOSED IN MG ELEMENTAL CALCIUM ) 500 MG chewable tablet Chew 1 tablet by mouth 2 (two) times daily as needed for indigestion or heartburn.     Cholecalciferol (VITAMIN D3) 50 MCG (2000 UT) TABS Take 1 tablet by mouth daily at 6 (six) AM.     cyanocobalamin 2000 MCG tablet Take 2,000 mcg by mouth daily.     L-THEANINE PO Take 200 mg by mouth daily as needed (stress).     lisinopril  (ZESTRIL ) 5 MG tablet Take 5 mg by mouth at bedtime.     Magnesium 250 MG TABS Take 250 mg by mouth daily as needed.     metoprolol tartrate (LOPRESSOR) 100 MG tablet TAKE 1 TABLET 2 HR PRIOR TO CARDIAC PROCEDURE 1 tablet 0   montelukast  (SINGULAIR ) 10 MG tablet Take 1 tablet (10 mg total) by mouth at bedtime. 30 tablet 3   Multiple Vitamin (MULTIVITAMIN WITH MINERALS) TABS tablet Take 1 tablet by mouth daily.     naproxen (NAPROSYN) 250 MG tablet Take 250 mg by mouth as needed.     Omega-3 Fatty Acids (FISH OIL) 1000 MG CAPS Take 1,000 mg by mouth daily.     pantoprazole (PROTONIX) 40 MG tablet Take 40 mg by mouth daily.     rosuvastatin (CRESTOR) 40 MG tablet Take 1 tablet (40 mg total) by mouth every morning. 90 tablet 3   tadalafil (CIALIS) 20 MG tablet Take 20 mg by mouth daily as needed.     No current facility-administered medications for this visit.      REVIEW OF SYSTEMS (Negative unless checked)  Constitutional: [] Weight loss  [] Fever  [] Chills Cardiac: [] Chest pain   [] Chest pressure   [] Palpitations   [] Shortness of breath when laying flat   [] Shortness of breath at rest   [] Shortness of breath with exertion. Vascular:  [] Pain in legs with walking   [] Pain in legs at rest   [] Pain in legs when laying flat   [] Claudication   [] Pain in feet when walking  [] Pain in feet at rest  [] Pain in feet when laying flat   [] History of DVT   [] Phlebitis   [] Swelling in legs   [] Varicose veins   [] Non-healing  ulcers Pulmonary:   [] Uses home oxygen   [] Productive cough   [] Hemoptysis   [] Wheeze  [] COPD   [] Asthma Neurologic:  [x] Dizziness  [] Blackouts   [] Seizures   [] History of stroke   [] History of TIA  [] Aphasia   [] Temporary blindness   [] Dysphagia   [] Weakness or numbness in arms   [] Weakness or numbness in legs Musculoskeletal:  [] Arthritis   [] Joint  swelling   [] Joint pain   [] Low back pain Hematologic:  [] Easy bruising  [] Easy bleeding   [] Hypercoagulable state   [] Anemic  [] Hepatitis Gastrointestinal:  [] Blood in stool   [] Vomiting blood  [x] Gastroesophageal reflux/heartburn   [] Abdominal pain Genitourinary:  [] Chronic kidney disease   [] Difficult urination  [] Frequent urination  [] Burning with urination   [] Hematuria Skin:  [] Rashes   [] Ulcers   [] Wounds Psychological:  [x] History of anxiety   []  History of major depression.    Physical Exam BP 126/76 (BP Location: Left Arm, Patient Position: Sitting, Cuff Size: Normal)   Pulse 77   Resp 16   Ht 6' (1.829 m)   Wt 190 lb 12.8 oz (86.5 kg)   BMI 25.88 kg/m  Gen:  WD/WN, NAD. Appears younger than stated age. Head: [place]/AT, No temporalis wasting.  Ear/Nose/Throat: Hearing grossly intact, nares w/o erythema or drainage, oropharynx w/o Erythema/Exudate Eyes: Conjunctiva clear, sclera non-icteric  Neck: trachea midline.  No JVD.  Pulmonary:  Good air movement, respirations not labored, no use of accessory muscles  Cardiac: RRR, no JVD Vascular:  Vessel Right Left  Radial Palpable Palpable                                   Gastrointestinal:. No masses, surgical incisions, or scars. Musculoskeletal: M/S 5/5 throughout.  Extremities without ischemic changes.  No deformity or atrophy. No edema. Neurologic: Sensation grossly intact in extremities.  Symmetrical.  Speech is fluent. Motor exam as listed above. Psychiatric: Judgment intact, Mood & affect appropriate for pt's clinical situation. Dermatologic: No rashes or ulcers noted.   No cellulitis or open wounds.    Radiology CT Angio Chest PE W and/or Wo Contrast Result Date: 08/26/2023 CLINICAL DATA:  Pulmonary embolism (PE) suspected, high prob. Chest tightness and shortness of breath. EXAM: CT ANGIOGRAPHY CHEST WITH CONTRAST TECHNIQUE: Multidetector CT imaging of the chest was performed using the standard protocol during bolus administration of intravenous contrast. Multiplanar CT image reconstructions and MIPs were obtained to evaluate the vascular anatomy. RADIATION DOSE REDUCTION: This exam was performed according to the departmental dose-optimization program which includes automated exposure control, adjustment of the mA and/or kV according to patient size and/or use of iterative reconstruction technique. CONTRAST:  75mL OMNIPAQUE IOHEXOL 350 MG/ML SOLN COMPARISON:  None Available. FINDINGS: Cardiovascular: No evidence of embolism to the proximal subsegmental pulmonary artery level. Mild cardiomegaly. No pericardial effusion. No aortic aneurysm. There are coronary artery calcifications, in keeping with coronary artery disease. There are also mild peripheral atherosclerotic vascular calcifications of thoracic aorta and its major branches. Mediastinum/Nodes: Visualized thyroid gland appears grossly unremarkable. No solid / cystic mediastinal masses. The esophagus is nondistended precluding optimal assessment. No axillary, mediastinal or hilar lymphadenopathy by size criteria. Lungs/Pleura: The central tracheo-bronchial tree is patent. There are dependent changes in bilateral lungs. No mass or consolidation. No pleural effusion or pneumothorax. No suspicious lung nodules. There is a 2 mm probable calcified nodule in the lingular segment of left upper lobe (series 6, image 81). Upper Abdomen: Visualized upper abdominal viscera within normal limits. Musculoskeletal: The visualized soft tissues of the chest wall are grossly unremarkable. No suspicious osseous lesions. There are mild  multilevel degenerative changes in the visualized spine. Review of the MIP images confirms the above findings. IMPRESSION: 1. No embolism to the proximal subsegmental pulmonary artery level. 2. No lung mass, consolidation, pleural effusion or pneumothorax. 3. Multiple other nonacute observations,  as described above. Aortic Atherosclerosis (ICD10-I70.0). Electronically Signed   By: Beula Brunswick M.D.   On: 08/26/2023 14:44   DG Chest 2 View Result Date: 08/26/2023 CLINICAL DATA:  Shortness of breath EXAM: CHEST - 2 VIEW COMPARISON:  None Available. FINDINGS: The heart size and mediastinal contours are within normal limits. No consolidation, pneumothorax or effusion. No edema. Degenerative changes seen along the spine. IMPRESSION: No acute cardiopulmonary disease. Electronically Signed   By: Adrianna Horde M.D.   On: 08/26/2023 13:05    Labs Recent Results (from the past 2160 hours)  POC COVID-19     Status: Normal   Collection Time: 08/18/23  2:56 PM  Result Value Ref Range   SARS Coronavirus 2 Ag Negative Negative  TSH     Status: None   Collection Time: 08/25/23  2:30 PM  Result Value Ref Range   TSH 2.67 0.35 - 5.50 uIU/mL  Comprehensive metabolic panel with GFR     Status: None   Collection Time: 08/25/23  2:30 PM  Result Value Ref Range   Sodium 138 135 - 145 mEq/L   Potassium 4.0 3.5 - 5.1 mEq/L   Chloride 100 96 - 112 mEq/L   CO2 28 19 - 32 mEq/L   Glucose, Bld 97 70 - 99 mg/dL   BUN 17 6 - 23 mg/dL   Creatinine, Ser 4.09 0.40 - 1.50 mg/dL   Total Bilirubin 0.4 0.2 - 1.2 mg/dL   Alkaline Phosphatase 59 39 - 117 U/L   AST 22 0 - 37 U/L   ALT 26 0 - 53 U/L   Total Protein 7.0 6.0 - 8.3 g/dL   Albumin 4.8 3.5 - 5.2 g/dL   GFR 81.19 >14.78 mL/min    Comment: Calculated using the CKD-EPI Creatinine Equation (2021)   Calcium  9.9 8.4 - 10.5 mg/dL  Lipid panel     Status: Abnormal   Collection Time: 08/25/23  2:30 PM  Result Value Ref Range   Cholesterol 242 (H) 0 - 200 mg/dL     Comment: ATP III Classification       Desirable:  < 200 mg/dL               Borderline High:  200 - 239 mg/dL          High:  > = 295 mg/dL   Triglycerides 621.3 (H) 0.0 - 149.0 mg/dL    Comment: Normal:  <086 mg/dLBorderline High:  150 - 199 mg/dL   HDL 57.84 >69.62 mg/dL   VLDL 95.2 (H) 0.0 - 84.1 mg/dL   LDL Cholesterol 324 (H) 0 - 99 mg/dL   Total CHOL/HDL Ratio 4     Comment:                Men          Women1/2 Average Risk     3.4          3.3Average Risk          5.0          4.42X Average Risk          9.6          7.13X Average Risk          15.0          11.0                       NonHDL 185.02     Comment: NOTE:  Non-HDL goal should be 30 mg/dL higher than patient's LDL goal (i.e. LDL goal of < 70 mg/dL, would have non-HDL goal of < 100 mg/dL)  CBC     Status: None   Collection Time: 08/25/23  2:30 PM  Result Value Ref Range   WBC 4.9 4.0 - 10.5 K/uL   RBC 4.52 4.22 - 5.81 Mil/uL   Platelets 263.0 150.0 - 400.0 K/uL   Hemoglobin 14.4 13.0 - 17.0 g/dL   HCT 16.1 09.6 - 04.5 %   MCV 94.4 78.0 - 100.0 fl   MCHC 33.7 30.0 - 36.0 g/dL   RDW 40.9 81.1 - 91.4 %  Troponin I (High Sensitivity)     Status: Abnormal   Collection Time: 08/25/23  3:32 PM  Result Value Ref Range   High Sens Troponin I 26 (HH) 2 - 17 ng/L  Basic metabolic panel     Status: Abnormal   Collection Time: 08/26/23 11:54 AM  Result Value Ref Range   Sodium 131 (L) 135 - 145 mmol/L   Potassium 3.9 3.5 - 5.1 mmol/L   Chloride 97 (L) 98 - 111 mmol/L   CO2 23 22 - 32 mmol/L   Glucose, Bld 111 (H) 70 - 99 mg/dL    Comment: Glucose reference range applies only to samples taken after fasting for at least 8 hours.   BUN 17 8 - 23 mg/dL   Creatinine, Ser 7.82 0.61 - 1.24 mg/dL   Calcium  9.3 8.9 - 10.3 mg/dL   GFR, Estimated >95 >62 mL/min    Comment: (NOTE) Calculated using the CKD-EPI Creatinine Equation (2021)    Anion gap 11 5 - 15    Comment: Performed at [facility], [address]  [place], [place] [address]  CBC     Status: None   Collection Time: 08/26/23 11:54 AM  Result Value Ref Range   WBC 5.4 4.0 - 10.5 K/uL   RBC 4.63 4.22 - 5.81 MIL/uL   Hemoglobin 14.6 13.0 - 17.0 g/dL   HCT 57.8 46.9 - 62.9 %   MCV 92.2 80.0 - 100.0 fL   MCH 31.5 26.0 - 34.0 pg   MCHC 34.2 30.0 - 36.0 g/dL   RDW 52.8 41.3 - 24.4 %   Platelets 252 150 - 400 K/uL   nRBC 0.0 0.0 - 0.2 %    Comment: Performed at [facility], [address] [place], [place] [address]  Troponin I (High Sensitivity)     Status: Abnormal   Collection Time: 08/26/23 11:54 AM  Result Value Ref Range   Troponin I (High Sensitivity) 27 (H) <18 ng/L    Comment: (NOTE) Elevated high sensitivity troponin I (hsTnI) values and significant  changes across serial measurements may suggest ACS but many other  chronic and acute conditions are known to elevate hsTnI results.  Refer to the "Links" section for chest pain algorithms and additional  guidance. Performed at [facility], [address] [place], [place] [address]   Troponin I (High Sensitivity)     Status: Abnormal   Collection Time: 08/26/23  1:46 PM  Result Value Ref Range   Troponin I (High Sensitivity) 27 (H) <18 ng/L    Comment: (NOTE) Elevated high sensitivity troponin I (hsTnI) values and significant  changes across serial measurements may suggest ACS but many other  chronic and acute conditions are known to elevate hsTnI results.  Refer to the "Links" section for chest pain algorithms and additional  guidance. Performed at  [facility], [address] [place], [place] [address]   TSH     Status: None   Collection Time: 08/26/23  1:46 PM  Result Value Ref Range   TSH 1.881 0.350 - 4.500 uIU/mL    Comment: Performed by a 3rd Generation assay with a functional sensitivity of <=0.01 uIU/mL. Performed at [facility], [address] [place], [place] [address]   Basic metabolic panel with GFR     Status:  None   Collection Time: 08/29/23 10:35 AM  Result Value Ref Range   Glucose 89 70 - 99 mg/dL   BUN 16 8 - 27 mg/dL   Creatinine, Ser 0.98 0.76 - 1.27 mg/dL   eGFR 82 >11 BJ/YNW/2.95   BUN/Creatinine Ratio 16 10 - 24   Sodium 138 134 - 144 mmol/L   Potassium 4.9 3.5 - 5.2 mmol/L   Chloride 101 96 - 106 mmol/L   CO2 25 20 - 29 mmol/L   Calcium  10.0 8.6 - 10.2 mg/dL    Assessment/Plan:  Essential hypertension blood pressure control important in reducing the progression of atherosclerotic disease. On appropriate oral medications.   Hyperlipidemia lipid control important in reducing the progression of atherosclerotic disease. Continue statin therapy   Left carotid artery stenosis He had a CT angiogram done at an [facility] imaging center last month.  Although I cannot see the images to review them, the report is of a 95% left internal carotid artery stenosis cervical portion.  I had a long discussion with he and his wife today regarding the pathophysiology and natural history of carotid disease.  Given this high-grade stenosis, he clearly would benefit from intervention for stroke risk reduction.  I discussed with him the 2 forms of intervention 1 being carotid stent placement and the other being carotid endarterectomy.  Physiologically, he certainly seems well enough to have carotid endarterectomy, but I cannot view his images to assess the anatomy.  I discussed the similarities and differences between carotid artery stenting and carotid endarterectomy and the differences between the 2 procedures.  They are going to try to get the images so that I can review them, he would be more initially proceeding with carotid artery stenting if possible.  I am going to go ahead and prescribe him Plavix that he can begin at this time and leave it on him on for at least 5 days before the procedure.  I will review his images once I receive them to ensure that he has good anatomy for carotid  stenting.      Mikki Alexander 08/30/2023, 2:43 PM   This note was created with Dragon medical transcription system.  Any errors from dictation are unintentional.

## 2023-08-31 ENCOUNTER — Telehealth (INDEPENDENT_AMBULATORY_CARE_PROVIDER_SITE_OTHER): Payer: Self-pay

## 2023-09-01 NOTE — Telephone Encounter (Signed)
 Patient dropped off disc with medical images for JD at [facility] front desk

## 2023-09-05 ENCOUNTER — Telehealth (INDEPENDENT_AMBULATORY_CARE_PROVIDER_SITE_OTHER): Payer: Self-pay

## 2023-09-05 NOTE — Telephone Encounter (Signed)
 Spoke with the patient and he is scheduled with Dr. Vonna Guardian ib 09/08/23 with a 9:45 am arrival time to the [facility] for a left carotid stent placement. Pre-procedure instructions were dicussed and will be sent to Mychart.

## 2023-09-06 ENCOUNTER — Ambulatory Visit: Attending: Cardiology

## 2023-09-06 ENCOUNTER — Encounter (INDEPENDENT_AMBULATORY_CARE_PROVIDER_SITE_OTHER): Payer: Self-pay

## 2023-09-06 DIAGNOSIS — R072 Precordial pain: Secondary | ICD-10-CM

## 2023-09-06 LAB — ECHOCARDIOGRAM COMPLETE
AR max vel: 3.32 cm2
AV Area VTI: 3.15 cm2
AV Area mean vel: 3.18 cm2
AV Mean grad: 4 mmHg
AV Peak grad: 7.3 mmHg
Ao pk vel: 1.35 m/s
Area-P 1/2: 4.39 cm2
Calc EF: 61.3 %
S' Lateral: 3.8 cm
Single Plane A2C EF: 63.3 %
Single Plane A4C EF: 57.3 %

## 2023-09-07 ENCOUNTER — Ambulatory Visit: Payer: Self-pay | Admitting: Cardiology

## 2023-09-08 ENCOUNTER — Other Ambulatory Visit: Payer: Self-pay

## 2023-09-08 ENCOUNTER — Inpatient Hospital Stay
Admission: RE | Admit: 2023-09-08 | Discharge: 2023-09-09 | DRG: 036 | Disposition: A | Discharge status: Home / Self Care | Attending: Vascular Surgery | Admitting: Vascular Surgery

## 2023-09-08 ENCOUNTER — Encounter
Admission: RE | Disposition: A | Discharge status: Home / Self Care | Payer: Self-pay | Source: Home / Self Care | Attending: Vascular Surgery

## 2023-09-08 ENCOUNTER — Encounter: Payer: Self-pay | Admitting: Vascular Surgery

## 2023-09-08 DIAGNOSIS — Q2549 Other congenital malformations of aorta: Secondary | ICD-10-CM

## 2023-09-08 DIAGNOSIS — K219 Gastro-esophageal reflux disease without esophagitis: Secondary | ICD-10-CM | POA: Diagnosis present

## 2023-09-08 DIAGNOSIS — Z823 Family history of stroke: Secondary | ICD-10-CM | POA: Diagnosis not present

## 2023-09-08 DIAGNOSIS — Z79899 Other long term (current) drug therapy: Secondary | ICD-10-CM | POA: Diagnosis not present

## 2023-09-08 DIAGNOSIS — E785 Hyperlipidemia, unspecified: Secondary | ICD-10-CM | POA: Diagnosis present

## 2023-09-08 DIAGNOSIS — I872 Venous insufficiency (chronic) (peripheral): Secondary | ICD-10-CM | POA: Diagnosis present

## 2023-09-08 DIAGNOSIS — I708 Atherosclerosis of other arteries: Secondary | ICD-10-CM

## 2023-09-08 DIAGNOSIS — N400 Enlarged prostate without lower urinary tract symptoms: Secondary | ICD-10-CM | POA: Diagnosis present

## 2023-09-08 DIAGNOSIS — Z7902 Long term (current) use of antithrombotics/antiplatelets: Secondary | ICD-10-CM | POA: Diagnosis not present

## 2023-09-08 DIAGNOSIS — M797 Fibromyalgia: Secondary | ICD-10-CM | POA: Diagnosis present

## 2023-09-08 DIAGNOSIS — Z006 Encounter for examination for normal comparison and control in clinical research program: Secondary | ICD-10-CM

## 2023-09-08 DIAGNOSIS — Z7982 Long term (current) use of aspirin: Secondary | ICD-10-CM

## 2023-09-08 DIAGNOSIS — I6522 Occlusion and stenosis of left carotid artery: Secondary | ICD-10-CM | POA: Diagnosis present

## 2023-09-08 DIAGNOSIS — Z8249 Family history of ischemic heart disease and other diseases of the circulatory system: Secondary | ICD-10-CM

## 2023-09-08 DIAGNOSIS — I10 Essential (primary) hypertension: Secondary | ICD-10-CM | POA: Diagnosis present

## 2023-09-08 HISTORY — PX: CAROTID PTA/STENT INTERVENTION: CATH118231

## 2023-09-08 LAB — MRSA NEXT GEN BY PCR, NASAL: MRSA by PCR Next Gen: NOT DETECTED

## 2023-09-08 LAB — GLUCOSE, CAPILLARY: Glucose-Capillary: 78 mg/dL (ref 70–99)

## 2023-09-08 SURGERY — CAROTID PTA/STENT INTERVENTION
Anesthesia: Moderate Sedation

## 2023-09-08 MED ORDER — ASPIRIN 81 MG PO TBEC
81.0000 mg | DELAYED_RELEASE_TABLET | Freq: Every day | ORAL | Status: DC
  Administered 2023-09-09: 81 mg via ORAL
  Filled 2023-09-08: qty 1

## 2023-09-08 MED ORDER — IODIXANOL 320 MG/ML IV SOLN
INTRAVENOUS | Status: DC | PRN
  Administered 2023-09-08: 80 mL

## 2023-09-08 MED ORDER — LISINOPRIL 10 MG PO TABS: 5.0000 mg | ORAL_TABLET | Freq: Every day | ORAL | Status: DC

## 2023-09-08 MED ORDER — FENTANYL CITRATE (PF) 100 MCG/2ML IJ SOLN
INTRAMUSCULAR | Status: DC | PRN
  Administered 2023-09-08: 50 ug via INTRAVENOUS

## 2023-09-08 MED ORDER — HEPARIN (PORCINE) IN NACL 1000-0.9 UT/500ML-% IV SOLN
INTRAVENOUS | Status: DC | PRN
  Administered 2023-09-08: 1000 mL

## 2023-09-08 MED ORDER — MIDAZOLAM HCL 2 MG/ML PO SYRP: 8.0000 mg | ORAL_SOLUTION | Freq: Once | ORAL | Status: DC | PRN

## 2023-09-08 MED ORDER — OXYCODONE-ACETAMINOPHEN 5-325 MG PO TABS
1.0000 | ORAL_TABLET | ORAL | Status: DC | PRN
  Administered 2023-09-08: 1 via ORAL
  Filled 2023-09-08: qty 1

## 2023-09-08 MED ORDER — PHENOL 1.4 % MT LIQD: 1.0000 | OROMUCOSAL | Status: DC | PRN

## 2023-09-08 MED ORDER — ALBUTEROL SULFATE (2.5 MG/3ML) 0.083% IN NEBU: 3.0000 mL | INHALATION_SOLUTION | Freq: Four times a day (QID) | RESPIRATORY_TRACT | Status: DC | PRN

## 2023-09-08 MED ORDER — HYDRALAZINE HCL 20 MG/ML IJ SOLN: 5.0000 mg | INTRAMUSCULAR | Status: DC | PRN

## 2023-09-08 MED ORDER — HEPARIN SODIUM (PORCINE) 1000 UNIT/ML IJ SOLN
INTRAMUSCULAR | Status: AC
  Filled 2023-09-08: qty 10

## 2023-09-08 MED ORDER — MORPHINE SULFATE (PF) 4 MG/ML IV SOLN
2.0000 mg | INTRAVENOUS | Status: DC | PRN
Start: 2023-09-08 — End: 2023-09-09

## 2023-09-08 MED ORDER — HYDROMORPHONE HCL 1 MG/ML IJ SOLN: 1.0000 mg | Freq: Once | INTRAMUSCULAR | Status: DC | PRN

## 2023-09-08 MED ORDER — ALUM & MAG HYDROXIDE-SIMETH 200-200-20 MG/5ML PO SUSP
15.0000 mL | ORAL | Status: DC | PRN
Start: 2023-09-08 — End: 2023-09-09

## 2023-09-08 MED ORDER — SODIUM CHLORIDE 0.9 % IV SOLN: INTRAVENOUS | Status: DC

## 2023-09-08 MED ORDER — ACETAMINOPHEN 325 MG RE SUPP: 325.0000 mg | RECTAL | Status: DC | PRN

## 2023-09-08 MED ORDER — ROSUVASTATIN CALCIUM 20 MG PO TABS
40.0000 mg | ORAL_TABLET | ORAL | Status: DC
Start: 2023-09-09 — End: 2023-09-09
  Administered 2023-09-09: 40 mg via ORAL
  Filled 2023-09-08: qty 2
  Filled 2023-09-08: qty 4

## 2023-09-08 MED ORDER — PHENYLEPHRINE HCL-NACL 20-0.9 MG/250ML-% IV SOLN
0.0000 ug/min | INTRAVENOUS | Status: DC
  Administered 2023-09-08: 20 ug/min via INTRAVENOUS
  Administered 2023-09-09: 40 ug/min via INTRAVENOUS
  Filled 2023-09-08: qty 250

## 2023-09-08 MED ORDER — MONTELUKAST SODIUM 10 MG PO TABS
10.0000 mg | ORAL_TABLET | Freq: Every day | ORAL | Status: DC
  Administered 2023-09-08: 10 mg via ORAL
  Filled 2023-09-08: qty 1

## 2023-09-08 MED ORDER — GUAIFENESIN-DM 100-10 MG/5ML PO SYRP: 15.0000 mL | ORAL_SOLUTION | ORAL | Status: DC | PRN

## 2023-09-08 MED ORDER — PHENYLEPHRINE 80 MCG/ML (10ML) SYRINGE FOR IV PUSH (FOR BLOOD PRESSURE SUPPORT)
PREFILLED_SYRINGE | INTRAVENOUS | Status: AC
  Filled 2023-09-08: qty 10

## 2023-09-08 MED ORDER — CLOPIDOGREL BISULFATE 75 MG PO TABS
75.0000 mg | ORAL_TABLET | Freq: Every day | ORAL | Status: DC
Start: 2023-09-09 — End: 2023-09-09
  Administered 2023-09-09: 75 mg via ORAL
  Filled 2023-09-08: qty 1

## 2023-09-08 MED ORDER — PHENYLEPHRINE HCL-NACL 20-0.9 MG/250ML-% IV SOLN
INTRAVENOUS | Status: AC
  Filled 2023-09-08: qty 250

## 2023-09-08 MED ORDER — ONDANSETRON HCL 4 MG/2ML IJ SOLN: 4.0000 mg | Freq: Four times a day (QID) | INTRAMUSCULAR | Status: DC | PRN

## 2023-09-08 MED ORDER — POTASSIUM CHLORIDE CRYS ER 20 MEQ PO TBCR: 20.0000 meq | EXTENDED_RELEASE_TABLET | Freq: Every day | ORAL | Status: DC | PRN

## 2023-09-08 MED ORDER — MIDAZOLAM HCL 2 MG/2ML IJ SOLN
INTRAMUSCULAR | Status: DC | PRN
Start: 2023-09-08 — End: 2023-09-08
  Administered 2023-09-08: 1 mg via INTRAVENOUS

## 2023-09-08 MED ORDER — SODIUM CHLORIDE 0.9 % IV BOLUS
INTRAVENOUS | Status: DC | PRN
  Administered 2023-09-08: 999 mL/h via INTRAVENOUS

## 2023-09-08 MED ORDER — VITAMIN B-12 1000 MCG PO TABS
2000.0000 ug | ORAL_TABLET | Freq: Every day | ORAL | Status: DC
  Administered 2023-09-09: 2000 ug via ORAL
  Filled 2023-09-08: qty 2

## 2023-09-08 MED ORDER — DIPHENHYDRAMINE HCL 50 MG/ML IJ SOLN: 50.0000 mg | Freq: Once | INTRAMUSCULAR | Status: DC | PRN

## 2023-09-08 MED ORDER — PANTOPRAZOLE SODIUM 40 MG PO TBEC
40.0000 mg | DELAYED_RELEASE_TABLET | Freq: Every day | ORAL | Status: DC
  Administered 2023-09-09: 40 mg via ORAL
  Filled 2023-09-08: qty 1

## 2023-09-08 MED ORDER — FAMOTIDINE 20 MG PO TABS: 40.0000 mg | ORAL_TABLET | Freq: Once | ORAL | Status: DC | PRN

## 2023-09-08 MED ORDER — PHENYLEPHRINE HCL-NACL 20-0.9 MG/250ML-% IV SOLN
INTRAVENOUS | Status: AC
Start: 2023-09-08 — End: ?
  Filled 2023-09-08: qty 250

## 2023-09-08 MED ORDER — ATROPINE SULFATE 1 MG/10ML IJ SOSY
PREFILLED_SYRINGE | INTRAMUSCULAR | Status: DC | PRN
Start: 2023-09-08 — End: 2023-09-08
  Administered 2023-09-08: 1 mg via INTRAVENOUS

## 2023-09-08 MED ORDER — CEFAZOLIN SODIUM-DEXTROSE 2-4 GM/100ML-% IV SOLN
2.0000 g | Freq: Three times a day (TID) | INTRAVENOUS | Status: AC
  Administered 2023-09-08 – 2023-09-09 (×2): 2 g via INTRAVENOUS
  Filled 2023-09-08 (×2): qty 100

## 2023-09-08 MED ORDER — CEFAZOLIN SODIUM-DEXTROSE 2-4 GM/100ML-% IV SOLN
INTRAVENOUS | Status: AC
  Filled 2023-09-08: qty 100

## 2023-09-08 MED ORDER — FENTANYL CITRATE (PF) 100 MCG/2ML IJ SOLN
INTRAMUSCULAR | Status: AC
  Filled 2023-09-08: qty 2

## 2023-09-08 MED ORDER — CHLORHEXIDINE GLUCONATE CLOTH 2 % EX PADS
6.0000 | MEDICATED_PAD | Freq: Every day | CUTANEOUS | Status: DC
Start: 2023-09-08 — End: 2023-09-09
  Administered 2023-09-08 – 2023-09-09 (×2): 6 via TOPICAL

## 2023-09-08 MED ORDER — ATROPINE SULFATE 1 MG/10ML IJ SOSY
PREFILLED_SYRINGE | INTRAMUSCULAR | Status: AC
  Filled 2023-09-08: qty 20

## 2023-09-08 MED ORDER — ORAL CARE MOUTH RINSE: 15.0000 mL | OROMUCOSAL | Status: DC | PRN

## 2023-09-08 MED ORDER — ONDANSETRON HCL 4 MG/2ML IJ SOLN
4.0000 mg | Freq: Four times a day (QID) | INTRAMUSCULAR | Status: DC | PRN
Start: 2023-09-08 — End: 2023-09-09

## 2023-09-08 MED ORDER — LABETALOL HCL 5 MG/ML IV SOLN: 10.0000 mg | INTRAVENOUS | Status: DC | PRN

## 2023-09-08 MED ORDER — METHYLPREDNISOLONE SODIUM SUCC 125 MG IJ SOLR: 125.0000 mg | Freq: Once | INTRAMUSCULAR | Status: DC | PRN

## 2023-09-08 MED ORDER — CEFAZOLIN SODIUM-DEXTROSE 2-4 GM/100ML-% IV SOLN
2.0000 g | INTRAVENOUS | Status: AC
  Administered 2023-09-08: 2 g via INTRAVENOUS

## 2023-09-08 MED ORDER — OMEGA-3-ACID ETHYL ESTERS 1 G PO CAPS
1.0000 g | ORAL_CAPSULE | Freq: Every day | ORAL | Status: DC
  Administered 2023-09-09: 1 g via ORAL
  Filled 2023-09-08: qty 1

## 2023-09-08 MED ORDER — LIDOCAINE-EPINEPHRINE (PF) 1 %-1:200000 IJ SOLN
INTRAMUSCULAR | Status: DC | PRN
  Administered 2023-09-08: 10 mL

## 2023-09-08 MED ORDER — FAMOTIDINE IN NACL 20-0.9 MG/50ML-% IV SOLN
20.0000 mg | Freq: Two times a day (BID) | INTRAVENOUS | Status: DC
  Administered 2023-09-08: 20 mg via INTRAVENOUS
  Filled 2023-09-08: qty 50

## 2023-09-08 MED ORDER — ACETAMINOPHEN 325 MG PO TABS: 325.0000 mg | ORAL_TABLET | ORAL | Status: DC | PRN

## 2023-09-08 MED ORDER — MIDAZOLAM HCL 2 MG/2ML IJ SOLN
INTRAMUSCULAR | Status: AC
  Filled 2023-09-08: qty 4

## 2023-09-08 MED ORDER — MAGNESIUM SULFATE 2 GM/50ML IV SOLN: 2.0000 g | Freq: Every day | INTRAVENOUS | Status: DC | PRN

## 2023-09-08 MED ORDER — VITAMIN D 25 MCG (1000 UNIT) PO TABS
2000.0000 [IU] | ORAL_TABLET | Freq: Every day | ORAL | Status: DC
Start: 2023-09-09 — End: 2023-09-09
  Administered 2023-09-09: 2000 [IU] via ORAL
  Filled 2023-09-08: qty 2

## 2023-09-08 MED ORDER — SODIUM CHLORIDE 0.9 % IV SOLN
500.0000 mL | Freq: Once | INTRAVENOUS | Status: AC | PRN
  Administered 2023-09-08: 500 mL via INTRAVENOUS

## 2023-09-08 MED ORDER — HEPARIN SODIUM (PORCINE) 1000 UNIT/ML IJ SOLN
INTRAMUSCULAR | Status: DC | PRN
  Administered 2023-09-08: 8000 [IU] via INTRAVENOUS

## 2023-09-08 MED ORDER — ADULT MULTIVITAMIN W/MINERALS CH
1.0000 | ORAL_TABLET | Freq: Every day | ORAL | Status: DC
  Administered 2023-09-09: 1 via ORAL
  Filled 2023-09-08: qty 1

## 2023-09-08 SURGICAL SUPPLY — 20 items
BALLOON VTRAC 4.5X30X135 (BALLOONS) IMPLANT
CATH ANGIO 5F 100CM .035 PIG (CATHETERS) IMPLANT
CATH BEACON 5 .038 100 VERT TP (CATHETERS) IMPLANT
CATH HEADHUNTER H1 5F 100CM (CATHETERS) IMPLANT
COVER PROBE ULTRASOUND 5X96 (MISCELLANEOUS) IMPLANT
DEVICE EMBOSHIELD NAV6 4.0-7.0 (FILTER) IMPLANT
DEVICE PRESTO INFLATION (MISCELLANEOUS) IMPLANT
DEVICE STARCLOSE SE CLOSURE (Vascular Products) IMPLANT
GLIDEWIRE ANGLED SS 035X260CM (WIRE) IMPLANT
GUIDEWIRE PFTE-COATED .018X300 (WIRE) IMPLANT
KIT CAROTID MANIFOLD (MISCELLANEOUS) IMPLANT
PACK ANGIOGRAPHY (CUSTOM PROCEDURE TRAY) ×1 IMPLANT
SHEATH BRITE TIP 6FRX11 (SHEATH) IMPLANT
SHEATH SHUTTLE 6FRX80 (SHEATH) IMPLANT
STENT XACT CAR 9-7X40X136 (Permanent Stent) IMPLANT
SUT MNCRL AB 4-0 PS2 18 (SUTURE) IMPLANT
SYR MEDRAD MARK 7 150ML (SYRINGE) IMPLANT
TUBING CONTRAST HIGH PRESS 72 (TUBING) IMPLANT
WIRE G VAS 035X260 STIFF (WIRE) IMPLANT
WIRE J 3MM .035X145CM (WIRE) IMPLANT

## 2023-09-08 NOTE — Interval H&P Note (Signed)
 History and Physical Interval Note:  09/08/2023 9:48 AM  Juan Salas  has presented today for surgery, with the diagnosis of L Carotid Stent    ABBOTT    Carotid artery stenosis.  The various methods of treatment have been discussed with the patient and family. After consideration of risks, benefits and other options for treatment, the patient has consented to  Procedure(s): CAROTID PTA/STENT INTERVENTION (N/A) as a surgical intervention.  The patient's history has been reviewed, patient examined, no change in status, stable for surgery.  I have reviewed the patient's chart and labs.  Questions were answered to the patient's satisfaction.     Atianna Haidar

## 2023-09-08 NOTE — Plan of Care (Signed)
  Problem: Education: Goal: Knowledge of discharge needs will improve Outcome: Progressing   Problem: Clinical Measurements: Goal: Postoperative complications will be avoided or minimized Outcome: Progressing   Problem: Respiratory: Goal: Will achieve and/or maintain a regular respiratory rate, without signs or symptoms of dyspnea Outcome: Progressing   Problem: Skin Integrity: Goal: Demonstration of wound healing without infection will improve Outcome: Progressing   Problem: Education: Goal: Knowledge of General Education information will improve Description: Including pain rating scale, medication(s)/side effects and non-pharmacologic comfort measures Outcome: Progressing   Problem: Health Behavior/Discharge Planning: Goal: Ability to manage health-related needs will improve Outcome: Progressing

## 2023-09-08 NOTE — Op Note (Signed)
 OPERATIVE NOTE DATE: 09/08/2023  PROCEDURE:  Ultrasound guidance for vascular access right femoral artery  Placement of a 9 mm proximal, 7 mm distal, 4 cm long exact stent with the use of the NAV-6 embolic protection device in the left carotid artery  PRE-OPERATIVE DIAGNOSIS: 1.  High-grade left carotid artery stenosis.   POST-OPERATIVE DIAGNOSIS:  Same as above  SURGEON: Mikki Alexander, MD  ASSISTANT(S): None  ANESTHESIA: local/MCS  ESTIMATED BLOOD LOSS: 75 cc  CONTRAST: 80 cc  FLUORO TIME: 10.9 minutes  MODERATE CONSCIOUS SEDATION TIME:  Approximately 61 minutes using 1 mg of Versed  and 50 mcg of Fentanyl   FINDING(S): 1.   98% left carotid artery stenosis  SPECIMEN(S):   none  INDICATIONS:   Patient is a 63 y.o. male who presents with nearly occlusive, high-grade left carotid artery stenosis.  The patient has good anatomy for carotid stenting and carotid artery stenting was felt to be preferred to endarterectomy for that reason. I have completed Share Decision Making with Hubbert L Idrovo prior to surgery.  Conversations included: -Discussion of all treatment options including carotid endarterectomy (CEA), CAS (which includes transcarotid artery revascularization (TCAR)), and optimal medical therapy (OMT)). -Explanation of risks and benefits for each option specific to Dabney L Hult's clinical situation. -Integration of clinical guidelines as it relates to the patient's history and co-morbidities -Discussion and incorporation of Zayed L Mckenny and their personal preferences and priorities in choosing a treatment plan.  If patient was unable to participate in Shared Decision Making this process was done with the patient and his wife  Risks and benefits were discussed and informed consent was obtained.   DESCRIPTION: After obtaining full informed written consent, the patient was brought back to the vascular suite and placed supine upon the table.  The patient  received IV antibiotics prior to induction. Moderate conscious sedation was administered during a face to face encounter with the patient throughout the procedure with my supervision of the RN administering medicines and monitoring the patients vital signs and mental status throughout from the start of the procedure until the patient was taken to the recovery room.  After obtaining adequate anesthesia, the patient was prepped and draped in the standard fashion.   The right femoral artery was visualized with ultrasound and found to be widely patent. It was then accessed under direct ultrasound guidance without difficulty with a Seldinger needle. A permanent image was recorded. A J-wire was placed and we then placed a 6 French sheath. The patient was then heparinized and a total of 8000 units of intravenous heparin were given and an ACT was checked to confirm successful anticoagulation. A pigtail catheter was then placed into the ascending aorta. This showed a type II aortic arch.  There also appeared to be some stenosis at the origin of the left subclavian artery of greater than 60%. I then selectively cannulated the left common carotid artery without difficulty with a headhunter catheter and advanced into the mid left common carotid artery.  Cervical and cerebral carotid angiography was then performed. There were no obvious intracranial filling defects with no anterior cerebral filling and sluggish middle cerebral filling due to the high-grade carotid bifurcation lesion. The carotid bifurcation demonstrated a roughly 98% stenosis at the origin of the left internal carotid artery.  I then advanced into the external carotid artery with a Glidewire and the headhunter catheter and then exchanged for the Amplatz Super Stiff wire. Over the Amplatz Super Stiff wire, a 6 [place] shuttle sheath  was placed into the mid common carotid artery. I then used the NAV-6  Embolic protection device and crossed the lesion and parked  this in the distal internal carotid artery at the base of the skull.  This was difficult due to the very tight lesion.  A buddy wire system was used with a 0.018 advantage wire and initially crossing the lesion to straighten it out and then I was able to easily cross the embolic protection device over its wire.  I then selected a 9 mm proximal, 7 mm distal 4 cm long exact stent. This was deployed across the lesion encompassing it in its entirety. A 4.5 mm x 3 cm length balloon was used to post dilate the stent. Only about a 15% residual stenosis was present after angioplasty. Completion angiogram showed normal intracranial filling without new defects. At this point I elected to terminate the procedure. The sheath was removed and StarClose closure device was deployed in the right femoral artery with excellent hemostatic result. The patient was taken to the recovery room in stable condition having tolerated the procedure well.  COMPLICATIONS: none  CONDITION: stable  Mikki Alexander 09/08/2023 3:02 PM   This note was created with Dragon Medical transcription system. Any errors in dictation are purely unintentional.

## 2023-09-09 ENCOUNTER — Encounter: Payer: Self-pay | Admitting: Vascular Surgery

## 2023-09-09 DIAGNOSIS — I6522 Occlusion and stenosis of left carotid artery: Secondary | ICD-10-CM

## 2023-09-09 LAB — CBC
HCT: 31.9 % — ABNORMAL LOW (ref 39.0–52.0)
Hemoglobin: 10.6 g/dL — ABNORMAL LOW (ref 13.0–17.0)
MCH: 31.6 pg (ref 26.0–34.0)
MCHC: 33.2 g/dL (ref 30.0–36.0)
MCV: 95.2 fL (ref 80.0–100.0)
Platelets: 229 10*3/uL (ref 150–400)
RBC: 3.35 MIL/uL — ABNORMAL LOW (ref 4.22–5.81)
RDW: 12.8 % (ref 11.5–15.5)
WBC: 6.3 10*3/uL (ref 4.0–10.5)
nRBC: 0 % (ref 0.0–0.2)

## 2023-09-09 LAB — BASIC METABOLIC PANEL WITH GFR
Anion gap: 4 — ABNORMAL LOW (ref 5–15)
BUN: 18 mg/dL (ref 8–23)
CO2: 24 mmol/L (ref 22–32)
Calcium: 8.2 mg/dL — ABNORMAL LOW (ref 8.9–10.3)
Chloride: 109 mmol/L (ref 98–111)
Creatinine, Ser: 1.08 mg/dL (ref 0.61–1.24)
GFR, Estimated: >60 mL/min (ref >60)
Glucose, Bld: 102 mg/dL — ABNORMAL HIGH (ref 70–99)
Potassium: 4.2 mmol/L (ref 3.5–5.1)
Sodium: 137 mmol/L (ref 135–145)

## 2023-09-09 LAB — POCT ACTIVATED CLOTTING TIME: Activated Clotting Time: 308 s

## 2023-09-09 NOTE — Progress Notes (Signed)
 Pt to be discharged to home with wife at bedside. All discharge instructions discussed with pt, and pt's wife. Pt aware of upcoming appointments. All questions answered at this time.

## 2023-09-09 NOTE — TOC CM/SW Note (Signed)
 Transition of Care [facility]) - Inpatient Brief Assessment   Patient Details  Name: Juan Salas MRN: [id number] Date of Birth: May 28, 1960  Transition of Care [facility]) CM/SW Contact:    Haislee Corso A Chasitie Passey, RN Phone Number: 09/09/2023, 11:44 AM   Clinical Narrative: Chart reviewed.  Noted that patient was admitted with Carotid Stenosis.  Patient is postop day number one from endovascular left carotid stent placement.  Patient has ambulated around the unit several times without any assistive devices.    No TOC needs.   Transition of Care Asessment: Insurance and Status: Insurance coverage has been reviewed Patient has primary care physician: Yes Home environment has been reviewed: Reviewed Prior level of function:: Independent Prior/Current Home Services: No current home services Social Drivers of Health Review: SDOH reviewed no interventions necessary Readmission risk has been reviewed: Yes Transition of care needs: no transition of care needs at this time

## 2023-09-09 NOTE — Progress Notes (Signed)
  Progress Note    09/09/2023 7:57 AM 1 Day Post-Op  Subjective: Juan Salas 63 year old male who is now status postop day 1 from endovascular left carotid stent placement.  Patient is resting comfortably in bed this morning.  He still remains on phenylephrine for blood pressure support.  His heart rate was 55 but the patient endorses that his normal he works out a lot.  No other complaints overnight.  Vitals all remained stable.   Vitals:   09/09/23 0600 09/09/23 0610  BP: (!) 144/130 (!) 144/130  Pulse: (!) 43 (!) 48  Resp: 16 15  Temp:  97.8 F (36.6 C)  SpO2: 100% 100%   Physical Exam: Cardiac:  RRR normal S1-S2 with bradycardia this morning.  No rubs clicks gallops or murmurs noted. Lungs: Lungs are clear on auscultation throughout, no rales rhonchi wheezing noted. Incisions: Right femoral artery access for procedure.  Dressings clean dry and intact no hematoma seroma to note. Extremities: All extremities with palpable pulses and warm to touch. Abdomen: Positive bowel sounds throughout on auscultation, soft, nontender nondistended. Neurologic: Alert and oriented x 4, answers all questions and follows commands appropriately.  Completely neuro intact this morning.  CBC    Component Value Date/Time   WBC 6.3 09/09/2023 0452   RBC 3.35 (L) 09/09/2023 0452   HGB 10.6 (L) 09/09/2023 0452   HGB 13.4 02/22/2020 1018   HCT 31.9 (L) 09/09/2023 0452   HCT 38.8 02/22/2020 1018   PLT 229 09/09/2023 0452   PLT 256 02/22/2020 1018   MCV 95.2 09/09/2023 0452   MCV 93 02/22/2020 1018   MCH 31.6 09/09/2023 0452   MCHC 33.2 09/09/2023 0452   RDW 12.8 09/09/2023 0452   RDW 13.1 02/22/2020 1018   LYMPHSABS 1.5 02/22/2020 1018   MONOABS 0.8 02/05/2016 0827   EOSABS 0.1 02/22/2020 1018   BASOSABS 0.0 02/22/2020 1018    BMET    Component Value Date/Time   NA 137 09/09/2023 0452   NA 138 08/29/2023 1035   K 4.2 09/09/2023 0452   CL 109 09/09/2023 0452   CO2 24 09/09/2023  0452   GLUCOSE 102 (H) 09/09/2023 0452   BUN 18 09/09/2023 0452   BUN 16 08/29/2023 1035   CREATININE 1.08 09/09/2023 0452   CALCIUM  8.2 (L) 09/09/2023 0452   GFRNONAA >60 09/09/2023 0452   GFRAA 86 02/22/2020 1018    INR No results found for: "INR"   Intake/Output Summary (Last 24 hours) at 09/09/2023 0757 Last data filed at 09/09/2023 0610 Gross per 24 hour  Intake 2667.05 ml  Output 400 ml  Net 2267.05 ml     Assessment/Plan:  63 y.o. male is s/p left carotid endovascular stent placement.  1 Day Post-Op   PLAN Advance diet as tolerated. Pain medicine as needed. Wean off phenylephrine. Ambulate around the unit. Possible discharge home later today.  DVT prophylaxis: ASA 81 mg daily, Plavix  75 mg daily and Crestor  40 mg daily.   Annamaria Barrette [facility] 09/09/2023 7:57 AM

## 2023-09-09 NOTE — Discharge Instructions (Addendum)
 Do not lift anything heavy for the next 2 weeks.  Do not lift anything heavier than a gallon of milk.  Do not drive for 1 week.  You may shower tomorrow after you get home on 09/10/2023.  Shower with the old dressing in place.  After showering remove the old dressing pat completely dry and cover the incision area with a Band-Aid for the next 3 days.  You are being discharged on aspirin 81 mg daily, Plavix  75 mg daily and Crestor  40 mg daily.  Please do not skip or miss taking any of these medications as it will interfere with the outcome of your procedure.  Do not take any blood pressure medications today when you get home.  You may restart your home blood pressure medications on 09/10/2023.  Follow-up with vein and vascular surgery in clinic as scheduled in 4 weeks.

## 2023-09-09 NOTE — Plan of Care (Signed)
  Problem: Education: Goal: Knowledge of General Education information will improve Description: Including pain rating scale, medication(s)/side effects and non-pharmacologic comfort measures Outcome: Progressing   Problem: Clinical Measurements: Goal: Ability to maintain clinical measurements within normal limits will improve Outcome: Progressing Goal: Cardiovascular complication will be avoided Outcome: Progressing   Problem: Activity: Goal: Risk for activity intolerance will decrease Outcome: Progressing   Problem: Coping: Goal: Level of anxiety will decrease Outcome: Progressing   

## 2023-09-09 NOTE — Discharge Summary (Addendum)
 [facility]    Discharge Summary    Patient ID:  Juan Salas MRN: [id number] DOB/AGE: 1960-07-18 63 y.o.  Admit date: 09/08/2023 Discharge date: 09/09/2023 Date of Surgery: 09/08/2023 Surgeon: Surgeon(s): Celso College, MD  Admission Diagnosis: Carotid stenosis, left [I65.22]  Discharge Diagnoses:  Carotid stenosis, left [I65.22]  Secondary Diagnoses: Past Medical History:  Diagnosis Date   Fibromyalgia    GAD (generalized anxiety disorder)    GERD without esophagitis 05/26/2023   History of diverticulitis 05/26/2023   History of kidney stones 1977, 1983,2000   passed on his own. 7 different cycles of stones during youth.   Hyperlipidemia    Left carotid artery stenosis 05/26/2023   Migraine headache    Nocturia associated with benign prostatic hyperplasia 12/2017   Spinal stenosis     Procedure(s): CAROTID PTA/STENT INTERVENTION  Discharged Condition: good  HPI:  Juan Salas is a 64 year old male who is now postop day 1 from left endovascular carotid stent placement.  He required blood pressure support immediately postprocedure.  This has been stopped at 845 this morning.  Patient is recovering as expected.  Patient be discharged home later this evening.  Patient to be discharged on aspirin 81 mg daily, Plavix  75 mg daily, and Crestor  40 mg daily.  Patient was counseled to not skip or miss any of these medications as it will affect the outcome of his procedure.  I spent greater than 60 minutes doing the discharge instructions and teaching with this patient.  Hospital Course:  Juan Salas is a 63 y.o. male is S/P Left Endovascular Carotid Stent Placement.  Patient required blood pressure support postprocedure.  That was stopped at 845 this morning.  Patient's heart rate and blood pressure have been stable.  Patient is recovering as expected.  No complications to the incision site.  Patient to be discharged later  today. Extubated: POD # 0 Physical Exam:  Alert notes x3, no acute distress Face: Symmetrical.  Tongue is midline. Neck: Trachea is midline.  No swelling or bruising. Cardiovascular: Regular rate and rhythm Pulmonary: Clear to auscultation bilaterally Abdomen: Soft, nontender, nondistended Right groin access: Clean dry and intact.  No swelling or drainage noted Left lower extremity: Thigh soft.  Calf soft.  Extremities warm distally toes. Hard to palpate pedal pulses however the foot is warm is her good capillary refill. Right lower extremity: Thigh soft.  Calf soft.  Extremities warm distally toes. Hard to palpate pedal pulses however the foot is warm is her good capillary refill. Neurological: No deficits noted   Post-op wounds:  clean, dry, intact or healing well  Pt. Ambulating, voiding and taking PO diet without difficulty. Pt pain controlled with PO pain meds.  Labs:  As below  Complications: none  Consults:    Significant Diagnostic Studies: CBC Lab Results  Component Value Date   WBC 6.3 09/09/2023   HGB 10.6 (L) 09/09/2023   HCT 31.9 (L) 09/09/2023   MCV 95.2 09/09/2023   PLT 229 09/09/2023    BMET    Component Value Date/Time   NA 137 09/09/2023 0452   NA 138 08/29/2023 1035   K 4.2 09/09/2023 0452   CL 109 09/09/2023 0452   CO2 24 09/09/2023 0452   GLUCOSE 102 (H) 09/09/2023 0452   BUN 18 09/09/2023 0452   BUN 16 08/29/2023 1035   CREATININE 1.08 09/09/2023 0452   CALCIUM  8.2 (L) 09/09/2023 0452   GFRNONAA >60 09/09/2023 0452   GFRAA 86  02/22/2020 1018   COAG No results found for: "INR", "PROTIME"   Disposition:  Discharge to :Home  Allergies as of 09/09/2023       Reactions   Atorvastatin  Other (See Comments)   Joint pain Joint pain   Prednisone Other (See Comments)   After 3 days agitation and sleeplessness After 3 days agitation and sleeplessness   Verapamil  Nausea Only   Other reaction(s): Headache insomnia insomnia    Metoprolol  Palpitations   Per patient, can't tolerate. Per patient, can't tolerate.   Seasonal Ic [cholestatin] Other (See Comments)   Sinus issues   Sulfa  Antibiotics Rash        Medication List     TAKE these medications    acetaminophen  500 MG tablet Commonly known as: TYLENOL  Take 500 mg by mouth every 4 (four) hours as needed for moderate pain or headache.   albuterol  108 (90 Base) MCG/ACT inhaler Commonly known as: VENTOLIN  HFA Inhale 2 puffs into the lungs every 6 (six) hours as needed for wheezing or shortness of breath.   aspirin EC 81 MG tablet Take 81 mg by mouth daily. Swallow whole.   calcium  carbonate 500 MG chewable tablet Commonly known as: TUMS - dosed in mg elemental calcium  Chew 1 tablet by mouth 2 (two) times daily as needed for indigestion or heartburn.   clopidogrel  75 MG tablet Commonly known as: PLAVIX  Take 1 tablet (75 mg total) by mouth daily.   cyanocobalamin 2000 MCG tablet Take 2,000 mcg by mouth daily.   Fish Oil 1000 MG Caps Take 1,000 mg by mouth daily.   L-THEANINE PO Take 200 mg by mouth daily as needed (stress).   lisinopril  5 MG tablet Commonly known as: ZESTRIL  Take 5 mg by mouth at bedtime.   Magnesium 250 MG Tabs Take 250 mg by mouth daily as needed.   metoprolol  tartrate 100 MG tablet Commonly known as: LOPRESSOR  TAKE 1 TABLET 2 HR PRIOR TO CARDIAC PROCEDURE   montelukast  10 MG tablet Commonly known as: SINGULAIR  Take 1 tablet (10 mg total) by mouth at bedtime.   multivitamin with minerals Tabs tablet Take 1 tablet by mouth daily.   naproxen 250 MG tablet Commonly known as: NAPROSYN Take 250 mg by mouth as needed.   pantoprazole 40 MG tablet Commonly known as: PROTONIX Take 40 mg by mouth daily.   rosuvastatin  40 MG tablet Commonly known as: CRESTOR  Take 1 tablet (40 mg total) by mouth every morning.   tadalafil 20 MG tablet Commonly known as: CIALIS Take 20 mg by mouth daily as needed.   Vitamin D3  50 MCG (2000 UT) Tabs Take 1 tablet by mouth daily at 6 (six) AM.       Verbal and written Discharge instructions given to the patient. Wound care per Discharge AVS  Follow-up Information     Dew, Donald Frost, MD Follow up in 4 week(s).   Specialties: Vascular Surgery, Radiology, Interventional Cardiology Why: Carotid Ultrasounds Contact information: [address] [place] [address] [phone]                 Signed: Annamaria Barrette, NP  09/09/2023, 12:22 PM

## 2023-09-13 ENCOUNTER — Encounter (INDEPENDENT_AMBULATORY_CARE_PROVIDER_SITE_OTHER): Payer: Self-pay | Admitting: Vascular Surgery

## 2023-09-13 DIAGNOSIS — I6522 Occlusion and stenosis of left carotid artery: Secondary | ICD-10-CM

## 2023-09-15 ENCOUNTER — Ambulatory Visit: Admission: RE | Admit: 2023-09-15 | Source: Ambulatory Visit

## 2023-09-17 ENCOUNTER — Emergency Department

## 2023-09-17 ENCOUNTER — Other Ambulatory Visit: Payer: Self-pay

## 2023-09-17 ENCOUNTER — Observation Stay
Admission: EM | Admit: 2023-09-17 | Discharge: 2023-09-19 | Disposition: A | Discharge status: Home / Self Care | Attending: Internal Medicine | Admitting: Internal Medicine

## 2023-09-17 DIAGNOSIS — Z79899 Other long term (current) drug therapy: Secondary | ICD-10-CM | POA: Insufficient documentation

## 2023-09-17 DIAGNOSIS — E7889 Other lipoprotein metabolism disorders: Secondary | ICD-10-CM | POA: Insufficient documentation

## 2023-09-17 DIAGNOSIS — I214 Non-ST elevation (NSTEMI) myocardial infarction: Secondary | ICD-10-CM

## 2023-09-17 DIAGNOSIS — J4520 Mild intermittent asthma, uncomplicated: Secondary | ICD-10-CM | POA: Diagnosis not present

## 2023-09-17 DIAGNOSIS — I10 Essential (primary) hypertension: Secondary | ICD-10-CM | POA: Diagnosis not present

## 2023-09-17 DIAGNOSIS — Z7902 Long term (current) use of antithrombotics/antiplatelets: Secondary | ICD-10-CM | POA: Diagnosis not present

## 2023-09-17 DIAGNOSIS — M542 Cervicalgia: Secondary | ICD-10-CM | POA: Diagnosis present

## 2023-09-17 DIAGNOSIS — I160 Hypertensive urgency: Secondary | ICD-10-CM | POA: Diagnosis not present

## 2023-09-17 LAB — CBC WITH DIFFERENTIAL/PLATELET
Abs Immature Granulocytes: 0.03 10*3/uL (ref 0.00–0.07)
Basophils Absolute: 0 10*3/uL (ref 0.0–0.1)
Basophils Relative: 0 %
Eosinophils Absolute: 0.1 10*3/uL (ref 0.0–0.5)
Eosinophils Relative: 2 %
HCT: 36.7 % — ABNORMAL LOW (ref 39.0–52.0)
Hemoglobin: 12.4 g/dL — ABNORMAL LOW (ref 13.0–17.0)
Immature Granulocytes: 1 %
Lymphocytes Relative: 37 %
Lymphs Abs: 2.3 10*3/uL (ref 0.7–4.0)
MCH: 30.9 pg (ref 26.0–34.0)
MCHC: 33.8 g/dL (ref 30.0–36.0)
MCV: 91.5 fL (ref 80.0–100.0)
Monocytes Absolute: 0.4 10*3/uL (ref 0.1–1.0)
Monocytes Relative: 7 %
Neutro Abs: 3.3 10*3/uL (ref 1.7–7.7)
Neutrophils Relative %: 53 %
Platelets: 267 10*3/uL (ref 150–400)
RBC: 4.01 MIL/uL — ABNORMAL LOW (ref 4.22–5.81)
RDW: 12.7 % (ref 11.5–15.5)
WBC: 6.1 10*3/uL (ref 4.0–10.5)
nRBC: 0 % (ref 0.0–0.2)

## 2023-09-17 LAB — BASIC METABOLIC PANEL WITH GFR
Anion gap: 11 (ref 5–15)
BUN: 16 mg/dL (ref 8–23)
CO2: 22 mmol/L (ref 22–32)
Calcium: 9.5 mg/dL (ref 8.9–10.3)
Chloride: 101 mmol/L (ref 98–111)
Creatinine, Ser: 0.99 mg/dL (ref 0.61–1.24)
GFR, Estimated: >60 mL/min (ref >60)
Glucose, Bld: 121 mg/dL — ABNORMAL HIGH (ref 70–99)
Potassium: 3.4 mmol/L — ABNORMAL LOW (ref 3.5–5.1)
Sodium: 134 mmol/L — ABNORMAL LOW (ref 135–145)

## 2023-09-17 LAB — TROPONIN I (HIGH SENSITIVITY): Troponin I (High Sensitivity): 34 ng/L — ABNORMAL HIGH (ref <18)

## 2023-09-17 MED ORDER — HYDRALAZINE HCL 20 MG/ML IJ SOLN
5.0000 mg | Freq: Once | INTRAMUSCULAR | Status: DC
  Filled 2023-09-17: qty 1

## 2023-09-17 MED ORDER — IOHEXOL 350 MG/ML SOLN
75.0000 mL | Freq: Once | INTRAVENOUS | Status: AC | PRN
  Administered 2023-09-17: 75 mL via INTRAVENOUS

## 2023-09-17 NOTE — ED Notes (Signed)
 Pt is up out of bed urinate without difficulty.

## 2023-09-17 NOTE — ED Provider Notes (Signed)
 [facility] Provider Note    Event Date/Time   First MD Initiated Contact with Patient 09/17/23 2032     (approximate)   History   Hypertension   HPI  Juan Salas is a 63 y.o. male who presents to the emergency department today because of concerns for high blood pressure and left neck pain.  Patient had left carotid artery stent placed 9 days ago.  Today the patient started feeling some pain to the left side of his neck.  He took his blood pressure and it was in the 220s.  He then took one of his blood pressure medications.  He does state he has not been taking them recently because after the stent was placed his blood pressure was low.  The time my exam he still has some throbbing sensation to his neck. Did have some slight headache as well when the blood pressure was very high. Has been taking his medication as prescribed.     Physical Exam   Triage Vital Signs: ED Triage Vitals [09/17/23 2033]  Encounter Vitals Group     BP (!) 177/100     Systolic BP Percentile      Diastolic BP Percentile      Pulse Rate 94     Resp 20     Temp 97.7 F (36.5 C)     Temp Source Oral     SpO2 100 %     Weight      Height      Head Circumference      Peak Flow      Pain Score      Pain Loc      Pain Education      Exclude from Growth Chart     Most recent vital signs: Vitals:   09/17/23 2230 09/17/23 2330  BP: 132/89 120/83  Pulse: 80 76  Resp: 17 12  Temp:    SpO2: 96% 97%   General: Awake, alert, oriented. CV:  Good peripheral perfusion. Regular rate and rhythm. Resp:  Normal effort. Lungs clear. Abd:  No distention.    ED Results / Procedures / Treatments   Labs (all labs ordered are listed, but only abnormal results are displayed) Labs Reviewed  CBC WITH DIFFERENTIAL/PLATELET - Abnormal; Notable for the following components:      Result Value   RBC 4.01 (*)    Hemoglobin 12.4 (*)    HCT 36.7 (*)    All other components within  normal limits  BASIC METABOLIC PANEL WITH GFR - Abnormal; Notable for the following components:   Sodium 134 (*)    Potassium 3.4 (*)    Glucose, Bld 121 (*)    All other components within normal limits  TROPONIN I (HIGH SENSITIVITY) - Abnormal; Notable for the following components:   Troponin I (High Sensitivity) 34 (*)    All other components within normal limits  TROPONIN I (HIGH SENSITIVITY)     EKG  I, Marylynn Soho, attending physician, personally viewed and interpreted this EKG  EKG Time: 2030 Rate: 95 Rhythm: sinus rhythm Axis: left axis deviation Intervals: qtc 495 QRS: narrow, q waves v1, v2 ST changes: no st elevation Impression: abnormal ekg   RADIOLOGY I independently interpreted and visualized the CT angio head/neck. My interpretation: patent flow through stent Radiology interpretation:  IMPRESSION:  CT HEAD:    Negative head CT.  No acute intracranial abnormality.    CTA HEAD AND NECK:    1.  Negative CTA for large vessel occlusion or other emergent  finding.  2. Vascular stent traverses the left carotid bulb. Patent flow  through the stent. Focal narrowing at the mid aspect of the stent  with up to 50% stenosis by NASCET criteria.  3. Moderate stenosis at the origin of the left vertebral artery.  4.  Aortic Atherosclerosis (ICD10-I70.0).     PROCEDURES:  Critical Care performed: No   MEDICATIONS ORDERED IN ED: Medications  iohexol  (OMNIPAQUE ) 350 MG/ML injection 75 mL (75 mLs Intravenous Contrast Given 09/17/23 2209)     IMPRESSION / MDM / ASSESSMENT AND PLAN / ED COURSE  I reviewed the triage vital signs and the nursing notes.                              Differential diagnosis includes, but is not limited to, arterial dissection, stenosis, ACS  Patient's presentation is most consistent with acute presentation with potential threat to life or bodily function.  Patient presents to the emergency department today because of concern for  neck pain and hypertension, recent carotid stenosis. While here in the emergency department today patients blood pressure did resolve without any intervention. Did obtain a CTA head/neck which did not show any dissection, although showed 50 percent stenosis of the stent. Discussed with Dr. Farrel Hones with vascular surgery. Nothing to do at this time. Recommended outpatient follow up. Discussed with patient. Additionally troponin was slightly elevated, which patient has had in the past. Will check second troponin. If stable do think patient would be safe to discharge to follow up with Dr. Vonna Guardian.       FINAL CLINICAL IMPRESSION(S) / ED DIAGNOSES   Final diagnoses:  Hypertension, unspecified type  Neck pain        Rx / DC Orders     Note:  This document was prepared using Dragon voice recognition software and may include unintentional dictation errors.    Marylynn Soho, MD 09/18/23 [phone]

## 2023-09-17 NOTE — ED Triage Notes (Addendum)
 Pt reports while at home he started having a throbbing sensation to the left side of his neck, feeling off balance and after taking his bp which was 220's/110's at approx 1630 today, he told his family to call EMS. Pt has a left stent placed in his left carotid. Pt sts he did not feel like he was going to pass out or any issues with his vision. Pt does sts he started having sudden headache on the left side. Pt denies weakness.

## 2023-09-17 NOTE — ED Provider Notes (Incomplete)
 [facility] Provider Note    Event Date/Time   First MD Initiated Contact with Patient 09/17/23 2032     (approximate)   History   Hypertension   HPI  Juan Salas is a 63 y.o. male who presents to the emergency department today because of concerns for high blood pressure and left neck pain.  Patient had left carotid artery stent placed 9 days ago.  Today the patient started feeling some pain to the left side of his neck.  He took his blood pressure and it was in the 220s.  He then took one of his blood pressure medications.  He does state he has not been taking them recently because after the stent was placed his blood pressure was low.  The time my exam he still has some throbbing sensation to his neck. Did have some slight headache as well when the blood pressure was very high. Has been taking his medication as prescribed.     Physical Exam   Triage Vital Signs: ED Triage Vitals [09/17/23 2033]  Encounter Vitals Group     BP (!) 177/100     Systolic BP Percentile      Diastolic BP Percentile      Pulse Rate 94     Resp 20     Temp 97.7 F (36.5 C)     Temp Source Oral     SpO2 100 %     Weight      Height      Head Circumference      Peak Flow      Pain Score      Pain Loc      Pain Education      Exclude from Growth Chart     Most recent vital signs: Vitals:   09/17/23 2230 09/17/23 2330  BP: 132/89 120/83  Pulse: 80 76  Resp: 17 12  Temp:    SpO2: 96% 97%   General: Awake, alert, oriented. CV:  Good peripheral perfusion. Regular rate and rhythm. Resp:  Normal effort. Lungs clear. Abd:  No distention.    ED Results / Procedures / Treatments   Labs (all labs ordered are listed, but only abnormal results are displayed) Labs Reviewed  CBC WITH DIFFERENTIAL/PLATELET - Abnormal; Notable for the following components:      Result Value   RBC 4.01 (*)    Hemoglobin 12.4 (*)    HCT 36.7 (*)    All other components within  normal limits  BASIC METABOLIC PANEL WITH GFR - Abnormal; Notable for the following components:   Sodium 134 (*)    Potassium 3.4 (*)    Glucose, Bld 121 (*)    All other components within normal limits  TROPONIN I (HIGH SENSITIVITY) - Abnormal; Notable for the following components:   Troponin I (High Sensitivity) 34 (*)    All other components within normal limits  TROPONIN I (HIGH SENSITIVITY)     EKG  I, Marylynn Soho, attending physician, personally viewed and interpreted this EKG  EKG Time: 2030 Rate: 95 Rhythm: sinus rhythm Axis: left axis deviation Intervals: qtc 495 QRS: narrow, q waves v1, v2 ST changes: no st elevation Impression: abnormal ekg   RADIOLOGY *** {USE THE WORD "INTERPRETED"!! You MUST document your own interpretation of imaging, as well as the fact that you reviewed the radiologist's report!:1}   PROCEDURES:  Critical Care performed: Yes  CRITICAL CARE Performed by: Marylynn Soho   Total critical care  time: *** minutes  Critical care time was exclusive of separately billable procedures and treating other patients.  Critical care was necessary to treat or prevent imminent or life-threatening deterioration.  Critical care was time spent personally by me on the following activities: development of treatment plan with patient and/or surrogate as well as nursing, discussions with consultants, evaluation of patient's response to treatment, examination of patient, obtaining history from patient or surrogate, ordering and performing treatments and interventions, ordering and review of laboratory studies, ordering and review of radiographic studies, pulse oximetry and re-evaluation of patient's condition.   Procedures    MEDICATIONS ORDERED IN ED: Medications  iohexol  (OMNIPAQUE ) 350 MG/ML injection 75 mL (75 mLs Intravenous Contrast Given 09/17/23 2209)     IMPRESSION / MDM / ASSESSMENT AND PLAN / ED COURSE  I reviewed the triage vital  signs and the nursing notes.                              Differential diagnosis includes, but is not limited to, ***  Patient's presentation is most consistent with {EM COPA:27473}   ***The patient is on the cardiac monitor to evaluate for evidence of arrhythmia and/or significant heart rate changes.  ***      FINAL CLINICAL IMPRESSION(S) / ED DIAGNOSES   Final diagnoses:  Hypertension, unspecified type  Neck pain        Rx / DC Orders   ED Discharge Orders     None        Note:  This document was prepared using Dragon voice recognition software and may include unintentional dictation errors.

## 2023-09-18 DIAGNOSIS — I160 Hypertensive urgency: Secondary | ICD-10-CM | POA: Diagnosis not present

## 2023-09-18 DIAGNOSIS — I214 Non-ST elevation (NSTEMI) myocardial infarction: Secondary | ICD-10-CM

## 2023-09-18 LAB — PROTIME-INR
INR: 1 (ref 0.8–1.2)
Prothrombin Time: 13.3 s (ref 11.4–15.2)

## 2023-09-18 LAB — HEPARIN LEVEL (UNFRACTIONATED)
Heparin Unfractionated: 0.78 [IU]/mL — ABNORMAL HIGH (ref 0.30–0.70)
Heparin Unfractionated: 0.52 [IU]/mL (ref 0.30–0.70)
Heparin Unfractionated: 0.5 [IU]/mL (ref 0.30–0.70)

## 2023-09-18 LAB — CK: Total CK: 170 U/L (ref 49–397)

## 2023-09-18 LAB — TROPONIN I (HIGH SENSITIVITY)
Troponin I (High Sensitivity): 347 ng/L (ref <18)
Troponin I (High Sensitivity): 351 ng/L (ref <18)

## 2023-09-18 LAB — APTT: aPTT: 28 s (ref 24–36)

## 2023-09-18 LAB — HIV ANTIBODY (ROUTINE TESTING W REFLEX): HIV Screen 4th Generation wRfx: NONREACTIVE

## 2023-09-18 MED ORDER — POTASSIUM CHLORIDE CRYS ER 20 MEQ PO TBCR
40.0000 meq | EXTENDED_RELEASE_TABLET | Freq: Once | ORAL | Status: AC
  Administered 2023-09-18: 40 meq via ORAL
  Filled 2023-09-18: qty 2

## 2023-09-18 MED ORDER — ACETAMINOPHEN 325 MG PO TABS: 650.0000 mg | ORAL_TABLET | Freq: Four times a day (QID) | ORAL | Status: DC | PRN

## 2023-09-18 MED ORDER — ONDANSETRON HCL 4 MG PO TABS: 4.0000 mg | ORAL_TABLET | Freq: Four times a day (QID) | ORAL | Status: DC | PRN

## 2023-09-18 MED ORDER — LISINOPRIL 5 MG PO TABS
5.0000 mg | ORAL_TABLET | Freq: Every day | ORAL | Status: DC
  Administered 2023-09-18: 5 mg via ORAL
  Filled 2023-09-18: qty 1

## 2023-09-18 MED ORDER — PANTOPRAZOLE SODIUM 40 MG PO TBEC
40.0000 mg | DELAYED_RELEASE_TABLET | Freq: Every day | ORAL | Status: DC
  Administered 2023-09-18 – 2023-09-19 (×2): 40 mg via ORAL
  Filled 2023-09-18 (×2): qty 1

## 2023-09-18 MED ORDER — CLOPIDOGREL BISULFATE 75 MG PO TABS
75.0000 mg | ORAL_TABLET | Freq: Every day | ORAL | Status: DC
  Administered 2023-09-18 – 2023-09-19 (×2): 75 mg via ORAL
  Filled 2023-09-18 (×2): qty 1

## 2023-09-18 MED ORDER — ROSUVASTATIN CALCIUM 10 MG PO TABS
40.0000 mg | ORAL_TABLET | ORAL | Status: DC
  Administered 2023-09-18: 40 mg via ORAL
  Filled 2023-09-18: qty 4

## 2023-09-18 MED ORDER — HEPARIN BOLUS VIA INFUSION
4000.0000 [IU] | Freq: Once | INTRAVENOUS | Status: AC
  Administered 2023-09-18: 4000 [IU] via INTRAVENOUS
  Filled 2023-09-18: qty 4000

## 2023-09-18 MED ORDER — SODIUM CHLORIDE 0.9 % IV SOLN: INTRAVENOUS | Status: DC

## 2023-09-18 MED ORDER — HEPARIN (PORCINE) 25000 UT/250ML-% IV SOLN
1050.0000 [IU]/h | INTRAVENOUS | Status: DC
  Administered 2023-09-18: 1050 [IU]/h via INTRAVENOUS
  Administered 2023-09-18: 1150 [IU]/h via INTRAVENOUS
  Filled 2023-09-18 (×2): qty 250

## 2023-09-18 MED ORDER — NITROGLYCERIN 0.4 MG SL SUBL
0.4000 mg | SUBLINGUAL_TABLET | SUBLINGUAL | Status: DC | PRN
  Administered 2023-09-18: 0.4 mg via SUBLINGUAL
  Filled 2023-09-18: qty 1

## 2023-09-18 MED ORDER — ASPIRIN 81 MG PO TBEC: 81.0000 mg | DELAYED_RELEASE_TABLET | Freq: Every day | ORAL | Status: DC

## 2023-09-18 MED ORDER — MAGNESIUM HYDROXIDE 400 MG/5ML PO SUSP: 30.0000 mL | Freq: Every day | ORAL | Status: DC | PRN

## 2023-09-18 MED ORDER — TRAZODONE HCL 50 MG PO TABS: 25.0000 mg | ORAL_TABLET | Freq: Every evening | ORAL | Status: DC | PRN

## 2023-09-18 MED ORDER — ASPIRIN 81 MG PO CHEW
324.0000 mg | CHEWABLE_TABLET | Freq: Once | ORAL | Status: AC
  Administered 2023-09-18: 324 mg via ORAL
  Filled 2023-09-18: qty 4

## 2023-09-18 MED ORDER — ONDANSETRON HCL 4 MG/2ML IJ SOLN: 4.0000 mg | Freq: Four times a day (QID) | INTRAMUSCULAR | Status: DC | PRN

## 2023-09-18 MED ORDER — ACETAMINOPHEN 650 MG RE SUPP: 650.0000 mg | Freq: Four times a day (QID) | RECTAL | Status: DC | PRN

## 2023-09-18 MED ORDER — ALBUTEROL SULFATE (2.5 MG/3ML) 0.083% IN NEBU: 3.0000 mL | INHALATION_SOLUTION | Freq: Four times a day (QID) | RESPIRATORY_TRACT | Status: DC | PRN

## 2023-09-18 MED ORDER — MONTELUKAST SODIUM 10 MG PO TABS
10.0000 mg | ORAL_TABLET | Freq: Every day | ORAL | Status: DC
  Administered 2023-09-18: 10 mg via ORAL
  Filled 2023-09-18: qty 1

## 2023-09-18 NOTE — H&P (View-Only) (Signed)
 Cardiology Consultation   Patient ID: Juan Salas MRN: [id number]; DOB: [birth year]  Admit date: 09/17/2023 Date of Consult: 09/18/2023  PCP:  Scherrie Curt, MD   [facility] Providers Cardiologist:  Constancia Delton, MD     Patient Profile: Juan Salas is a 63 y.o. male with a hx of hyperlipidemia, hypertension, left carotid artery stenosis s/p stent 09/08/23, GERD, fibromyalgia who is being seen 09/18/2023 for the evaluation of hypertension at the request of Dr. Jeane Miguel.  History of Present Illness: [name] saw Dr. Junnie Olives 08/29/2023 as a new patient for shortness of breath and chest tightness.  Patient recently saw vascular surgery who found left carotid artery stenosis of 95% on neck CT.  Echo showed EF of 60 to 65%, no wall motion abnormality, no valve disease.  Cardiac CT was not performed due to insurance denial, and Myoview Lexiscan was ordered, but has not been performed yet.  Patient underwent left carotid stent intervention 09/08/2023 by Dr. Vonna Guardian.  The patient presented to the ER at [facility] 09/17/2023 with hypertension and neck pain. Patient was overall feeling really well since the carotid stent and was walking with no chest pain or sob. BP initially normal, but stated to get higher and felt pulsing in his neck worsen and BP increased. He reported blood pressure at home with systolics in the 220s.  He reported he had not been taking his blood pressure medication (lisinopril ) due to low blood pressures. He denies any chest pain or SOB.   In the ER blood pressure 177/100, pulse rate 94, respiratory rate 20, afebrile, 100% O2.  Labs showed sodium 134, potassium 3.4, hemoglobin 12.5.  EKG showed normal sinus rhythm, 95 bpm, no significant edges.  Head CT negative.  CTA head and neck was negative for large vessel occlusion or other emergent finding, patent flow through the stent with focal narrowing at the mid aspect of the stent with up to 50% stenosis.   Discussed with vascular surgery who reported nothing to do at this time, outpatient follow-up recommended.  High-sensitivity troponin 30, 347. He was started on IV heparin  and admitted.   Past Medical History:  Diagnosis Date   Fibromyalgia    GAD (generalized anxiety disorder)    GERD without esophagitis 05/26/2023   History of diverticulitis 05/26/2023   History of kidney stones 1977, 1983,2000   passed on his own. 7 different cycles of stones during youth.   Hyperlipidemia    Left carotid artery stenosis 05/26/2023   Migraine headache    Nocturia associated with benign prostatic hyperplasia 12/2017   Spinal stenosis     Past Surgical History:  Procedure Laterality Date   APPENDECTOMY  2000   CAROTID PTA/STENT INTERVENTION N/A 09/08/2023   Procedure: CAROTID PTA/STENT INTERVENTION;  Surgeon: Celso College, MD;  Location: [facility] INVASIVE CV LAB;  Service: Cardiovascular;  Laterality: N/A;   COLONOSCOPY     COLONOSCOPY WITH PROPOFOL  N/A 09/06/2019   Procedure: COLONOSCOPY WITH PROPOFOL ;  Surgeon: Luke Salaam, MD;  Location: [facility] ENDOSCOPY;  Service: Gastroenterology;  Laterality: N/A;   CYSTOSCOPY WITH INSERTION OF UROLIFT N/A 01/17/2018   Procedure: CYSTOSCOPY WITH INSERTION OF UROLIFT;  Surgeon: Geraline Knapp, MD;  Location: [facility] ORS;  Service: Urology;  Laterality: N/A;   CYSTOSCOPY WITH INSERTION OF UROLIFT  01/2018   CYSTOSCOPY WITH INSERTION OF UROLIFT Bilateral    HERNIA REPAIR Left 2000   double inguinal hernia repairs   HYDROCELE EXCISION Left 10/06/2021   Procedure: HYDROCELECTOMY ADULT GROIN/UNILATERAL SCROTAL  APPROACH;  Surgeon: Geraline Knapp, MD;  Location: [facility] ORS;  Service: Urology;  Laterality: Left;   LUMBAR LAMINECTOMY     laminectomy x 2. 1990, 1994   NASAL SEPTUM SURGERY  1978   repair of sinuses also   TESTICULAR TORSION REPAIR  2005   TONSILLECTOMY  1970     Home Medications:  Prior to Admission medications   Medication Sig Start Date End Date  Taking? Authorizing Provider  acetaminophen  (TYLENOL ) 500 MG tablet Take 500 mg by mouth every 4 (four) hours as needed for moderate pain or headache.    Yes [provider]  albuterol  (VENTOLIN  HFA) 108 (90 Base) MCG/ACT inhaler Inhale 2 puffs into the lungs every 6 (six) hours as needed for wheezing or shortness of breath. 08/18/23  Yes Copland, Jolena Nay, MD  aspirin  EC 81 MG tablet Take 81 mg by mouth daily. Swallow whole.   Yes [provider]  calcium  carbonate (TUMS - DOSED IN MG ELEMENTAL CALCIUM ) 500 MG chewable tablet Chew 1 tablet by mouth 2 (two) times daily as needed for indigestion or heartburn.   Yes [provider]  Cholecalciferol  (VITAMIN D3) 50 MCG (2000 UT) TABS Take 1 tablet by mouth daily at 6 (six) AM.   Yes [provider]  clopidogrel  (PLAVIX ) 75 MG tablet Take 1 tablet (75 mg total) by mouth daily. 08/30/23  Yes Celso College, MD  cyanocobalamin  2000 MCG tablet Take 2,000 mcg by mouth daily.   Yes [provider]  L-THEANINE PO Take 200 mg by mouth daily as needed (stress).   Yes [provider]  lisinopril  (ZESTRIL ) 5 MG tablet Take 5 mg by mouth at bedtime. 08/12/20  Yes [provider]  Magnesium  250 MG TABS Take 250 mg by mouth daily as needed.   Yes [provider]  montelukast  (SINGULAIR ) 10 MG tablet Take 1 tablet (10 mg total) by mouth at bedtime. 08/18/23  Yes Copland, Jolena Nay, MD  naproxen (NAPROSYN) 250 MG tablet Take 250 mg by mouth as needed.   Yes [provider]  Omega-3 Fatty Acids (FISH OIL) 1000 MG CAPS Take 1,000 mg by mouth daily.   Yes [provider]  pantoprazole  (PROTONIX ) 40 MG tablet Take 40 mg by mouth daily.   Yes [provider]  rosuvastatin  (CRESTOR ) 40 MG tablet Take 1 tablet (40 mg total) by mouth every morning. 08/29/23  Yes Agbor-Etang, Polly Brink, MD  tadalafil (CIALIS) 20 MG tablet Take 20 mg by mouth daily as needed. 05/27/21  Yes [provider]   metoprolol  tartrate (LOPRESSOR ) 100 MG tablet TAKE 1 TABLET 2 HR PRIOR TO CARDIAC PROCEDURE Patient not taking: Reported on 09/18/2023 08/29/23   Constancia Delton, MD  Multiple Vitamin (MULTIVITAMIN WITH MINERALS) TABS tablet Take 1 tablet by mouth daily.    [provider]    Scheduled Meds:  [START ON 09/19/2023] aspirin  EC  81 mg Oral Daily   clopidogrel   75 mg Oral Daily   lisinopril   5 mg Oral Daily   montelukast   10 mg Oral QHS   pantoprazole   40 mg Oral Daily   rosuvastatin   40 mg Oral [id number]   Continuous Infusions:  sodium chloride  40 mL/hr at 09/18/23 0827   heparin  1,150 Units/hr (09/18/23 0305)   PRN Meds: acetaminophen  **OR** acetaminophen , albuterol , magnesium  hydroxide, nitroGLYCERIN, ondansetron  **OR** ondansetron  (ZOFRAN ) IV, traZODone  Allergies:    Allergies  Allergen Reactions   Atorvastatin  Other (See Comments)    Joint pain Joint pain  Prednisone Other (See Comments)    After 3 days agitation and sleeplessness After 3 days agitation and sleeplessness    Verapamil  Nausea Only    Other reaction(s): Headache insomnia insomnia    Metoprolol  Palpitations    Per patient, can't tolerate. Per patient, can't tolerate.    Seasonal Ic [Cholestatin] Other (See Comments)    Sinus issues   Sulfa  Antibiotics Rash    Social History:   Social History   Socioeconomic History   Marital status: Married    Spouse name: Debbie   Number of children: 2   Years of education: Not on file   Highest education level: Master's degree (e.g., MA, MS, MEng, MEd, MSW, MBA)  Occupational History   Occupation: Engineer, mining: [employer]  Tobacco Use   Smoking status: Never   Smokeless tobacco: Never  Vaping Use   Vaping status: Never Used  Substance and Sexual Activity   Alcohol use: No   Drug use: No   Sexual activity: Yes  Other Topics Concern   Not on file  Social History Narrative   Non-smoker   No ETOH   No drugs   Married (Debbie)    Exercises at least 3x/week   Theatre manager (Retired)      [employer]   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   Social Drivers of Corporate investment banker Strain: Low Risk  (05/22/2023)   Overall Financial Resource Strain (CARDIA)    Difficulty of Paying Living Expenses: Not hard at all  Food Insecurity: No Food Insecurity (09/18/2023)   Hunger Vital Sign    Worried About Running Out of Food in the Last Year: Never true    Ran Out of Food in the Last Year: Never true  Transportation Needs: No Transportation Needs (09/18/2023)   PRAPARE - Administrator, Civil Service (Medical): No    Lack of Transportation (Non-Medical): No  Physical Activity: Sufficiently Active (05/22/2023)   Exercise Vital Sign    Days of Exercise per Week: 6 days    Minutes of Exercise per Session: 60 min  Stress: No Stress Concern Present (05/22/2023)   [employer] of Occupational Health - Occupational Stress Questionnaire    Feeling of Stress : Only a little  Social Connections: Moderately Isolated (09/18/2023)   Social Connection and Isolation Panel [NHANES]    Frequency of Communication with Friends and Family: Twice a week    Frequency of Social Gatherings with Friends and Family: Once a week    Attends Religious Services: Never    Database administrator or Organizations: No    Attends Engineer, structural: Not on file    Marital Status: Married  Catering manager Violence: Not At Risk (09/18/2023)   Humiliation, Afraid, Rape, and Kick questionnaire    Fear of Current or Ex-Partner: No    Emotionally Abused: No    Physically Abused: No    Sexually Abused: No    Family History:    Family History  Problem Relation Age of Onset   Hypertension Mother    Prostatitis Father    Hypertension Father      ROS:  Please see the history of present illness.   All other ROS reviewed and negative.     Physical Exam/Data: Vitals:   09/18/23 0317 09/18/23  0430 09/18/23 0502 09/18/23 0843  BP: 102/72 111/69 120/78 129/79  Pulse: 69 (!) 58 70 60  Resp:  13 17 18    Temp: 98 F (36.7 C)  (!) 97.4 F (36.3 C) 98.1 F (36.7 C)  TempSrc: Oral   Oral  SpO2: 97% 98% 100% 100%  Weight:   84.3 kg   Height:   6' (1.829 m)    No intake or output data in the 24 hours ending 09/18/23 0949    09/18/2023    5:02 AM 09/08/2023    4:49 PM 09/08/2023    9:50 AM  Last 3 Weights  Weight (lbs) 185 lb 13.6 oz 191 lb 9.3 oz 184 lb 6.4 oz  Weight (kg) 84.3 kg 86.9 kg 83.643 kg     Body mass index is 25.21 kg/m.  General:  Well nourished, well developed, in no acute distress HEENT: normal Neck: no JVD Vascular: No carotid bruits; Distal pulses 2+ bilaterally Cardiac:  normal S1, S2; bradycardia, RR; no murmur  Lungs:  clear to auscultation bilaterally, no wheezing, rhonchi or rales  Abd: soft, nontender, no hepatomegaly  Ext: no edema Musculoskeletal:  No deformities, BUE and BLE strength normal and equal Skin: warm and dry  Neuro:  CNs 2-12 intact, no focal abnormalities noted Psych:  Normal affect   EKG:  The EKG was personally reviewed and demonstrates:  NSR 95bpm, min ST dep V4 and V5 Telemetry:  Telemetry was personally reviewed and demonstrates:  SR/SB HR 50-60s  Relevant CV Studies:  Echo 09/06/23 1. Left ventricular ejection fraction, by estimation, is 60 to 65%. The  left ventricle has normal function. The left ventricle has no regional  wall motion abnormalities. Left ventricular diastolic parameters were  normal.   2. Right ventricular systolic function is normal. The right ventricular  size is normal. Tricuspid regurgitation signal is inadequate for assessing  PA pressure.   3. The mitral valve is normal in structure. No evidence of mitral valve  regurgitation. No evidence of mitral stenosis.   4. The aortic valve is normal in structure. Aortic valve regurgitation is  not visualized. No aortic stenosis is present.   5. The inferior  vena cava is normal in size with greater than 50%  respiratory variability, suggesting right atrial pressure of 3 mmHg.   Laboratory Data: High Sensitivity Troponin:   Recent Labs  Lab 08/26/23 1154 08/26/23 1346 09/17/23 2040 09/18/23 0004  TROPONINIHS 27* 27* 34* 347*     Chemistry Recent Labs  Lab 09/17/23 2040  NA 134*  K 3.4*  CL 101  CO2 22  GLUCOSE 121*  BUN 16  CREATININE 0.99  CALCIUM  9.5  GFRNONAA >60  ANIONGAP 11    No results for input(s): "PROT", "ALBUMIN", "AST", "ALT", "ALKPHOS", "BILITOT" in the last 168 hours. Lipids No results for input(s): "CHOL", "TRIG", "HDL", "LABVLDL", "LDLCALC", "CHOLHDL" in the last 168 hours.  Hematology Recent Labs  Lab 09/17/23 2040  WBC 6.1  RBC 4.01*  HGB 12.4*  HCT 36.7*  MCV 91.5  MCH 30.9  MCHC 33.8  RDW 12.7  PLT 267   Thyroid  No results for input(s): "TSH", "FREET4" in the last 168 hours.  BNPNo results for input(s): "BNP", "PROBNP" in the last 168 hours.  DDimer No results for input(s): "DDIMER" in the last 168 hours.  Radiology/Studies:  CT ANGIO HEAD NECK W WO CM Result Date: 09/17/2023 CLINICAL DATA:  Initial evaluation for acute left neck pain. EXAM: CT ANGIOGRAPHY HEAD AND NECK WITH AND WITHOUT CONTRAST TECHNIQUE: Multidetector CT imaging of the head and neck was performed using the standard protocol during bolus administration  of intravenous contrast. Multiplanar CT image reconstructions and MIPs were obtained to evaluate the vascular anatomy. Carotid stenosis measurements (when applicable) are obtained utilizing NASCET criteria, using the distal internal carotid diameter as the denominator. RADIATION DOSE REDUCTION: This exam was performed according to the departmental dose-optimization program which includes automated exposure control, adjustment of the mA and/or kV according to patient size and/or use of iterative reconstruction technique. CONTRAST:  75mL OMNIPAQUE  IOHEXOL  350 MG/ML SOLN COMPARISON:  MRI  from 12/03/2020 FINDINGS: CT HEAD FINDINGS Brain: Cerebral volume within normal limits. No acute intracranial hemorrhage. No acute large vessel territory infarct. No mass lesion or midline shift. No hydrocephalus or extra-axial fluid collection. Vascular: No abnormal hyperdense vessel. Skull: Scalp soft tissues demonstrate no acute finding. Calvarium intact. Sinuses/Orbits: Globes orbital soft tissues within normal limits. Paranasal sinuses and mastoid air cells are clear. Other: None. Review of the MIP images confirms the above findings CTA NECK FINDINGS Aortic arch: Right-sided arch normal in caliber with standard 3 vessel morphology. Aortic atherosclerosis. No significant stenosis about the origin the great vessels. Right carotid system: Right common and internal carotid arteries are patent without dissection. Mild atheromatous change about the right carotid bulb without hemodynamically significant greater than 50% stenosis. Left carotid system: Left common and internal carotid arteries are patent without dissection. Vascular stent traverses the left carotid bulb. Patent flow through the stent. Focal narrowing at the mid aspect of the stent with up to approximate 50% stenosis by NASCET criteria. Vertebral arteries: Both vertebral arteries arise from the subclavian arteries. Atheromatous plaque at the origin of the left vertebral artery with moderate stenosis. Vertebral arteries patent distally without stenosis or dissection. Skeleton: No discrete or worrisome osseous lesions. Other neck: No other acute finding. Upper chest: No other acute finding. Review of the MIP images confirms the above findings CTA HEAD FINDINGS Anterior circulation: Both internal carotid arteries are patent to the termini without stenosis or other abnormality. A1 segments patent bilaterally. Normal anterior communicating artery complex. Anterior cerebral arteries patent without significant stenosis. No M1 stenosis or occlusion. Distal MCA  branches perfused and symmetric. Posterior circulation: Both V4 segments patent without stenosis. Right vertebral artery slightly dominant. Both PICA patent. Basilar patent without stenosis. Superior cerebellar and posterior cerebral arteries patent bilaterally. Venous sinuses: Patent allowing for timing the contrast bolus. Anatomic variants: As above.  No aneurysm. Review of the MIP images confirms the above findings IMPRESSION: CT HEAD: Negative head CT.  No acute intracranial abnormality. CTA HEAD AND NECK: 1. Negative CTA for large vessel occlusion or other emergent finding. 2. Vascular stent traverses the left carotid bulb. Patent flow through the stent. Focal narrowing at the mid aspect of the stent with up to 50% stenosis by NASCET criteria. 3. Moderate stenosis at the origin of the left vertebral artery. 4.  Aortic Atherosclerosis (ICD10-I70.0). Electronically Signed   By: Virgia Griffins M.D.   On: 09/17/2023 23:07     Assessment and Plan:  HTN - Patient presented with high blood pressure and neck pain found to have elevated troponin and elevated blood pressure.  Recent stenting of the left carotid artery. - Patient reports he was not consistently taking his blood pressure medications due to low blood pressures after the procedure - PTA lisinopril  5 mg daily - In the ER blood pressure 177/100 - PTA lisinopril  5 mg daily restarted - Blood pressures overall look normal  Elevated troponin - Chest pain and shortness of breath reported at initial cardiology visit 08/29/23. Cardiac CTA was  ordered but not performed (due to insurance denial) and subsequent Myoview Lexiscan was ordered - High-sensitivity troponin 30>347. Continue to trend troponin - no chest pain reported, but had neck pain - continue IV heparin  - recent echo showed normal LVEF - continue ASA 81mg  daily, Crestor  40mg  daily - definitely elevated troponin can be from high blood pressure, suspect degree of CAD given PAD.  Although he did not have overt chest pain, but more atypical with neck pain, I think patient would benefit from [facility] NPO at midnight.   Neck pain S/p left carotid stent - CT of the head and neck showed patent blood flow through the stent with stent focal narrowing up to 50% - Vascular surgery recommended outpatient follow-up - continue ASA, Plavix  and Crestor   HLD - LDL 126, TG 296, total chol 242 - continue Crestor      For questions or updates, please contact [facility] Please consult [email or web address] for contact info under    Signed, Birney Belshe Rebekah Canada, PA-C  09/18/2023 9:49 AM

## 2023-09-18 NOTE — Progress Notes (Signed)
 ANTICOAGULATION CONSULT NOTE  Pharmacy Consult for heparin  infusion Indication: ACS/STEMI  Allergies  Allergen Reactions   Atorvastatin  Other (See Comments)    Joint pain Joint pain    Prednisone Other (See Comments)    After 3 days agitation and sleeplessness After 3 days agitation and sleeplessness    Verapamil  Nausea Only    Other reaction(s): Headache insomnia insomnia    Metoprolol  Palpitations    Per patient, can't tolerate. Per patient, can't tolerate.    Seasonal Ic [Cholestatin] Other (See Comments)    Sinus issues   Sulfa  Antibiotics Rash    Patient Measurements: Height: 6' (182.9 cm) Weight: 84.3 kg (185 lb 13.6 oz) IBW/kg (Calculated) : 77.6 HEPARIN  DW (KG): 84.3 Heparin  Dosing Weight: 86.9 kg  Vital Signs: Temp: 98.1 F (36.7 C) (06/01 0843) Temp Source: Oral (06/01 0843) BP: 129/79 (06/01 0843) Pulse Rate: 60 (06/01 0843)  Labs: Recent Labs    09/17/23 2040 09/18/23 0004 09/18/23 0151 09/18/23 0905  HGB 12.4*  --   --   --   HCT 36.7*  --   --   --   PLT 267  --   --   --   APTT  --   --  28  --   LABPROT  --   --  13.3  --   INR  --   --  1.0  --   HEPARINUNFRC  --   --   --  0.78*  CREATININE 0.99  --   --   --   CKTOTAL  --   --   --  170  TROPONINIHS 34* 347*  --  351*    Estimated Creatinine Clearance: 82.7 mL/min (by C-G formula based on SCr of 0.99 mg/dL).   Medical History: Past Medical History:  Diagnosis Date   Fibromyalgia    GAD (generalized anxiety disorder)    GERD without esophagitis 05/26/2023   History of diverticulitis 05/26/2023   History of kidney stones 1977, 1983,2000   passed on his own. 7 different cycles of stones during youth.   Hyperlipidemia    Left carotid artery stenosis 05/26/2023   Migraine headache    Nocturia associated with benign prostatic hyperplasia 12/2017   Spinal stenosis     Assessment: Pt is a 63 yo male w/ recent hx of left carotid artery stent, presenting to ED because of  concerns for high blood pressure and left neck pain, found with elevated troponin I level, trending up.  Goal of Therapy:  Heparin  level 0.3-0.7 units/ml Monitor platelets by anticoagulation protocol: Yes  6/1 0905 HL 0.78, SUPRAtherapeutic   Plan:  Decrease heparin  infusion to 1050 units/hr Will check HL in 6 hr after rate change CBC daily while on heparin   Ammaar Encina A Takoda Siedlecki, PharmD Clinical Pharmacist 09/18/2023 11:13 AM

## 2023-09-18 NOTE — Progress Notes (Signed)
 ANTICOAGULATION CONSULT NOTE  Pharmacy Consult for heparin  infusion Indication: ACS/STEMI  Allergies  Allergen Reactions   Atorvastatin  Other (See Comments)    Joint pain Joint pain    Prednisone Other (See Comments)    After 3 days agitation and sleeplessness After 3 days agitation and sleeplessness    Verapamil  Nausea Only    Other reaction(s): Headache insomnia insomnia    Metoprolol  Palpitations    Per patient, can't tolerate. Per patient, can't tolerate.    Seasonal Ic [Cholestatin] Other (See Comments)    Sinus issues   Sulfa  Antibiotics Rash    Patient Measurements:   Heparin  Dosing Weight: 86.9 kg  Vital Signs: Temp: 97.7 F (36.5 C) (05/31 2033) Temp Source: Oral (05/31 2033) BP: 120/83 (05/31 2330) Pulse Rate: 76 (05/31 2330)  Labs: Recent Labs    09/17/23 2040 09/18/23 0004  HGB 12.4*  --   HCT 36.7*  --   PLT 267  --   CREATININE 0.99  --   TROPONINIHS 34* 347*    Estimated Creatinine Clearance: 82.7 mL/min (by C-G formula based on SCr of 0.99 mg/dL).   Medical History: Past Medical History:  Diagnosis Date   Fibromyalgia    GAD (generalized anxiety disorder)    GERD without esophagitis 05/26/2023   History of diverticulitis 05/26/2023   History of kidney stones 1977, 1983,2000   passed on his own. 7 different cycles of stones during youth.   Hyperlipidemia    Left carotid artery stenosis 05/26/2023   Migraine headache    Nocturia associated with benign prostatic hyperplasia 12/2017   Spinal stenosis     Assessment: Pt is a 63 yo male w/ recent hx of left carotid artery stent, presenting to ED because of concerns for high blood pressure and left neck pain, found with elevated troponin I level, trending up.  Goal of Therapy:  Heparin  level 0.3-0.7 units/ml Monitor platelets by anticoagulation protocol: Yes   Plan:  Bolus 4000 units x 1 Start heparin  infusion at 1150 units/hr Will check HL in 6 hr after start of infusion CBC  daily while on heparin   Coretta Dexter, PharmD, [facility] 09/18/2023 2:06 AM

## 2023-09-18 NOTE — H&P (Signed)
 History and Physical    Juan Salas:[id number] DOB: 1960/05/29 DOA: 09/17/2023  PCP: Scherrie Curt, MD (Confirm with patient/family/NH records and if not entered, this has to be entered at [facility] point of entry) Patient coming from: Home  I have personally briefly reviewed patient's old medical records in [facility]  Chief Complaint: Neck pain  HPI: Juan Salas is a 63 y.o. male with medical history significant of carotic stenosis status post left-sided carotid stenting recently, HTN, HLD, mild intermittent asthma, presented with new onset of left-sided neck pain.  Patient has a strong family history of carotid stenosis, during a recent screening test it was found more than 95% stenosis of left carotid, for which patient underwent stenting of left carotid on 09/08/23.  Patient tolerated procedure well and went home.  Yesterday evening, around 5 PM, patient started to have throbbing-like left-sided neck pain, 10/10, constant but denied any chest pain shortness of breath headache.  He checked his blood pressure SBP more than 200 compared to baseline 120-130 after the carotid stenting.  And decided to come to ED.  Left-sided neck pain subsided between 830-9 PM. 2 weeks ago he had an episode of chest pain and general weakness come to ED, cardiac workup including echocardiogram and troponin EKG were normal and patient was reassured and sent home.  ED Course: Afebrile, initial blood pressure 170/100 came down to 120/80 after neck pain subsided, O2 saturation 100% on room air.  CTA showed patent blood flow through the stent focal narrowing up to 50% otherwise no acute findings.  Troponin 30> 347, blood work showed sodium 134 potassium 3.4 BUN 16 creatinine 0.9.  Patient was started on heparin  drip.  Review of Systems: As per HPI otherwise 14 point review of systems negative.    Past Medical History:  Diagnosis Date   Fibromyalgia    GAD (generalized anxiety disorder)    GERD  without esophagitis 05/26/2023   History of diverticulitis 05/26/2023   History of kidney stones 1977, 1983,2000   passed on his own. 7 different cycles of stones during youth.   Hyperlipidemia    Left carotid artery stenosis 05/26/2023   Migraine headache    Nocturia associated with benign prostatic hyperplasia 12/2017   Spinal stenosis     Past Surgical History:  Procedure Laterality Date   APPENDECTOMY  2000   CAROTID PTA/STENT INTERVENTION N/A 09/08/2023   Procedure: CAROTID PTA/STENT INTERVENTION;  Surgeon: Celso College, MD;  Location: [facility] INVASIVE CV LAB;  Service: Cardiovascular;  Laterality: N/A;   COLONOSCOPY     COLONOSCOPY WITH PROPOFOL  N/A 09/06/2019   Procedure: COLONOSCOPY WITH PROPOFOL ;  Surgeon: Luke Salaam, MD;  Location: [facility] ENDOSCOPY;  Service: Gastroenterology;  Laterality: N/A;   CYSTOSCOPY WITH INSERTION OF UROLIFT N/A 01/17/2018   Procedure: CYSTOSCOPY WITH INSERTION OF UROLIFT;  Surgeon: Geraline Knapp, MD;  Location: [facility] ORS;  Service: Urology;  Laterality: N/A;   CYSTOSCOPY WITH INSERTION OF UROLIFT  01/2018   CYSTOSCOPY WITH INSERTION OF UROLIFT Bilateral    HERNIA REPAIR Left 2000   double inguinal hernia repairs   HYDROCELE EXCISION Left 10/06/2021   Procedure: HYDROCELECTOMY ADULT GROIN/UNILATERAL SCROTAL APPROACH;  Surgeon: Geraline Knapp, MD;  Location: [facility] ORS;  Service: Urology;  Laterality: Left;   LUMBAR LAMINECTOMY     laminectomy x 2. 1990, 1994   NASAL SEPTUM SURGERY  1978   repair of sinuses also   TESTICULAR TORSION REPAIR  2005   TONSILLECTOMY  1970  reports that he has never smoked. He has never used smokeless tobacco. He reports that he does not drink alcohol and does not use drugs.  Allergies  Allergen Reactions   Atorvastatin  Other (See Comments)    Joint pain Joint pain    Prednisone Other (See Comments)    After 3 days agitation and sleeplessness After 3 days agitation and sleeplessness    Verapamil  Nausea Only     Other reaction(s): Headache insomnia insomnia    Metoprolol  Palpitations    Per patient, can't tolerate. Per patient, can't tolerate.    Seasonal Ic [Cholestatin] Other (See Comments)    Sinus issues   Sulfa  Antibiotics Rash    Family History  Problem Relation Age of Onset   Hypertension Mother    Prostatitis Father    Hypertension Father      Prior to Admission medications   Medication Sig Start Date End Date Taking? Authorizing Provider  acetaminophen  (TYLENOL ) 500 MG tablet Take 500 mg by mouth every 4 (four) hours as needed for moderate pain or headache.    Yes [provider]  albuterol  (VENTOLIN  HFA) 108 (90 Base) MCG/ACT inhaler Inhale 2 puffs into the lungs every 6 (six) hours as needed for wheezing or shortness of breath. 08/18/23  Yes Copland, Spencer, MD  aspirin  EC 81 MG tablet Take 81 mg by mouth daily. Swallow whole.   Yes [provider]  calcium  carbonate (TUMS - DOSED IN MG ELEMENTAL CALCIUM ) 500 MG chewable tablet Chew 1 tablet by mouth 2 (two) times daily as needed for indigestion or heartburn.   Yes [provider]  Cholecalciferol  (VITAMIN D3) 50 MCG (2000 UT) TABS Take 1 tablet by mouth daily at 6 (six) AM.   Yes [provider]  clopidogrel  (PLAVIX ) 75 MG tablet Take 1 tablet (75 mg total) by mouth daily. 08/30/23  Yes Celso College, MD  cyanocobalamin  2000 MCG tablet Take 2,000 mcg by mouth daily.   Yes [provider]  L-THEANINE PO Take 200 mg by mouth daily as needed (stress).   Yes [provider]  lisinopril  (ZESTRIL ) 5 MG tablet Take 5 mg by mouth at bedtime. 08/12/20  Yes [provider]  Magnesium  250 MG TABS Take 250 mg by mouth daily as needed.   Yes [provider]  montelukast  (SINGULAIR ) 10 MG tablet Take 1 tablet (10 mg total) by mouth at bedtime. 08/18/23  Yes Copland, Jolena Nay, MD  naproxen (NAPROSYN) 250 MG tablet Take 250 mg by mouth as needed.   Yes [provider]   Omega-3 Fatty Acids (FISH OIL) 1000 MG CAPS Take 1,000 mg by mouth daily.   Yes [provider]  pantoprazole  (PROTONIX ) 40 MG tablet Take 40 mg by mouth daily.   Yes [provider]  rosuvastatin  (CRESTOR ) 40 MG tablet Take 1 tablet (40 mg total) by mouth every morning. 08/29/23  Yes Agbor-Etang, Polly Brink, MD  tadalafil (CIALIS) 20 MG tablet Take 20 mg by mouth daily as needed. 05/27/21  Yes [provider]  metoprolol  tartrate (LOPRESSOR ) 100 MG tablet TAKE 1 TABLET 2 HR PRIOR TO CARDIAC PROCEDURE Patient not taking: Reported on 09/18/2023 08/29/23   Constancia Delton, MD  Multiple Vitamin (MULTIVITAMIN WITH MINERALS) TABS tablet Take 1 tablet by mouth daily.    [provider]    Physical Exam: Vitals:   09/18/23 0317 09/18/23 0430 09/18/23 0502 09/18/23 0843  BP: 102/72 111/69 120/78 129/79  Pulse: 69 (!) 58 70 60  Resp: 13  17 18   Temp: 98 F (36.7 C)  (!) 97.4 F (36.3 C) 98.1 F (36.7 C)  TempSrc: Oral   Oral  SpO2: 97% 98% 100% 100%  Weight:   84.3 kg   Height:   6' (1.829 m)     Constitutional: NAD, calm, comfortable Vitals:   09/18/23 0317 09/18/23 0430 09/18/23 0502 09/18/23 0843  BP: 102/72 111/69 120/78 129/79  Pulse: 69 (!) 58 70 60  Resp: 13 17 18    Temp: 98 F (36.7 C)  (!) 97.4 F (36.3 C) 98.1 F (36.7 C)  TempSrc: Oral   Oral  SpO2: 97% 98% 100% 100%  Weight:   84.3 kg   Height:   6' (1.829 m)    Eyes: PERRL, lids and conjunctivae normal ENMT: Mucous membranes are moist. Posterior pharynx clear of any exudate or lesions.Normal dentition.  Neck: normal, supple, no masses, no thyromegaly Respiratory: clear to auscultation bilaterally, no wheezing, no crackles. Normal respiratory effort. No accessory muscle use.  Cardiovascular: Regular rate and rhythm, no murmurs / rubs / gallops. No extremity edema. 2+ pedal pulses. No carotid bruits.  Abdomen: no tenderness, no masses palpated. No hepatosplenomegaly. Bowel sounds positive.   Musculoskeletal: no clubbing / cyanosis. No joint deformity upper and lower extremities. Good ROM, no contractures. Normal muscle tone.  Skin: no rashes, lesions, ulcers. No induration Neurologic: CN 2-12 grossly intact. Sensation intact, DTR normal. Strength 5/5 in all 4.  Psychiatric: Normal judgment and insight. Alert and oriented x 3. Normal mood.     Labs on Admission: I have personally reviewed following labs and imaging studies  CBC: Recent Labs  Lab 09/17/23 2040  WBC 6.1  NEUTROABS 3.3  HGB 12.4*  HCT 36.7*  MCV 91.5  PLT 267   Basic Metabolic Panel: Recent Labs  Lab 09/17/23 2040  NA 134*  K 3.4*  CL 101  CO2 22  GLUCOSE 121*  BUN 16  CREATININE 0.99  CALCIUM  9.5   GFR: Estimated Creatinine Clearance: 82.7 mL/min (by C-G formula based on SCr of 0.99 mg/dL). Liver Function Tests: No results for input(s): "AST", "ALT", "ALKPHOS", "BILITOT", "PROT", "ALBUMIN" in the last 168 hours. No results for input(s): "LIPASE", "AMYLASE" in the last 168 hours. No results for input(s): "AMMONIA" in the last 168 hours. Coagulation Profile: Recent Labs  Lab 09/18/23 0151  INR 1.0   Cardiac Enzymes: No results for input(s): "CKTOTAL", "CKMB", "CKMBINDEX", "TROPONINI" in the last 168 hours. BNP (last 3 results) No results for input(s): "PROBNP" in the last 8760 hours. HbA1C: No results for input(s): "HGBA1C" in the last 72 hours. CBG: No results for input(s): "GLUCAP" in the last 168 hours. Lipid Profile: No results for input(s): "CHOL", "HDL", "LDLCALC", "TRIG", "CHOLHDL", "LDLDIRECT" in the last 72 hours. Thyroid  Function Tests: No results for input(s): "TSH", "T4TOTAL", "FREET4", "T3FREE", "THYROIDAB" in the last 72 hours. Anemia Panel: No results for input(s): "VITAMINB12", "FOLATE", "FERRITIN", "TIBC", "IRON", "RETICCTPCT" in the last 72 hours. Urine analysis:    Component Value Date/Time   COLORURINE AMBER (A) 02/05/2016 0829   APPEARANCEUR Clear  01/12/2018 1057   LABSPEC 1.011 02/05/2016 0829   PHURINE 7.0 02/05/2016 0829   GLUCOSEU Negative 01/12/2018 1057   HGBUR NEGATIVE 02/05/2016 0829   BILIRUBINUR Negative 01/12/2018 1057   KETONESUR 1+ (A) 02/05/2016 0829   PROTEINUR Negative 01/12/2018 1057   PROTEINUR 30 (A) 02/05/2016 0829   NITRITE Negative 01/12/2018 1057   NITRITE POSITIVE (A) 02/05/2016 0829   LEUKOCYTESUR Negative 01/12/2018 1057  Radiological Exams on Admission: CT ANGIO HEAD NECK W WO CM Result Date: 09/17/2023 CLINICAL DATA:  Initial evaluation for acute left neck pain. EXAM: CT ANGIOGRAPHY HEAD AND NECK WITH AND WITHOUT CONTRAST TECHNIQUE: Multidetector CT imaging of the head and neck was performed using the standard protocol during bolus administration of intravenous contrast. Multiplanar CT image reconstructions and MIPs were obtained to evaluate the vascular anatomy. Carotid stenosis measurements (when applicable) are obtained utilizing NASCET criteria, using the distal internal carotid diameter as the denominator. RADIATION DOSE REDUCTION: This exam was performed according to the departmental dose-optimization program which includes automated exposure control, adjustment of the mA and/or kV according to patient size and/or use of iterative reconstruction technique. CONTRAST:  75mL OMNIPAQUE  IOHEXOL  350 MG/ML SOLN COMPARISON:  MRI from 12/03/2020 FINDINGS: CT HEAD FINDINGS Brain: Cerebral volume within normal limits. No acute intracranial hemorrhage. No acute large vessel territory infarct. No mass lesion or midline shift. No hydrocephalus or extra-axial fluid collection. Vascular: No abnormal hyperdense vessel. Skull: Scalp soft tissues demonstrate no acute finding. Calvarium intact. Sinuses/Orbits: Globes orbital soft tissues within normal limits. Paranasal sinuses and mastoid air cells are clear. Other: None. Review of the MIP images confirms the above findings CTA NECK FINDINGS Aortic arch: Right-sided arch normal  in caliber with standard 3 vessel morphology. Aortic atherosclerosis. No significant stenosis about the origin the great vessels. Right carotid system: Right common and internal carotid arteries are patent without dissection. Mild atheromatous change about the right carotid bulb without hemodynamically significant greater than 50% stenosis. Left carotid system: Left common and internal carotid arteries are patent without dissection. Vascular stent traverses the left carotid bulb. Patent flow through the stent. Focal narrowing at the mid aspect of the stent with up to approximate 50% stenosis by NASCET criteria. Vertebral arteries: Both vertebral arteries arise from the subclavian arteries. Atheromatous plaque at the origin of the left vertebral artery with moderate stenosis. Vertebral arteries patent distally without stenosis or dissection. Skeleton: No discrete or worrisome osseous lesions. Other neck: No other acute finding. Upper chest: No other acute finding. Review of the MIP images confirms the above findings CTA HEAD FINDINGS Anterior circulation: Both internal carotid arteries are patent to the termini without stenosis or other abnormality. A1 segments patent bilaterally. Normal anterior communicating artery complex. Anterior cerebral arteries patent without significant stenosis. No M1 stenosis or occlusion. Distal MCA branches perfused and symmetric. Posterior circulation: Both V4 segments patent without stenosis. Right vertebral artery slightly dominant. Both PICA patent. Basilar patent without stenosis. Superior cerebellar and posterior cerebral arteries patent bilaterally. Venous sinuses: Patent allowing for timing the contrast bolus. Anatomic variants: As above.  No aneurysm. Review of the MIP images confirms the above findings IMPRESSION: CT HEAD: Negative head CT.  No acute intracranial abnormality. CTA HEAD AND NECK: 1. Negative CTA for large vessel occlusion or other emergent finding. 2. Vascular  stent traverses the left carotid bulb. Patent flow through the stent. Focal narrowing at the mid aspect of the stent with up to 50% stenosis by NASCET criteria. 3. Moderate stenosis at the origin of the left vertebral artery. 4.  Aortic Atherosclerosis (ICD10-I70.0). Electronically Signed   By: Virgia Griffins M.D.   On: 09/17/2023 23:07    EKG: Independently reviewed.  Sinus rhythm, no acute ST changes.  Assessment/Plan Principal Problem:   Hypertensive urgency  (please populate well all problems here in Problem List. (For example, if patient is on BP meds at home and you resume or decide to hold them,  it is a problem that needs to be her. Same for CAD, COPD, HLD and so on)  NSTEMI - No significant chest pain or EKG changes but uptrending of troponins.  Etiology unclear, but currently will be managed as NSTEMI.  Continue to trend troponins and repeat EKG - Continue heparin  drip - Continue Plavix  aspirin  and statin - Consult cardiology  HTN - Blood pressure normalized after NightPain subsided - Continue lisinopril  5 mg daily  Carotid stenosis status post recent stenting - CTA showed no acute findings and patent left carotid stenting, image study was reviewed by vascular surgeon who recommended no further intervention. - Continue aspirin  Plavix  and statin  Mild intermittent asthma - No acute concern - Continue as needed breathing meds  DVT prophylaxis: Heparin  drip Code Status: Full code Family Communication: Wife at bedside Disposition Plan: Patient is sick with NSTEMI requiring ACS medication including heparin  drip for 2 days and inpatient cardiology consultation, expect more than 2 midnight hospital stay Consults called: Cardiology Admission status: PCU admit   [place] MD [facility] Pager [phone]  09/18/2023, 8:44 AM

## 2023-09-18 NOTE — Consult Note (Signed)
 Cardiology Consultation   Patient ID: Juan Salas MRN: [id number]; DOB: [birth year]  Admit date: 09/17/2023 Date of Consult: 09/18/2023  PCP:  Juan Curt, Salas   [facility] Providers Cardiologist:  Juan Delton, Salas     Patient Profile: Juan Salas is a 63 y.o. male with a hx of hyperlipidemia, hypertension, left carotid artery stenosis s/p stent 09/08/23, GERD, fibromyalgia who is being seen 09/18/2023 for the evaluation of hypertension at the request of Dr. Jeane Salas.  History of Present Illness: Juan Salas saw Dr. Junnie Salas 08/29/2023 as a new patient for shortness of breath and chest tightness.  Patient recently saw vascular surgery who found left carotid artery stenosis of 95% on neck CT.  Echo showed EF of 60 to 65%, no wall motion abnormality, no valve disease.  Cardiac CT was not performed due to insurance denial, and Myoview Lexiscan was ordered, but has not been performed yet.  Patient underwent left carotid stent intervention 09/08/2023 by Dr. Vonna Salas.  The patient presented to the ER at [facility] 09/17/2023 with hypertension and neck pain. Patient was overall feeling really well since the carotid stent and was walking with no chest pain or sob. BP initially normal, but stated to get higher and felt pulsing in his neck worsen and BP increased. He reported blood pressure at home with systolics in the 220s.  He reported he had not been taking his blood pressure medication (lisinopril ) due to low blood pressures. He denies any chest pain or SOB.   In the ER blood pressure 177/100, pulse rate 94, respiratory rate 20, afebrile, 100% O2.  Labs showed sodium 134, potassium 3.4, hemoglobin 12.5.  EKG showed normal sinus rhythm, 95 bpm, no significant edges.  Head CT negative.  CTA head and neck was negative for large vessel occlusion or other emergent finding, patent flow through the stent with focal narrowing at the mid aspect of the stent with up to 50% stenosis.   Discussed with vascular surgery who reported nothing to do at this time, outpatient follow-up recommended.  High-sensitivity troponin 30, 347. He was started on IV heparin  and admitted.   Past Medical History:  Diagnosis Date   Fibromyalgia    GAD (generalized anxiety disorder)    GERD without esophagitis 05/26/2023   History of diverticulitis 05/26/2023   History of kidney stones 1977, 1983,2000   passed on his own. 7 different cycles of stones during youth.   Hyperlipidemia    Left carotid artery stenosis 05/26/2023   Migraine headache    Nocturia associated with benign prostatic hyperplasia 12/2017   Spinal stenosis     Past Surgical History:  Procedure Laterality Date   APPENDECTOMY  2000   CAROTID PTA/STENT INTERVENTION N/A 09/08/2023   Procedure: CAROTID PTA/STENT INTERVENTION;  Surgeon: Juan College, Salas;  Location: [facility] INVASIVE CV LAB;  Service: Cardiovascular;  Laterality: N/A;   COLONOSCOPY     COLONOSCOPY WITH PROPOFOL  N/A 09/06/2019   Procedure: COLONOSCOPY WITH PROPOFOL ;  Surgeon: Juan Salaam, Salas;  Location: [facility] ENDOSCOPY;  Service: Gastroenterology;  Laterality: N/A;   CYSTOSCOPY WITH INSERTION OF UROLIFT N/A 01/17/2018   Procedure: CYSTOSCOPY WITH INSERTION OF UROLIFT;  Surgeon: Juan Knapp, Salas;  Location: [facility] ORS;  Service: Urology;  Laterality: N/A;   CYSTOSCOPY WITH INSERTION OF UROLIFT  01/2018   CYSTOSCOPY WITH INSERTION OF UROLIFT Bilateral    HERNIA REPAIR Left 2000   double inguinal hernia repairs   HYDROCELE EXCISION Left 10/06/2021   Procedure: HYDROCELECTOMY ADULT GROIN/UNILATERAL SCROTAL  APPROACH;  Surgeon: Juan Knapp, Salas;  Location: [facility] ORS;  Service: Urology;  Laterality: Left;   LUMBAR LAMINECTOMY     laminectomy x 2. 1990, 1994   NASAL SEPTUM SURGERY  1978   repair of sinuses also   TESTICULAR TORSION REPAIR  2005   TONSILLECTOMY  1970     Home Medications:  Prior to Admission medications   Medication Sig Start Date End Date  Taking? Authorizing Provider  acetaminophen  (TYLENOL ) 500 MG tablet Take 500 mg by mouth every 4 (four) hours as needed for moderate pain or headache.    Yes Juan Salas  albuterol  (VENTOLIN  HFA) 108 (90 Base) MCG/ACT inhaler Inhale 2 puffs into the lungs every 6 (six) hours as needed for wheezing or shortness of breath. 08/18/23  Yes Salas, Juan Nay, Salas  aspirin  EC 81 MG tablet Take 81 mg by mouth daily. Swallow whole.   Yes Juan Salas  calcium  carbonate (TUMS - DOSED IN MG ELEMENTAL CALCIUM ) 500 MG chewable tablet Chew 1 tablet by mouth 2 (two) times daily as needed for indigestion or heartburn.   Yes Juan Salas  Cholecalciferol  (VITAMIN D3) 50 MCG (2000 UT) TABS Take 1 tablet by mouth daily at 6 (six) AM.   Yes Juan Salas  clopidogrel  (PLAVIX ) 75 MG tablet Take 1 tablet (75 mg total) by mouth daily. 08/30/23  Yes Juan College, Salas  cyanocobalamin  2000 MCG tablet Take 2,000 mcg by mouth daily.   Yes Juan Salas  L-THEANINE PO Take 200 mg by mouth daily as needed (stress).   Yes Juan Salas  lisinopril  (ZESTRIL ) 5 MG tablet Take 5 mg by mouth at bedtime. 08/12/20  Yes Juan Salas  Magnesium  250 MG TABS Take 250 mg by mouth daily as needed.   Yes Juan Salas  montelukast  (SINGULAIR ) 10 MG tablet Take 1 tablet (10 mg total) by mouth at bedtime. 08/18/23  Yes Salas, Juan Nay, Salas  naproxen (NAPROSYN) 250 MG tablet Take 250 mg by mouth as needed.   Yes Juan Salas  Omega-3 Fatty Acids (FISH OIL) 1000 MG CAPS Take 1,000 mg by mouth daily.   Yes Juan Salas  pantoprazole  (PROTONIX ) 40 MG tablet Take 40 mg by mouth daily.   Yes Juan Salas  rosuvastatin  (CRESTOR ) 40 MG tablet Take 1 tablet (40 mg total) by mouth every morning. 08/29/23  Yes Agbor-Etang, Juan Brink, Salas  tadalafil (CIALIS) 20 MG tablet Take 20 mg by mouth daily as needed. 05/27/21  Yes Juan Salas   metoprolol  tartrate (LOPRESSOR ) 100 MG tablet TAKE 1 TABLET 2 HR PRIOR TO CARDIAC PROCEDURE Patient not taking: Reported on 09/18/2023 08/29/23   Juan Delton, Salas  Multiple Vitamin (MULTIVITAMIN WITH MINERALS) TABS tablet Take 1 tablet by mouth daily.    Juan Salas    Scheduled Meds:  [START ON 09/19/2023] aspirin  EC  81 mg Oral Daily   clopidogrel   75 mg Oral Daily   lisinopril   5 mg Oral Daily   montelukast   10 mg Oral QHS   pantoprazole   40 mg Oral Daily   rosuvastatin   40 mg Oral [id number]   Continuous Infusions:  sodium chloride  40 mL/hr at 09/18/23 0827   heparin  1,150 Units/hr (09/18/23 0305)   PRN Meds: acetaminophen  **OR** acetaminophen , albuterol , magnesium  hydroxide, nitroGLYCERIN, ondansetron  **OR** ondansetron  (ZOFRAN ) IV, traZODone  Allergies:    Allergies  Allergen Reactions   Atorvastatin  Other (See Comments)    Joint pain Joint pain  Prednisone Other (See Comments)    After 3 days agitation and sleeplessness After 3 days agitation and sleeplessness    Verapamil  Nausea Only    Other reaction(s): Headache insomnia insomnia    Metoprolol  Palpitations    Per patient, can't tolerate. Per patient, can't tolerate.    Seasonal Ic [Cholestatin] Other (See Comments)    Sinus issues   Sulfa  Antibiotics Rash    Social History:   Social History   Socioeconomic History   Marital status: Married    Spouse name: Debbie   Number of children: 2   Years of education: Not on file   Highest education level: Master's degree (e.g., MA, MS, MEng, MEd, MSW, MBA)  Occupational History   Occupation: Engineer, mining: [employer]  Tobacco Use   Smoking status: Never   Smokeless tobacco: Never  Vaping Use   Vaping status: Never Used  Substance and Sexual Activity   Alcohol use: No   Drug use: No   Sexual activity: Yes  Other Topics Concern   Not on file  Social History Narrative   Non-smoker   No ETOH   No drugs   Married (Debbie)    Exercises at least 3x/week   Theatre manager (Retired)      [employer]   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   Social Drivers of Corporate investment banker Strain: Low Risk  (05/22/2023)   Overall Financial Resource Strain (CARDIA)    Difficulty of Paying Living Expenses: Not hard at all  Food Insecurity: No Food Insecurity (09/18/2023)   Hunger Vital Sign    Worried About Running Out of Food in the Last Year: Never true    Ran Out of Food in the Last Year: Never true  Transportation Needs: No Transportation Needs (09/18/2023)   PRAPARE - Administrator, Civil Service (Medical): No    Lack of Transportation (Non-Medical): No  Physical Activity: Sufficiently Active (05/22/2023)   Exercise Vital Sign    Days of Exercise per Week: 6 days    Minutes of Exercise per Session: 60 min  Stress: No Stress Concern Present (05/22/2023)   [employer] of Occupational Health - Occupational Stress Questionnaire    Feeling of Stress : Only a little  Social Connections: Moderately Isolated (09/18/2023)   Social Connection and Isolation Panel [NHANES]    Frequency of Communication with Friends and Family: Twice a week    Frequency of Social Gatherings with Friends and Family: Once a week    Attends Religious Services: Never    Database administrator or Organizations: No    Attends Engineer, structural: Not on file    Marital Status: Married  Catering manager Violence: Not At Risk (09/18/2023)   Humiliation, Afraid, Rape, and Kick questionnaire    Fear of Current or Ex-Partner: No    Emotionally Abused: No    Physically Abused: No    Sexually Abused: No    Family History:    Family History  Problem Relation Age of Onset   Hypertension Mother    Prostatitis Father    Hypertension Father      ROS:  Please see the history of present illness.   All other ROS reviewed and negative.     Physical Exam/Data: Vitals:   09/18/23 0317 09/18/23  0430 09/18/23 0502 09/18/23 0843  BP: 102/72 111/69 120/78 129/79  Pulse: 69 (!) 58 70 60  Resp:  13 17 18    Temp: 98 F (36.7 C)  (!) 97.4 F (36.3 C) 98.1 F (36.7 C)  TempSrc: Oral   Oral  SpO2: 97% 98% 100% 100%  Weight:   84.3 kg   Height:   6' (1.829 m)    No intake or output data in the 24 hours ending 09/18/23 0949    09/18/2023    5:02 AM 09/08/2023    4:49 PM 09/08/2023    9:50 AM  Last 3 Weights  Weight (lbs) 185 lb 13.6 oz 191 lb 9.3 oz 184 lb 6.4 oz  Weight (kg) 84.3 kg 86.9 kg 83.643 kg     Body mass index is 25.21 kg/m.  General:  Well nourished, well developed, in no acute distress HEENT: normal Neck: no JVD Vascular: No carotid bruits; Distal pulses 2+ bilaterally Cardiac:  normal S1, S2; bradycardia, RR; no murmur  Lungs:  clear to auscultation bilaterally, no wheezing, rhonchi or rales  Abd: soft, nontender, no hepatomegaly  Ext: no edema Musculoskeletal:  No deformities, BUE and BLE strength normal and equal Skin: warm and dry  Neuro:  CNs 2-12 intact, no focal abnormalities noted Psych:  Normal affect   EKG:  The EKG was personally reviewed and demonstrates:  NSR 95bpm, min ST dep V4 and V5 Telemetry:  Telemetry was personally reviewed and demonstrates:  SR/SB HR 50-60s  Relevant CV Studies:  Echo 09/06/23 1. Left ventricular ejection fraction, by estimation, is 60 to 65%. The  left ventricle has normal function. The left ventricle has no regional  wall motion abnormalities. Left ventricular diastolic parameters were  normal.   2. Right ventricular systolic function is normal. The right ventricular  size is normal. Tricuspid regurgitation signal is inadequate for assessing  PA pressure.   3. The mitral valve is normal in structure. No evidence of mitral valve  regurgitation. No evidence of mitral stenosis.   4. The aortic valve is normal in structure. Aortic valve regurgitation is  not visualized. No aortic stenosis is present.   5. The inferior  vena cava is normal in size with greater than 50%  respiratory variability, suggesting right atrial pressure of 3 mmHg.   Laboratory Data: High Sensitivity Troponin:   Recent Labs  Lab 08/26/23 1154 08/26/23 1346 09/17/23 2040 09/18/23 0004  TROPONINIHS 27* 27* 34* 347*     Chemistry Recent Labs  Lab 09/17/23 2040  NA 134*  K 3.4*  CL 101  CO2 22  GLUCOSE 121*  BUN 16  CREATININE 0.99  CALCIUM  9.5  GFRNONAA >60  ANIONGAP 11    No results for input(s): "PROT", "ALBUMIN", "AST", "ALT", "ALKPHOS", "BILITOT" in the last 168 hours. Lipids No results for input(s): "CHOL", "TRIG", "HDL", "LABVLDL", "LDLCALC", "CHOLHDL" in the last 168 hours.  Hematology Recent Labs  Lab 09/17/23 2040  WBC 6.1  RBC 4.01*  HGB 12.4*  HCT 36.7*  MCV 91.5  MCH 30.9  MCHC 33.8  RDW 12.7  PLT 267   Thyroid  No results for input(s): "TSH", "FREET4" in the last 168 hours.  BNPNo results for input(s): "BNP", "PROBNP" in the last 168 hours.  DDimer No results for input(s): "DDIMER" in the last 168 hours.  Radiology/Studies:  CT ANGIO HEAD NECK W WO CM Result Date: 09/17/2023 CLINICAL DATA:  Initial evaluation for acute left neck pain. EXAM: CT ANGIOGRAPHY HEAD AND NECK WITH AND WITHOUT CONTRAST TECHNIQUE: Multidetector CT imaging of the head and neck was performed using the standard protocol during bolus administration  of intravenous contrast. Multiplanar CT image reconstructions and MIPs were obtained to evaluate the vascular anatomy. Carotid stenosis measurements (when applicable) are obtained utilizing NASCET criteria, using the distal internal carotid diameter as the denominator. RADIATION DOSE REDUCTION: This exam was performed according to the departmental dose-optimization program which includes automated exposure control, adjustment of the mA and/or kV according to patient size and/or use of iterative reconstruction technique. CONTRAST:  75mL OMNIPAQUE  IOHEXOL  350 MG/ML SOLN COMPARISON:  MRI  from 12/03/2020 FINDINGS: CT HEAD FINDINGS Brain: Cerebral volume within normal limits. No acute intracranial hemorrhage. No acute large vessel territory infarct. No mass lesion or midline shift. No hydrocephalus or extra-axial fluid collection. Vascular: No abnormal hyperdense vessel. Skull: Scalp soft tissues demonstrate no acute finding. Calvarium intact. Sinuses/Orbits: Globes orbital soft tissues within normal limits. Paranasal sinuses and mastoid air cells are clear. Other: None. Review of the MIP images confirms the above findings CTA NECK FINDINGS Aortic arch: Right-sided arch normal in caliber with standard 3 vessel morphology. Aortic atherosclerosis. No significant stenosis about the origin the great vessels. Right carotid system: Right common and internal carotid arteries are patent without dissection. Mild atheromatous change about the right carotid bulb without hemodynamically significant greater than 50% stenosis. Left carotid system: Left common and internal carotid arteries are patent without dissection. Vascular stent traverses the left carotid bulb. Patent flow through the stent. Focal narrowing at the mid aspect of the stent with up to approximate 50% stenosis by NASCET criteria. Vertebral arteries: Both vertebral arteries arise from the subclavian arteries. Atheromatous plaque at the origin of the left vertebral artery with moderate stenosis. Vertebral arteries patent distally without stenosis or dissection. Skeleton: No discrete or worrisome osseous lesions. Other neck: No other acute finding. Upper chest: No other acute finding. Review of the MIP images confirms the above findings CTA HEAD FINDINGS Anterior circulation: Both internal carotid arteries are patent to the termini without stenosis or other abnormality. A1 segments patent bilaterally. Normal anterior communicating artery complex. Anterior cerebral arteries patent without significant stenosis. No M1 stenosis or occlusion. Distal MCA  branches perfused and symmetric. Posterior circulation: Both V4 segments patent without stenosis. Right vertebral artery slightly dominant. Both PICA patent. Basilar patent without stenosis. Superior cerebellar and posterior cerebral arteries patent bilaterally. Venous sinuses: Patent allowing for timing the contrast bolus. Anatomic variants: As above.  No aneurysm. Review of the MIP images confirms the above findings IMPRESSION: CT HEAD: Negative head CT.  No acute intracranial abnormality. CTA HEAD AND NECK: 1. Negative CTA for large vessel occlusion or other emergent finding. 2. Vascular stent traverses the left carotid bulb. Patent flow through the stent. Focal narrowing at the mid aspect of the stent with up to 50% stenosis by NASCET criteria. 3. Moderate stenosis at the origin of the left vertebral artery. 4.  Aortic Atherosclerosis (ICD10-I70.0). Electronically Signed   By: Virgia Griffins M.D.   On: 09/17/2023 23:07     Assessment and Plan:  HTN - Patient presented with high blood pressure and neck pain found to have elevated troponin and elevated blood pressure.  Recent stenting of the left carotid artery. - Patient reports he was not consistently taking his blood pressure medications due to low blood pressures after the procedure - PTA lisinopril  5 mg daily - In the ER blood pressure 177/100 - PTA lisinopril  5 mg daily restarted - Blood pressures overall look normal  Elevated troponin - Chest pain and shortness of breath reported at initial cardiology visit 08/29/23. Cardiac CTA was  ordered but not performed (due to insurance denial) and subsequent Myoview Lexiscan was ordered - High-sensitivity troponin 30>347. Continue to trend troponin - no chest pain reported, but had neck pain - continue IV heparin  - recent echo showed normal LVEF - continue ASA 81mg  daily, Crestor  40mg  daily - definitely elevated troponin can be from high blood pressure, suspect degree of CAD given PAD.  Although he did not have overt chest pain, but more atypical with neck pain, I think patient would benefit from [facility] NPO at midnight.   Neck pain S/p left carotid stent - CT of the head and neck showed patent blood flow through the stent with stent focal narrowing up to 50% - Vascular surgery recommended outpatient follow-up - continue ASA, Plavix  and Crestor   HLD - LDL 126, TG 296, total chol 242 - continue Crestor      For questions or updates, please contact [facility] Please consult [email or web address] for contact info under    Signed, Birney Belshe Rebekah Canada, PA-C  09/18/2023 9:49 AM

## 2023-09-18 NOTE — ED Notes (Signed)
 FALL BUNDLE CURRENTLY  IN PLACE

## 2023-09-18 NOTE — TOC CM/SW Note (Signed)
 Transition of Care [facility]) - Inpatient Brief Assessment   Patient Details  Name: Juan Salas MRN: [id number] Date of Birth: 1960-09-01  Transition of Care [facility]) CM/SW Contact:    Loman Risk, RN Phone Number: 09/18/2023, 12:06 PM   Clinical Narrative:  Transition of Care (TOC) Screening Note   Patient Details  Name: Juan Salas Date of Birth: [birth year]   Transition of Care [facility]) CM/SW Contact:    Loman Risk, RN Phone Number: 09/18/2023, 12:06 PM    Transition of Care Department [facility]) has reviewed patient and no TOC needs have been identified at this time.  If new patient transition needs arise, please place a TOC consult.     Transition of Care Asessment: Insurance and Status: Insurance coverage has been reviewed Patient has primary care physician: Yes     Prior/Current Home Services: No current home services Social Drivers of Health Review: SDOH reviewed no interventions necessary Readmission risk has been reviewed: Yes Transition of care needs: no transition of care needs at this time

## 2023-09-18 NOTE — ED Provider Notes (Signed)
 1:22 AM  Assumed care at shift change.  Patient here with recent left carotid artery stent placement on aspirin , Plavix  who presents to the emergency department with throbbing left-sided neck pain, hypertension with systolic blood pressure in the 230s.  Pain has improved, blood pressure currently 117/66.  No chest pain or shortness of breath.  First troponin was minimally elevated at 34.  Repeat was pending at time of signout has come back elevated at 347.  Concern for hypertensive emergency versus NSTEMI.  Will give aspirin , nitroglycerin, start heparin .  Will discuss with hospitalist for admission.   CRITICAL CARE Performed by: Verneda Golder   Total critical care time: 30 minutes  Critical care time was exclusive of separately billable procedures and treating other patients.  Critical care was necessary to treat or prevent imminent or life-threatening deterioration.  Critical care was time spent personally by me on the following activities: development of treatment plan with patient and/or surrogate as well as nursing, discussions with consultants, evaluation of patient's response to treatment, examination of patient, obtaining history from patient or surrogate, ordering and performing treatments and interventions, ordering and review of laboratory studies, ordering and review of radiographic studies, pulse oximetry and re-evaluation of patient's condition.    Sreeja Spies, Clover Dao, DO 09/18/23 [phone]

## 2023-09-18 NOTE — Progress Notes (Signed)
 ANTICOAGULATION CONSULT NOTE  Pharmacy Consult for heparin  infusion Indication: ACS/STEMI  Allergies  Allergen Reactions   Atorvastatin  Other (See Comments)    Joint pain Joint pain    Prednisone Other (See Comments)    After 3 days agitation and sleeplessness After 3 days agitation and sleeplessness    Verapamil  Nausea Only    Other reaction(s): Headache insomnia insomnia    Metoprolol  Palpitations    Per patient, can't tolerate. Per patient, can't tolerate.    Seasonal Ic [Cholestatin] Other (See Comments)    Sinus issues   Sulfa  Antibiotics Rash    Patient Measurements: Height: 6' (182.9 cm) Weight: 84.3 kg (185 lb 13.6 oz) IBW/kg (Calculated) : 77.6 HEPARIN  DW (KG): 84.3 Heparin  Dosing Weight: 86.9 kg  Vital Signs: Temp: 98.2 F (36.8 C) (06/01 1620) Temp Source: Oral (06/01 1620) BP: 115/68 (06/01 1620) Pulse Rate: 58 (06/01 1620)  Labs: Recent Labs    09/17/23 2040 09/18/23 0004 09/18/23 0151 09/18/23 0905 09/18/23 1657  HGB 12.4*  --   --   --   --   HCT 36.7*  --   --   --   --   PLT 267  --   --   --   --   APTT  --   --  28  --   --   LABPROT  --   --  13.3  --   --   INR  --   --  1.0  --   --   HEPARINUNFRC  --   --   --  0.78* 0.52  CREATININE 0.99  --   --   --   --   CKTOTAL  --   --   --  170  --   TROPONINIHS 34* 347*  --  351*  --     Estimated Creatinine Clearance: 82.7 mL/min (by C-G formula based on SCr of 0.99 mg/dL).   Medical History: Past Medical History:  Diagnosis Date   Fibromyalgia    GAD (generalized anxiety disorder)    GERD without esophagitis 05/26/2023   History of diverticulitis 05/26/2023   History of kidney stones 1977, 1983,2000   passed on his own. 7 different cycles of stones during youth.   Hyperlipidemia    Left carotid artery stenosis 05/26/2023   Migraine headache    Nocturia associated with benign prostatic hyperplasia 12/2017   Spinal stenosis     Assessment: Pt is a 63 yo male w/ recent  hx of left carotid artery stent, presenting to ED because of concerns for high blood pressure and left neck pain, found with elevated troponin I level, trending up.  Goal of Therapy:  Heparin  level 0.3-0.7 units/ml Monitor platelets by anticoagulation protocol: Yes  6/1 0905 HL 0.78, SUPRAtherapeutic 6/1 1657 HL 0.52, therapeutic x 1   Plan:  Heparin  level therapeutic x 1 Continue heparin  infusion at 1050 units/hr Recheck HL in 6 hours to confirm CBC daily while on heparin   Ramonita Burow, PharmD Clinical Pharmacist 09/18/2023 5:27 PM

## 2023-09-19 ENCOUNTER — Encounter: Payer: Self-pay | Admitting: Cardiovascular Disease

## 2023-09-19 ENCOUNTER — Encounter
Admission: EM | Disposition: A | Discharge status: Home / Self Care | Payer: Self-pay | Source: Home / Self Care | Attending: Emergency Medicine

## 2023-09-19 ENCOUNTER — Observation Stay: Admitting: Certified Registered Nurse Anesthetist

## 2023-09-19 DIAGNOSIS — I2489 Other forms of acute ischemic heart disease: Secondary | ICD-10-CM | POA: Diagnosis not present

## 2023-09-19 DIAGNOSIS — I2510 Atherosclerotic heart disease of native coronary artery without angina pectoris: Secondary | ICD-10-CM

## 2023-09-19 DIAGNOSIS — I160 Hypertensive urgency: Secondary | ICD-10-CM | POA: Diagnosis not present

## 2023-09-19 DIAGNOSIS — I214 Non-ST elevation (NSTEMI) myocardial infarction: Secondary | ICD-10-CM | POA: Diagnosis not present

## 2023-09-19 HISTORY — PX: LEFT HEART CATH AND CORONARY ANGIOGRAPHY: CATH118249

## 2023-09-19 LAB — CBC
HCT: 36.6 % — ABNORMAL LOW (ref 39.0–52.0)
Hemoglobin: 12.1 g/dL — ABNORMAL LOW (ref 13.0–17.0)
MCH: 30.9 pg (ref 26.0–34.0)
MCHC: 33.1 g/dL (ref 30.0–36.0)
MCV: 93.6 fL (ref 80.0–100.0)
Platelets: 261 10*3/uL (ref 150–400)
RBC: 3.91 MIL/uL — ABNORMAL LOW (ref 4.22–5.81)
RDW: 13.4 % (ref 11.5–15.5)
WBC: 5.4 10*3/uL (ref 4.0–10.5)
nRBC: 0 % (ref 0.0–0.2)

## 2023-09-19 LAB — BASIC METABOLIC PANEL WITH GFR
Anion gap: 6 (ref 5–15)
BUN: 18 mg/dL (ref 8–23)
CO2: 25 mmol/L (ref 22–32)
Calcium: 9.2 mg/dL (ref 8.9–10.3)
Chloride: 106 mmol/L (ref 98–111)
Creatinine, Ser: 1.08 mg/dL (ref 0.61–1.24)
GFR, Estimated: >60 mL/min (ref >60)
Glucose, Bld: 97 mg/dL (ref 70–99)
Potassium: 4 mmol/L (ref 3.5–5.1)
Sodium: 137 mmol/L (ref 135–145)

## 2023-09-19 LAB — HEPARIN LEVEL (UNFRACTIONATED): Heparin Unfractionated: 0.62 [IU]/mL (ref 0.30–0.70)

## 2023-09-19 SURGERY — LEFT HEART CATH AND CORONARY ANGIOGRAPHY
Anesthesia: Moderate Sedation

## 2023-09-19 MED ORDER — SODIUM CHLORIDE 0.9% FLUSH: 3.0000 mL | INTRAVENOUS | Status: DC | PRN

## 2023-09-19 MED ORDER — SODIUM CHLORIDE 0.9 % WEIGHT BASED INFUSION
3.0000 mL/kg/h | INTRAVENOUS | Status: DC
  Administered 2023-09-19: 3 mL/kg/h via INTRAVENOUS

## 2023-09-19 MED ORDER — FENTANYL CITRATE (PF) 100 MCG/2ML IJ SOLN
INTRAMUSCULAR | Status: AC
  Filled 2023-09-19: qty 2

## 2023-09-19 MED ORDER — VERAPAMIL HCL 2.5 MG/ML IV SOLN
INTRAVENOUS | Status: AC
  Filled 2023-09-19: qty 2

## 2023-09-19 MED ORDER — SODIUM CHLORIDE 0.9 % WEIGHT BASED INFUSION
1.0000 mL/kg/h | INTRAVENOUS | Status: DC
  Administered 2023-09-19: 1 mL/kg/h via INTRAVENOUS

## 2023-09-19 MED ORDER — SODIUM CHLORIDE 0.9 % IV SOLN: INTRAVENOUS | Status: AC

## 2023-09-19 MED ORDER — SODIUM CHLORIDE 0.9% FLUSH
3.0000 mL | Freq: Two times a day (BID) | INTRAVENOUS | Status: DC
  Administered 2023-09-19: 3 mL via INTRAVENOUS

## 2023-09-19 MED ORDER — HEPARIN (PORCINE) IN NACL 1000-0.9 UT/500ML-% IV SOLN
INTRAVENOUS | Status: AC
Start: 2023-09-19 — End: ?
  Filled 2023-09-19: qty 1000

## 2023-09-19 MED ORDER — LIDOCAINE HCL 1 % IJ SOLN
INTRAMUSCULAR | Status: AC
  Filled 2023-09-19: qty 20

## 2023-09-19 MED ORDER — IOHEXOL 300 MG/ML  SOLN
INTRAMUSCULAR | Status: DC | PRN
  Administered 2023-09-19: 50 mL

## 2023-09-19 MED ORDER — HEPARIN SODIUM (PORCINE) 1000 UNIT/ML IJ SOLN
INTRAMUSCULAR | Status: AC
  Filled 2023-09-19: qty 10

## 2023-09-19 MED ORDER — MIDAZOLAM HCL 2 MG/2ML IJ SOLN
INTRAMUSCULAR | Status: DC | PRN
Start: 2023-09-19 — End: 2023-09-19
  Administered 2023-09-19: 1 mg via INTRAVENOUS

## 2023-09-19 MED ORDER — FENTANYL CITRATE (PF) 100 MCG/2ML IJ SOLN
INTRAMUSCULAR | Status: DC | PRN
  Administered 2023-09-19: 25 ug via INTRAVENOUS

## 2023-09-19 MED ORDER — ASPIRIN 81 MG PO CHEW
CHEWABLE_TABLET | ORAL | Status: AC
  Filled 2023-09-19: qty 1

## 2023-09-19 MED ORDER — HEPARIN (PORCINE) IN NACL 1000-0.9 UT/500ML-% IV SOLN
INTRAVENOUS | Status: DC | PRN
  Administered 2023-09-19: 1000 mL

## 2023-09-19 MED ORDER — ASPIRIN 81 MG PO CHEW
81.0000 mg | CHEWABLE_TABLET | ORAL | Status: AC
  Administered 2023-09-19: 81 mg via ORAL

## 2023-09-19 MED ORDER — MIDAZOLAM HCL 2 MG/2ML IJ SOLN
INTRAMUSCULAR | Status: AC
  Filled 2023-09-19: qty 2

## 2023-09-19 MED ORDER — LIDOCAINE HCL (PF) 1 % IJ SOLN
INTRAMUSCULAR | Status: DC | PRN
Start: 2023-09-19 — End: 2023-09-19
  Administered 2023-09-19: 2 mL

## 2023-09-19 MED ORDER — VERAPAMIL HCL 2.5 MG/ML IV SOLN
INTRAVENOUS | Status: DC | PRN
  Administered 2023-09-19: 2.5 mg via INTRA_ARTERIAL

## 2023-09-19 MED ORDER — HEPARIN SODIUM (PORCINE) 1000 UNIT/ML IJ SOLN
INTRAMUSCULAR | Status: DC | PRN
Start: 2023-09-19 — End: 2023-09-19
  Administered 2023-09-19: 4000 [IU] via INTRAVENOUS

## 2023-09-19 MED ORDER — LISINOPRIL 10 MG PO TABS
ORAL_TABLET | ORAL | Status: DC
Start: 2023-09-19 — End: 2023-09-19
  Filled 2023-09-19: qty 1

## 2023-09-19 MED ORDER — SODIUM CHLORIDE 0.9 % IV SOLN: 250.0000 mL | INTRAVENOUS | Status: DC | PRN

## 2023-09-19 SURGICAL SUPPLY — 11 items
CATH INFINITI AMBI 5FR JK (CATHETERS) IMPLANT
DEVICE RAD TR BAND REGULAR (VASCULAR PRODUCTS) IMPLANT
DRAPE BRACHIAL (DRAPES) IMPLANT
GLIDESHEATH SLEND SS 6F .021 (SHEATH) IMPLANT
GUIDEWIRE INQWIRE 1.5J.035X260 (WIRE) IMPLANT
KIT ENCORE 26 ADVANTAGE (KITS) IMPLANT
KIT SYRINGE INJ CVI SPIKEX1 (MISCELLANEOUS) IMPLANT
PACK CARDIAC CATH (CUSTOM PROCEDURE TRAY) ×1 IMPLANT
SET ATX-X65L (MISCELLANEOUS) IMPLANT
STATION PROTECTION PRESSURIZED (MISCELLANEOUS) IMPLANT
TUBING CIL FLEX 10 FLL-RA (TUBING) IMPLANT

## 2023-09-19 NOTE — Progress Notes (Signed)
 ANTICOAGULATION CONSULT NOTE  Pharmacy Consult for heparin  infusion Indication: ACS/STEMI  Allergies  Allergen Reactions   Atorvastatin  Other (See Comments)    Joint pain Joint pain    Prednisone Other (See Comments)    After 3 days agitation and sleeplessness After 3 days agitation and sleeplessness    Verapamil  Nausea Only    Other reaction(s): Headache insomnia insomnia    Metoprolol  Palpitations    Per patient, can't tolerate. Per patient, can't tolerate.    Seasonal Ic [Cholestatin] Other (See Comments)    Sinus issues   Sulfa  Antibiotics Rash    Patient Measurements: Height: 6' (182.9 cm) Weight: 84.3 kg (185 lb 13.6 oz) IBW/kg (Calculated) : 77.6 HEPARIN  DW (KG): 84.3 Heparin  Dosing Weight: 86.9 kg  Vital Signs: Temp: 98 F (36.7 C) (06/02 0445) Temp Source: Oral (06/02 0445) BP: 111/63 (06/02 0445) Pulse Rate: 56 (06/02 0445)  Labs: Recent Labs    09/17/23 2040 09/18/23 0004 09/18/23 0151 09/18/23 0905 09/18/23 0905 09/18/23 1657 09/18/23 2257 09/19/23 0402  HGB 12.4*  --   --   --   --   --   --  12.1*  HCT 36.7*  --   --   --   --   --   --  36.6*  PLT 267  --   --   --   --   --   --  261  APTT  --   --  28  --   --   --   --   --   LABPROT  --   --  13.3  --   --   --   --   --   INR  --   --  1.0  --   --   --   --   --   HEPARINUNFRC  --   --   --  0.78*   < > 0.52 0.50 0.62  CREATININE 0.99  --   --   --   --   --   --  1.08  CKTOTAL  --   --   --  170  --   --   --   --   TROPONINIHS 34* 347*  --  351*  --   --   --   --    < > = values in this interval not displayed.    Estimated Creatinine Clearance: 75.8 mL/min (by C-G formula based on SCr of 1.08 mg/dL).   Medical History: Past Medical History:  Diagnosis Date   Fibromyalgia    GAD (generalized anxiety disorder)    GERD without esophagitis 05/26/2023   History of diverticulitis 05/26/2023   History of kidney stones 1977, 1983,2000   passed on his own. 7 different cycles  of stones during youth.   Hyperlipidemia    Left carotid artery stenosis 05/26/2023   Migraine headache    Nocturia associated with benign prostatic hyperplasia 12/2017   Spinal stenosis     Assessment: Pt is a 63 yo male w/ recent hx of left carotid artery stent, presenting to ED because of concerns for high blood pressure and left neck pain, found with elevated troponin I level, trending up.  Goal of Therapy:  Heparin  level 0.3-0.7 units/ml Monitor platelets by anticoagulation protocol: Yes  6/1 0905 HL 0.78, SUPRAtherapeutic 6/1 1657 HL 0.52, therapeutic x 1 6/1 2257 HL 0.50, therapeutic x 2 6/2 0402 HL 0.62, therapeutic x 3   Plan:  Heparin  level  therapeutic x 3 Continue heparin  infusion at 1050 units/hr Recheck HL daily w/ AM labs while therapeutic CBC daily while on heparin   Coretta Dexter, PharmD, [facility] 09/19/2023 5:42 AM

## 2023-09-19 NOTE — Discharge Summary (Signed)
 Physician Discharge Summary  Juan Salas GNF:[id number] DOB: September 24, 1960 DOA: 09/17/2023  PCP: Scherrie Curt, MD  Admit date: 09/17/2023 Discharge date: 09/19/2023  Admitted From: Home Disposition:  Home  Recommendations for Outpatient Follow-up:  Follow up with PCP in 1-2 weeks Follow up with cardiology 1-2 weeks  Home Health:No  Equipment/Devices:None   Discharge Condition:Stable  CODE STATUS:FULL  Diet recommendation: Heart healthy  Brief/Interim Summary: 63 y.o. male with medical history significant of carotic stenosis status post left-sided carotid stenting recently, HTN, HLD, mild intermittent asthma, presented with new onset of left-sided neck pain.   Patient has a strong family history of carotid stenosis, during a recent screening test it was found more than 95% stenosis of left carotid, for which patient underwent stenting of left carotid on 09/08/23.  Patient tolerated procedure well and went home.  Yesterday evening, around 5 PM, patient started to have throbbing-like left-sided neck pain, 10/10, constant but denied any chest pain shortness of breath headache.  He checked his blood pressure SBP more than 200 compared to baseline 120-130 after the carotid stenting.  And decided to come to ED.  Left-sided neck pain subsided between 830-9 PM. 2 weeks ago he had an episode of chest pain and general weakness come to ED, cardiac workup including echocardiogram and troponin EKG were normal and patient was reassured and sent home.  6/2: Left heart cath, clean coronaries.  BP stable on home lisinopril .  Cleared for dc home.  FU OP cardiology and PCP   Discharge Diagnoses:  Principal Problem:   Hypertensive urgency Active Problems:   NSTEMI (non-ST elevated myocardial infarction) (HCC)  NSTEMI Ruled OUT Elevated troponin Suspect supply demand mismatch 2/2 hypertensive urgency.  Clean cath.  Discharge home, resume home lisinopril   Discharge Instructions  Discharge  Instructions     Diet - low sodium heart healthy   Complete by: As directed    Increase activity slowly   Complete by: As directed       Allergies as of 09/19/2023       Reactions   Atorvastatin  Other (See Comments)   Joint pain Joint pain   Prednisone Other (See Comments)   After 3 days agitation and sleeplessness After 3 days agitation and sleeplessness   Verapamil  Nausea Only   Other reaction(s): Headache insomnia insomnia   Metoprolol  Palpitations   Per patient, can't tolerate. Per patient, can't tolerate.   Seasonal Ic [cholestatin] Other (See Comments)   Sinus issues   Sulfa  Antibiotics Rash        Medication List     STOP taking these medications    metoprolol  tartrate 100 MG tablet Commonly known as: LOPRESSOR        TAKE these medications    acetaminophen  500 MG tablet Commonly known as: TYLENOL  Take 500 mg by mouth every 4 (four) hours as needed for moderate pain or headache.   albuterol  108 (90 Base) MCG/ACT inhaler Commonly known as: VENTOLIN  HFA Inhale 2 puffs into the lungs every 6 (six) hours as needed for wheezing or shortness of breath.   aspirin  EC 81 MG tablet Take 81 mg by mouth daily. Swallow whole.   calcium  carbonate 500 MG chewable tablet Commonly known as: TUMS - dosed in mg elemental calcium  Chew 1 tablet by mouth 2 (two) times daily as needed for indigestion or heartburn.   clopidogrel  75 MG tablet Commonly known as: PLAVIX  Take 1 tablet (75 mg total) by mouth daily.   cyanocobalamin  2000 MCG tablet Take 2,000 mcg by  mouth daily.   Fish Oil 1000 MG Caps Take 1,000 mg by mouth daily.   L-THEANINE PO Take 200 mg by mouth daily as needed (stress).   lisinopril  5 MG tablet Commonly known as: ZESTRIL  Take 5 mg by mouth at bedtime.   Magnesium  250 MG Tabs Take 250 mg by mouth daily as needed.   montelukast  10 MG tablet Commonly known as: SINGULAIR  Take 1 tablet (10 mg total) by mouth at bedtime.   multivitamin with  minerals Tabs tablet Take 1 tablet by mouth daily.   naproxen 250 MG tablet Commonly known as: NAPROSYN Take 250 mg by mouth as needed.   pantoprazole  40 MG tablet Commonly known as: PROTONIX  Take 40 mg by mouth daily.   rosuvastatin  40 MG tablet Commonly known as: CRESTOR  Take 1 tablet (40 mg total) by mouth every morning.   tadalafil 20 MG tablet Commonly known as: CIALIS Take 20 mg by mouth daily as needed.   Vitamin D3 50 MCG (2000 UT) Tabs Take 1 tablet by mouth daily at 6 (six) AM.        Follow-up Information     Copland, Jolena Nay, MD.   Specialties: Family Medicine, Sports Medicine Contact information: [address] [place] [address] [phone]         [employer], MD.   Specialties: Vascular Surgery, Radiology, Interventional Cardiology Contact information: [address] [place] [address] [phone]                Allergies  Allergen Reactions   Atorvastatin  Other (See Comments)    Joint pain Joint pain    Prednisone Other (See Comments)    After 3 days agitation and sleeplessness After 3 days agitation and sleeplessness    Verapamil  Nausea Only    Other reaction(s): Headache insomnia insomnia    Metoprolol  Palpitations    Per patient, can't tolerate. Per patient, can't tolerate.    Seasonal Ic [Cholestatin] Other (See Comments)    Sinus issues   Sulfa  Antibiotics Rash    Consultations: Cardiology   Procedures/Studies: CARDIAC CATHETERIZATION Result Date: 09/19/2023   Ost LM lesion is 30% stenosed.   1st Diag lesion is 40% stenosed.   The left ventricular systolic function is normal.   LV end diastolic pressure is normal.   The left ventricular ejection fraction is 55-65% by visual estimate. 1.  Mild to moderate nonobstructive coronary artery disease. 2.  Normal LV systolic function and high normal left ventricular end-diastolic pressure. Recommendations: Elevated troponin is likely due to  supply demand mismatch.  Recommend medical therapy.  I discontinued heparin  drip.   CT ANGIO HEAD NECK W WO CM Result Date: 09/17/2023 CLINICAL DATA:  Initial evaluation for acute left neck pain. EXAM: CT ANGIOGRAPHY HEAD AND NECK WITH AND WITHOUT CONTRAST TECHNIQUE: Multidetector CT imaging of the head and neck was performed using the standard protocol during bolus administration of intravenous contrast. Multiplanar CT image reconstructions and MIPs were obtained to evaluate the vascular anatomy. Carotid stenosis measurements (when applicable) are obtained utilizing NASCET criteria, using the distal internal carotid diameter as the denominator. RADIATION DOSE REDUCTION: This exam was performed according to the departmental dose-optimization program which includes automated exposure control, adjustment of the mA and/or kV according to patient size and/or use of iterative reconstruction technique. CONTRAST:  75mL OMNIPAQUE  IOHEXOL  350 MG/ML SOLN COMPARISON:  MRI from 12/03/2020 FINDINGS: CT HEAD FINDINGS Brain: Cerebral volume within normal limits. No acute intracranial hemorrhage. No acute  large vessel territory infarct. No mass lesion or midline shift. No hydrocephalus or extra-axial fluid collection. Vascular: No abnormal hyperdense vessel. Skull: Scalp soft tissues demonstrate no acute finding. Calvarium intact. Sinuses/Orbits: Globes orbital soft tissues within normal limits. Paranasal sinuses and mastoid air cells are clear. Other: None. Review of the MIP images confirms the above findings CTA NECK FINDINGS Aortic arch: Right-sided arch normal in caliber with standard 3 vessel morphology. Aortic atherosclerosis. No significant stenosis about the origin the great vessels. Right carotid system: Right common and internal carotid arteries are patent without dissection. Mild atheromatous change about the right carotid bulb without hemodynamically significant greater than 50% stenosis. Left carotid system: Left  common and internal carotid arteries are patent without dissection. Vascular stent traverses the left carotid bulb. Patent flow through the stent. Focal narrowing at the mid aspect of the stent with up to approximate 50% stenosis by NASCET criteria. Vertebral arteries: Both vertebral arteries arise from the subclavian arteries. Atheromatous plaque at the origin of the left vertebral artery with moderate stenosis. Vertebral arteries patent distally without stenosis or dissection. Skeleton: No discrete or worrisome osseous lesions. Other neck: No other acute finding. Upper chest: No other acute finding. Review of the MIP images confirms the above findings CTA HEAD FINDINGS Anterior circulation: Both internal carotid arteries are patent to the termini without stenosis or other abnormality. A1 segments patent bilaterally. Normal anterior communicating artery complex. Anterior cerebral arteries patent without significant stenosis. No M1 stenosis or occlusion. Distal MCA branches perfused and symmetric. Posterior circulation: Both V4 segments patent without stenosis. Right vertebral artery slightly dominant. Both PICA patent. Basilar patent without stenosis. Superior cerebellar and posterior cerebral arteries patent bilaterally. Venous sinuses: Patent allowing for timing the contrast bolus. Anatomic variants: As above.  No aneurysm. Review of the MIP images confirms the above findings IMPRESSION: CT HEAD: Negative head CT.  No acute intracranial abnormality. CTA HEAD AND NECK: 1. Negative CTA for large vessel occlusion or other emergent finding. 2. Vascular stent traverses the left carotid bulb. Patent flow through the stent. Focal narrowing at the mid aspect of the stent with up to 50% stenosis by NASCET criteria. 3. Moderate stenosis at the origin of the left vertebral artery. 4.  Aortic Atherosclerosis (ICD10-I70.0). Electronically Signed   By: Virgia Griffins M.D.   On: 09/17/2023 23:07   PERIPHERAL VASCULAR  CATHETERIZATION Result Date: 09/08/2023 See surgical note for result.  ECHOCARDIOGRAM COMPLETE Result Date: 09/06/2023    ECHOCARDIOGRAM REPORT   Patient Name:   Juan Salas Date of Exam: 09/06/2023 Medical Rec #:  [id number]          Height:       72.0 in Accession #:    [id number]         Weight:       190.8 lb Date of Birth:  [birth year]          BSA:          2.088 m Patient Age:    63 years           BP:           126/76 mmHg Patient Gender: M                  HR:           76 bpm. Exam Location:  [facility] Procedure: 2D Echo, Cardiac Doppler and Color Doppler (Both Spectral and Color  Flow Doppler were utilized during procedure). Indications:    R07.2 Precordial pain  History:        Patient has no prior history of Echocardiogram examinations.                 Risk Factors:Hypertension and Dyslipidemia.  Sonographer:    Alicia Inoue Referring Phys: [id number] BRIAN AGBOR-ETANG IMPRESSIONS  1. Left ventricular ejection fraction, by estimation, is 60 to 65%. The left ventricle has normal function. The left ventricle has no regional wall motion abnormalities. Left ventricular diastolic parameters were normal.  2. Right ventricular systolic function is normal. The right ventricular size is normal. Tricuspid regurgitation signal is inadequate for assessing PA pressure.  3. The mitral valve is normal in structure. No evidence of mitral valve regurgitation. No evidence of mitral stenosis.  4. The aortic valve is normal in structure. Aortic valve regurgitation is not visualized. No aortic stenosis is present.  5. The inferior vena cava is normal in size with greater than 50% respiratory variability, suggesting right atrial pressure of 3 mmHg. FINDINGS  Left Ventricle: Left ventricular ejection fraction, by estimation, is 60 to 65%. The left ventricle has normal function. The left ventricle has no regional wall motion abnormalities. Strain was performed and the global longitudinal strain is  indeterminate. The left ventricular internal cavity size was normal in size. There is no left ventricular hypertrophy. Left ventricular diastolic parameters were normal. Right Ventricle: The right ventricular size is normal. No increase in right ventricular wall thickness. Right ventricular systolic function is normal. Tricuspid regurgitation signal is inadequate for assessing PA pressure. Left Atrium: Left atrial size was normal in size. Right Atrium: Right atrial size was normal in size. Pericardium: There is no evidence of pericardial effusion. Mitral Valve: The mitral valve is normal in structure. No evidence of mitral valve regurgitation. No evidence of mitral valve stenosis. Tricuspid Valve: The tricuspid valve is normal in structure. Tricuspid valve regurgitation is not demonstrated. No evidence of tricuspid stenosis. Aortic Valve: The aortic valve is normal in structure. Aortic valve regurgitation is not visualized. No aortic stenosis is present. Aortic valve mean gradient measures 4.0 mmHg. Aortic valve peak gradient measures 7.3 mmHg. Aortic valve area, by VTI measures 3.15 cm. Pulmonic Valve: The pulmonic valve was normal in structure. Pulmonic valve regurgitation is not visualized. No evidence of pulmonic stenosis. Aorta: The aortic root is normal in size and structure. Venous: The inferior vena cava is normal in size with greater than 50% respiratory variability, suggesting right atrial pressure of 3 mmHg. IAS/Shunts: No atrial level shunt detected by color flow Doppler. Additional Comments: 3D was performed not requiring image post processing on an independent workstation and was indeterminate.  LEFT VENTRICLE PLAX 2D LVIDd:         5.40 cm      Diastology LVIDs:         3.80 cm      LV e' medial:    9.14 cm/s LV PW:         0.90 cm      LV E/e' medial:  8.9 LV IVS:        0.80 cm      LV e' lateral:   7.94 cm/s LVOT diam:     2.30 cm      LV E/e' lateral: 10.3 LV SV:         89 LV SV Index:   43 LVOT  Area:     4.15 cm  LV Volumes (MOD)  LV vol d, MOD A2C: 84.8 ml LV vol d, MOD A4C: 102.0 ml LV vol s, MOD A2C: 31.1 ml LV vol s, MOD A4C: 43.6 ml LV SV MOD A2C:     53.7 ml LV SV MOD A4C:     102.0 ml LV SV MOD BP:      58.1 ml RIGHT VENTRICLE             IVC RV Basal diam:  3.90 cm     IVC diam: 1.70 cm RV Mid diam:    2.80 cm RV S prime:     17.00 cm/s TAPSE (M-mode): 2.9 cm LEFT ATRIUM              Index        RIGHT ATRIUM           Index LA Vol (A2C):   58.0 ml  27.77 ml/m  RA Area:     19.00 cm LA Vol (A4C):   113.0 ml 54.11 ml/m  RA Volume:   56.10 ml  26.86 ml/m LA Biplane Vol: 83.2 ml  39.84 ml/m  AORTIC VALVE AV Area (Vmax):    3.32 cm AV Area (Vmean):   3.18 cm AV Area (VTI):     3.15 cm AV Vmax:           135.00 cm/s AV Vmean:          87.700 cm/s AV VTI:            0.282 m AV Peak Grad:      7.3 mmHg AV Mean Grad:      4.0 mmHg LVOT Vmax:         108.00 cm/s LVOT Vmean:        67.100 cm/s LVOT VTI:          0.214 m LVOT/AV VTI ratio: 0.76  AORTA Ao Sinus diam: 3.50 cm Ao Asc diam:   3.20 cm MITRAL VALVE MV Area (PHT): 4.39 cm    SHUNTS MV Decel Time: 173 msec    Systemic VTI:  0.21 m MV E velocity: 81.80 cm/s  Systemic Diam: 2.30 cm MV A velocity: 69.70 cm/s MV E/A ratio:  1.17 Belva Boyden MD Electronically signed by Belva Boyden MD Signature Date/Time: 09/06/2023/7:09:02 PM    Final    CT Angio Chest PE W and/or Wo Contrast Result Date: 08/26/2023 CLINICAL DATA:  Pulmonary embolism (PE) suspected, high prob. Chest tightness and shortness of breath. EXAM: CT ANGIOGRAPHY CHEST WITH CONTRAST TECHNIQUE: Multidetector CT imaging of the chest was performed using the standard protocol during bolus administration of intravenous contrast. Multiplanar CT image reconstructions and MIPs were obtained to evaluate the vascular anatomy. RADIATION DOSE REDUCTION: This exam was performed according to the departmental dose-optimization program which includes automated exposure control, adjustment of the  mA and/or kV according to patient size and/or use of iterative reconstruction technique. CONTRAST:  75mL OMNIPAQUE  IOHEXOL  350 MG/ML SOLN COMPARISON:  None Available. FINDINGS: Cardiovascular: No evidence of embolism to the proximal subsegmental pulmonary artery level. Mild cardiomegaly. No pericardial effusion. No aortic aneurysm. There are coronary artery calcifications, in keeping with coronary artery disease. There are also mild peripheral atherosclerotic vascular calcifications of thoracic aorta and its major branches. Mediastinum/Nodes: Visualized thyroid  gland appears grossly unremarkable. No solid / cystic mediastinal masses. The esophagus is nondistended precluding optimal assessment. No axillary, mediastinal or hilar lymphadenopathy by size criteria. Lungs/Pleura: The central tracheo-bronchial tree is patent. There are dependent changes in bilateral lungs. No  mass or consolidation. No pleural effusion or pneumothorax. No suspicious lung nodules. There is a 2 mm probable calcified nodule in the lingular segment of left upper lobe (series 6, image 81). Upper Abdomen: Visualized upper abdominal viscera within normal limits. Musculoskeletal: The visualized soft tissues of the chest wall are grossly unremarkable. No suspicious osseous lesions. There are mild multilevel degenerative changes in the visualized spine. Review of the MIP images confirms the above findings. IMPRESSION: 1. No embolism to the proximal subsegmental pulmonary artery level. 2. No lung mass, consolidation, pleural effusion or pneumothorax. 3. Multiple other nonacute observations, as described above. Aortic Atherosclerosis (ICD10-I70.0). Electronically Signed   By: Beula Brunswick M.D.   On: 08/26/2023 14:44   DG Chest 2 View Result Date: 08/26/2023 CLINICAL DATA:  Shortness of breath EXAM: CHEST - 2 VIEW COMPARISON:  None Available. FINDINGS: The heart size and mediastinal contours are within normal limits. No consolidation, pneumothorax  or effusion. No edema. Degenerative changes seen along the spine. IMPRESSION: No acute cardiopulmonary disease. Electronically Signed   By: Adrianna Horde M.D.   On: 08/26/2023 13:05      Subjective: Seen and examined on the day of discharge.  Stable, BP controlled, CP free  Discharge Exam: Vitals:   09/19/23 1142 09/19/23 1222  BP: 113/81 131/78  Pulse: 77 68  Resp: 16   Temp: 98.2 F (36.8 C) 98.1 F (36.7 C)  SpO2: 99% 100%   Vitals:   09/19/23 1100 09/19/23 1115 09/19/23 1142 09/19/23 1222  BP: 114/65 101/69 113/81 131/78  Pulse: 64 61 77 68  Resp: 14 16 16    Temp:   98.2 F (36.8 C) 98.1 F (36.7 C)  TempSrc:   Oral   SpO2: 97% 97% 99% 100%  Weight:      Height:        General: Pt is alert, awake, not in acute distress Cardiovascular: RRR, S1/S2 +, no rubs, no gallops Respiratory: CTA bilaterally, no wheezing, no rhonchi Abdominal: Soft, NT, ND, bowel sounds + Extremities: no edema, no cyanosis    The results of significant diagnostics from this hospitalization (including imaging, microbiology, ancillary and laboratory) are listed below for reference.     Microbiology: No results found for this or any previous visit (from the past 240 hours).   Labs: BNP (last 3 results) No results for input(s): "BNP" in the last 8760 hours. Basic Metabolic Panel: Recent Labs  Lab 09/17/23 2040 09/19/23 0402  NA 134* 137  K 3.4* 4.0  CL 101 106  CO2 22 25  GLUCOSE 121* 97  BUN 16 18  CREATININE 0.99 1.08  CALCIUM  9.5 9.2   Liver Function Tests: No results for input(s): "AST", "ALT", "ALKPHOS", "BILITOT", "PROT", "ALBUMIN" in the last 168 hours. No results for input(s): "LIPASE", "AMYLASE" in the last 168 hours. No results for input(s): "AMMONIA" in the last 168 hours. CBC: Recent Labs  Lab 09/17/23 2040 09/19/23 0402  WBC 6.1 5.4  NEUTROABS 3.3  --   HGB 12.4* 12.1*  HCT 36.7* 36.6*  MCV 91.5 93.6  PLT 267 261   Cardiac Enzymes: Recent Labs  Lab  09/18/23 0905  CKTOTAL 170   BNP: Invalid input(s): "POCBNP" CBG: No results for input(s): "GLUCAP" in the last 168 hours. D-Dimer No results for input(s): "DDIMER" in the last 72 hours. Hgb A1c No results for input(s): "HGBA1C" in the last 72 hours. Lipid Profile No results for input(s): "CHOL", "HDL", "LDLCALC", "TRIG", "CHOLHDL", "LDLDIRECT" in the last 72 hours. Thyroid   function studies No results for input(s): "TSH", "T4TOTAL", "T3FREE", "THYROIDAB" in the last 72 hours.  Invalid input(s): "FREET3" Anemia work up No results for input(s): "VITAMINB12", "FOLATE", "FERRITIN", "TIBC", "IRON", "RETICCTPCT" in the last 72 hours. Urinalysis    Component Value Date/Time   COLORURINE AMBER (A) 02/05/2016 0829   APPEARANCEUR Clear 01/12/2018 1057   LABSPEC 1.011 02/05/2016 0829   PHURINE 7.0 02/05/2016 0829   GLUCOSEU Negative 01/12/2018 1057   HGBUR NEGATIVE 02/05/2016 0829   BILIRUBINUR Negative 01/12/2018 1057   KETONESUR 1+ (A) 02/05/2016 0829   PROTEINUR Negative 01/12/2018 1057   PROTEINUR 30 (A) 02/05/2016 0829   NITRITE Negative 01/12/2018 1057   NITRITE POSITIVE (A) 02/05/2016 0829   LEUKOCYTESUR Negative 01/12/2018 1057   Sepsis Labs Recent Labs  Lab 09/17/23 2040 09/19/23 0402  WBC 6.1 5.4   Microbiology No results found for this or any previous visit (from the past 240 hours).   Time coordinating discharge: Over 30 minutes  SIGNED:   Tiajuana Fluke, MD  [facility] 09/19/2023, 1:47 PM Pager   If 7PM-7AM, please contact night-coverage

## 2023-09-19 NOTE — Plan of Care (Signed)

## 2023-09-19 NOTE — Interval H&P Note (Signed)
 History and Physical Interval Note:  09/19/2023 8:51 AM  Juan Salas  has presented today for surgery, with the diagnosis of non ST elevation myocardial infarction.  The various methods of treatment have been discussed with the patient and family. After consideration of risks, benefits and other options for treatment, the patient has consented to  Procedure(s): LEFT HEART CATH AND CORONARY ANGIOGRAPHY (N/A) as a surgical intervention.  The patient's history has been reviewed, patient examined, no change in status, stable for surgery.  I have reviewed the patient's chart and labs.  Questions were answered to the patient's satisfaction.     Atticus Wedin

## 2023-09-19 NOTE — Progress Notes (Signed)
 Rounding Note   Patient Name: Juan Salas Date of Encounter: 09/19/2023  [facility] Cardiologist: Constancia Delton, MD   Subjective Cardiac catheterization showed mild to moderate nonobstructive coronary artery disease.  He feels back to baseline.  He was taken off lisinopril  after recent carotid stenting due to hypotension.  He presented with hypertensive urgency and mildly elevated troponin with minimal chest discomfort.  His blood pressure is back to normal now on lisinopril .  Scheduled Meds:  aspirin        aspirin  EC  81 mg Oral Daily   clopidogrel   75 mg Oral Daily   lisinopril        lisinopril   5 mg Oral Daily   montelukast   10 mg Oral QHS   pantoprazole   40 mg Oral Daily   rosuvastatin   40 mg Oral BH-q7a   sodium chloride  flush  3 mL Intravenous Q12H   Continuous Infusions:  sodium chloride  75 mL/hr at 09/19/23 0940   sodium chloride      PRN Meds: sodium chloride , acetaminophen  **OR** acetaminophen , albuterol , aspirin , lisinopril , magnesium  hydroxide, nitroGLYCERIN, ondansetron  **OR** ondansetron  (ZOFRAN ) IV, sodium chloride  flush, traZODone   Vital Signs  Vitals:   09/19/23 1045 09/19/23 1100 09/19/23 1115 09/19/23 1142  BP: 125/66 114/65 101/69 113/81  Pulse: 72 64 61 77  Resp: 16 14 16 16   Temp:    98.2 F (36.8 C)  TempSrc:    Oral  SpO2: 97% 97% 97% 99%  Weight:      Height:        Intake/Output Summary (Last 24 hours) at 09/19/2023 1213 Last data filed at 09/18/2023 2140 Gross per 24 hour  Intake 1031.6 ml  Output --  Net 1031.6 ml      09/19/2023    8:11 AM 09/18/2023    5:02 AM 09/08/2023    4:49 PM  Last 3 Weights  Weight (lbs) 185 lb 13.6 oz 185 lb 13.6 oz 191 lb 9.3 oz  Weight (kg) 84.3 kg 84.3 kg 86.9 kg      Telemetry Normal sinus rhythm- Personally Reviewed  ECG   - Personally Reviewed  Physical Exam  GEN: No acute distress.   Neck: No JVD Cardiac: RRR, no murmurs, rubs, or gallops.  Respiratory: Clear to  auscultation bilaterally. GI: Soft, nontender, non-distended  MS: No edema; No deformity. Neuro:  Nonfocal  Psych: Normal affect   Labs High Sensitivity Troponin:   Recent Labs  Lab 08/26/23 1154 08/26/23 1346 09/17/23 2040 09/18/23 0004 09/18/23 0905  TROPONINIHS 27* 27* 34* 347* 351*     Chemistry Recent Labs  Lab 09/17/23 2040 09/19/23 0402  NA 134* 137  K 3.4* 4.0  CL 101 106  CO2 22 25  GLUCOSE 121* 97  BUN 16 18  CREATININE 0.99 1.08  CALCIUM  9.5 9.2  GFRNONAA >60 >60  ANIONGAP 11 6    Lipids No results for input(s): "CHOL", "TRIG", "HDL", "LABVLDL", "LDLCALC", "CHOLHDL" in the last 168 hours.  Hematology Recent Labs  Lab 09/17/23 2040 09/19/23 0402  WBC 6.1 5.4  RBC 4.01* 3.91*  HGB 12.4* 12.1*  HCT 36.7* 36.6*  MCV 91.5 93.6  MCH 30.9 30.9  MCHC 33.8 33.1  RDW 12.7 13.4  PLT 267 261   Thyroid  No results for input(s): "TSH", "FREET4" in the last 168 hours.  BNPNo results for input(s): "BNP", "PROBNP" in the last 168 hours.  DDimer No results for input(s): "DDIMER" in the last 168 hours.   Radiology  CARDIAC CATHETERIZATION Result Date:  09/19/2023   Ost LM lesion is 30% stenosed.   1st Diag lesion is 40% stenosed.   The left ventricular systolic function is normal.   LV end diastolic pressure is normal.   The left ventricular ejection fraction is 55-65% by visual estimate. 1.  Mild to moderate nonobstructive coronary artery disease. 2.  Normal LV systolic function and high normal left ventricular end-diastolic pressure. Recommendations: Elevated troponin is likely due to supply demand mismatch.  Recommend medical therapy.  I discontinued heparin  drip.   CT ANGIO HEAD NECK W WO CM Result Date: 09/17/2023 CLINICAL DATA:  Initial evaluation for acute left neck pain. EXAM: CT ANGIOGRAPHY HEAD AND NECK WITH AND WITHOUT CONTRAST TECHNIQUE: Multidetector CT imaging of the head and neck was performed using the standard protocol during bolus administration of  intravenous contrast. Multiplanar CT image reconstructions and MIPs were obtained to evaluate the vascular anatomy. Carotid stenosis measurements (when applicable) are obtained utilizing NASCET criteria, using the distal internal carotid diameter as the denominator. RADIATION DOSE REDUCTION: This exam was performed according to the departmental dose-optimization program which includes automated exposure control, adjustment of the mA and/or kV according to patient size and/or use of iterative reconstruction technique. CONTRAST:  75mL OMNIPAQUE  IOHEXOL  350 MG/ML SOLN COMPARISON:  MRI from 12/03/2020 FINDINGS: CT HEAD FINDINGS Brain: Cerebral volume within normal limits. No acute intracranial hemorrhage. No acute large vessel territory infarct. No mass lesion or midline shift. No hydrocephalus or extra-axial fluid collection. Vascular: No abnormal hyperdense vessel. Skull: Scalp soft tissues demonstrate no acute finding. Calvarium intact. Sinuses/Orbits: Globes orbital soft tissues within normal limits. Paranasal sinuses and mastoid air cells are clear. Other: None. Review of the MIP images confirms the above findings CTA NECK FINDINGS Aortic arch: Right-sided arch normal in caliber with standard 3 vessel morphology. Aortic atherosclerosis. No significant stenosis about the origin the great vessels. Right carotid system: Right common and internal carotid arteries are patent without dissection. Mild atheromatous change about the right carotid bulb without hemodynamically significant greater than 50% stenosis. Left carotid system: Left common and internal carotid arteries are patent without dissection. Vascular stent traverses the left carotid bulb. Patent flow through the stent. Focal narrowing at the mid aspect of the stent with up to approximate 50% stenosis by NASCET criteria. Vertebral arteries: Both vertebral arteries arise from the subclavian arteries. Atheromatous plaque at the origin of the left vertebral artery  with moderate stenosis. Vertebral arteries patent distally without stenosis or dissection. Skeleton: No discrete or worrisome osseous lesions. Other neck: No other acute finding. Upper chest: No other acute finding. Review of the MIP images confirms the above findings CTA HEAD FINDINGS Anterior circulation: Both internal carotid arteries are patent to the termini without stenosis or other abnormality. A1 segments patent bilaterally. Normal anterior communicating artery complex. Anterior cerebral arteries patent without significant stenosis. No M1 stenosis or occlusion. Distal MCA branches perfused and symmetric. Posterior circulation: Both V4 segments patent without stenosis. Right vertebral artery slightly dominant. Both PICA patent. Basilar patent without stenosis. Superior cerebellar and posterior cerebral arteries patent bilaterally. Venous sinuses: Patent allowing for timing the contrast bolus. Anatomic variants: As above.  No aneurysm. Review of the MIP images confirms the above findings IMPRESSION: CT HEAD: Negative head CT.  No acute intracranial abnormality. CTA HEAD AND NECK: 1. Negative CTA for large vessel occlusion or other emergent finding. 2. Vascular stent traverses the left carotid bulb. Patent flow through the stent. Focal narrowing at the mid aspect of the stent with up  to 50% stenosis by NASCET criteria. 3. Moderate stenosis at the origin of the left vertebral artery. 4.  Aortic Atherosclerosis (ICD10-I70.0). Electronically Signed   By: Virgia Griffins M.D.   On: 09/17/2023 23:07    Cardiac Studies Cardiac catheterization done today showed mild to moderate nonobstructive coronary artery disease with normal ejection fraction  Patient Profile   63 y.o. male history of hyperlipidemia, essential hypertension and recent left carotid stenting who presented with hypertensive urgency and mildly elevated troponin.  Assessment & Plan  1.  Elevated troponin seems to be due to supply demand  mismatch.  Cardiac catheterization showed no evidence of acute culprit lesions.  He does have mild to moderate nonobstructive coronary artery disease for which I recommend aggressive medical therapy.  2.  Essential hypertension: Suspect fluctuations in blood pressure related to recent left carotid stenting.  I agree with resuming lisinopril .  If he has recurrent elevated blood pressure, workup for secondary hypertension should be considered.  3.  Left carotid stenting: Currently on dual antiplatelet therapy.  4.  Hyperlipidemia: Continue high-dose rosuvastatin  with a target LDL of less than 55.  The patient can be discharged home from a cardiac standpoint.  Will arrange follow-up in our office in 1 to 2 weeks.   For questions or updates, please contact [facility] Please consult [email or web address] for contact info under     Signed, Antionette Kirks, MD  09/19/2023, 12:13 PM

## 2023-09-20 ENCOUNTER — Ambulatory Visit: Attending: Cardiology | Admitting: Cardiology

## 2023-09-20 ENCOUNTER — Telehealth (INDEPENDENT_AMBULATORY_CARE_PROVIDER_SITE_OTHER): Payer: Self-pay

## 2023-09-20 ENCOUNTER — Telehealth: Payer: Self-pay | Admitting: Cardiology

## 2023-09-20 ENCOUNTER — Encounter: Payer: Self-pay | Admitting: Cardiology

## 2023-09-20 ENCOUNTER — Telehealth: Payer: Self-pay

## 2023-09-20 VITALS — BP 150/80 | HR 77 | Ht 72.0 in | Wt 186.2 lb

## 2023-09-20 DIAGNOSIS — I6522 Occlusion and stenosis of left carotid artery: Secondary | ICD-10-CM

## 2023-09-20 DIAGNOSIS — I10 Essential (primary) hypertension: Secondary | ICD-10-CM | POA: Diagnosis not present

## 2023-09-20 DIAGNOSIS — I2510 Atherosclerotic heart disease of native coronary artery without angina pectoris: Secondary | ICD-10-CM | POA: Diagnosis not present

## 2023-09-20 DIAGNOSIS — E782 Mixed hyperlipidemia: Secondary | ICD-10-CM

## 2023-09-20 MED ORDER — LISINOPRIL 5 MG PO TABS
5.0000 mg | ORAL_TABLET | Freq: Two times a day (BID) | ORAL | 3 refills | Status: DC
Start: 2023-09-20 — End: 2023-12-12

## 2023-09-20 MED ORDER — NITROGLYCERIN 0.4 MG SL SUBL: 0.4000 mg | SUBLINGUAL_TABLET | SUBLINGUAL | 3 refills | Status: AC | PRN

## 2023-09-20 NOTE — Progress Notes (Signed)
 Cardiology Office Note   Date:  09/20/2023  ID:  DRAGAN TAMBURRINO, DOB 1960/05/11, MRN [id number] PCP: Scherrie Curt, MD  [facility] Providers Cardiologist:  Constancia Delton, MD     History of Present Illness Juan Salas is a 63 y.o. male with a past medical history of hyperlipidemia, pulm hypertension, left carotid artery stenosis status post stent (09/08/2023), DVT, fibromyalgia, and coronary artery disease status post recent NSTEMI (09/18/2023), who is here today to follow-up status post hospitalization with elevated blood pressures.   He had previously been evaluated in cardiology clinic on 08/29/2023 as a new patient for shortness of breath and chest tightness.  He saw vascular surgery for left carotid artery stenosis of 95% on neck CTA.  Echocardiogram revealed LVEF 60 to 65%, no RWMA, and no valvular disease.  Cardiac CT was not performed due to insurance denial, Agapito Alcide was ordered but had not been performed prior to hospitalization.  He underwent left carotid stent placement on 09/08/2023 by Dr. Vonna Guardian.  He presented to the [facility] emergency department on 09/18/2023 with elevated blood pressure 177/90, pulse 94, respiration 20, 100% oxygen saturations on room air.  Pertinent labs revealed sodium 134, potassium 3.4, hemoglobin 12.5.  EKG showed normal sinus rhythm with rate 95 bpm with no significant changes.  CT of the head was negative.  CT of the head and neck was negative for large vessel occlusion or other emergent finding, flow through the stent with focal narrowing at the mid aspect of [place] diffuse 50% stenosis.  Vascular surgery was consulted who reported nothing to do at this time outpatient follow-up is recommended.  High-sensitivity troponin went from 30-3 47 and he was started on IV heparin  infusion and admitted to the hospital.  On 09/19/2023 he underwent left heart catheterization which revealed mild to moderate nonobstructive coronary artery disease, normal LV  systolic function with normal left ventricular end-diastolic pressures.  Elevated troponin was likely due to supply/demand mismatch.  Recommended medical therapy and heparin  drip was discontinued.  Vitals were considered stable and he was able to be discharged after his catheterization on 09/19/2023.  He was continued on aspirin  81 mg daily, clopidogrel  75 mg daily, lisinopril  5 mg daily and rosuvastatin  40 mg daily.Juan Salas   He called into the nurse triage line with elevated blood pressures 175/96, 160/95, 140/84 all of this morning.  He had a bad headache that started in the back of his head, extreme shaking in his body, throbbing.  He had a stent implanted 2 weeks ago.  Blood pressure was spiking and he would like to be seen early.  He was added onto the schedule to be seen as working today for elevated blood pressures  He returns to clinic today accompanied by his wife.  He stated on Saturday around 5 PM he started having intense throbbing relisted area on the left side of his neck, blood pressure started rising on he started having uncontrollable shaking/tremors.  About 7 PM blood pressure was 230/130 and he had a heart rate of 120.  Typically has lower blood pressure lowering heart rate as he has been very active.  When his blood pressure was elevated they called 911 and he was taken to the emergency department.  Blood pressures remain mildly stopped after he was given nitroglycerin after few hours.  He was admitted after his troponin level resulted at 347.  Heart catheterization revealed no blockages mild nonobstructive disease.  He was treated and subsequently restarted on his lisinopril  discharge  from the facility with blood pressure slightly elevated but improved.  Stated that he had another attack this morning around 235 with throbbing on the left base of his skull with a severe headache and intense shaking with a blood pressure 175/96.  He took an additional lisinopril  and thinks several details about  unknown if later.  515 this morning he had a milder attack with throbbing pain at this time.  As noted blood pressure was 157/90 with no shaking.  That would last for approximately an hour and a half as well.  He stated he reached out to vascular which advised spikes his blood pressure may very well be the body essentially resetting his thermostat after stent placement.  He did have hypotension after his original stent was placed to the left carotid and his lisinopril  that he had been on for at least the last 3 years have been discontinued.  It was recently restarted during his hospitalization but he had been off of that for approximately a week and a half.  He has continued to take his blood pressure has been up and down.  He also was concerned about his heart rate being elevated as he is used to having had lower heart rate.  He denies any chest pain, shortness of breath, dizziness/lightheadedness or palpitations.  He continues to have occasional headaches without visual changes, throbbing into his left neck after recent stent placement, and shaking with elevated blood pressures.  ROS: 10 point review of system has been reviewed and considered negative except ones been listed in the HPI  Studies Reviewed EKG Interpretation Date/Time:  Tuesday September 20 2023 13:59:02 EDT Ventricular Rate:  77 PR Interval:  156 QRS Duration:  100 QT Interval:  400 QTC Calculation: 452 R Axis:   -27  Text Interpretation: Normal sinus rhythm Minimal voltage criteria for LVH, may be normal variant ( R in aVL ) When compared with ECG of 18-Sep-2023 13:02, Inverted T waves have replaced nonspecific T wave abnormality in Inferior leads T wave amplitude has increased in Lateral leads Confirmed by Ronald Cockayne ([id number]) on 09/20/2023 2:02:19 PM    [facility] 09/19/2023   Ost LM lesion is 30% stenosed.   1st Diag lesion is 40% stenosed.   The left ventricular systolic function is normal.   LV end diastolic pressure is normal.   The  left ventricular ejection fraction is 55-65% by visual estimate.   1.  Mild to moderate nonobstructive coronary artery disease. 2.  Normal LV systolic function and high normal left ventricular end-diastolic pressure.   Recommendations: Elevated troponin is likely due to supply demand mismatch.  Recommend medical therapy.  I discontinued heparin  drip.   Risk Assessment/Calculations           Physical Exam VS:  BP (!) 150/80 (BP Location: Left Arm, Patient Position: Sitting, Cuff Size: Normal)   Pulse 77   Ht 6' (1.829 m)   Wt 186 lb 4 oz (84.5 kg)   SpO2 98%   BMI 25.26 kg/m    Wt Readings from Last 3 Encounters:  09/20/23 186 lb 4 oz (84.5 kg)  09/19/23 185 lb 13.6 oz (84.3 kg)  09/08/23 191 lb 9.3 oz (86.9 kg)    GEN: Well nourished, well developed in no acute distress NECK: No JVD; No carotid bruits CARDIAC: RRR, no murmurs, rubs, gallops RESPIRATORY:  Clear to auscultation without rales, wheezing or rhonchi  ABDOMEN: Soft, non-tender, non-distended EXTREMITIES:  No edema; No deformity, right radial cath site plus  radial pulse covered with a Band-Aid with no bleeding, bruising, or hematoma  ASSESSMENT AND PLAN Nonobstructive coronary artery disease status post [facility] 09/19/2023.  Denies any recurrent chest discomfort.  EKG today reveals sinus rhythm rate of 77 with T wave inversions in the inferior leads.  Previous stress test that was scheduled prior to his catheterization has been discontinued.  He has been continued on aspirin  81 mg daily and rosuvastatin  40 mg daily as well as clopidogrel  75 mg daily.  We are also sending him in a prescription for Nitrostat 0.4 mg sublingual as needed with instructions given.  Continue with aggressive medical therapy for prevention of progression of disease.  Primary hypertension with a blood pressure today 158/80.  Patient has had increasing blood pressures over the last several days.  Noted that he had a longstanding history of high blood  pressure for the majority of his adult life and has been on medication for at least 3 years with subsequent hypotension status post left carotid artery stent placement where he has been off of medication for approximately a week with the reinitiation of 5 mg lisinopril  starting on yesterday.  Blood pressures still remain to be suboptimally controlled his lisinopril  has been increased to 5 mg twice daily he has been asked to monitor his pressure 1 to 2 hours postmedication administration.  He will need a repeat BMP on return after increasing ACE inhibitor's.  If increasing his lisinopril  on does not impact blood pressure will start with secondary hypertension evaluation with renal artery duplex and renin aldosterone levels.  Carotid artery stenosis s/p left carotid stenting currently on dual antiplatelet therapy with ongoing follow-up per vascular.  Mixed hyperlipidemia with an LDL of 126.  Target LDL would be 55 or less. Continue on rosuvastatin  40 mg daily with a repeat lipid and hepatic panel in 10 to 12 weeks.    Dispo: Patient return to clinic to see MD/APP in 3 weeks or sooner if needed for further evaluation of symptoms.  Signed, Khyle Goodell, NP

## 2023-09-20 NOTE — Transitions of Care (Post Inpatient/ED Visit) (Signed)
 09/20/2023  Name: Juan Salas MRN: [id number] DOB: [birth year]  Today's TOC FU Call Status: Today's TOC FU Call Status:: Successful TOC FU Call Completed TOC FU Call Complete Date: 09/20/23 Patient's Name and Date of Birth confirmed.  Transition Care Management Follow-up Telephone Call Date of Discharge: 09/19/23 Discharge Facility: [facility]) Type of Discharge: Inpatient Admission Primary Inpatient Discharge Diagnosis:: NSTEMI How have you been since you were released from the hospital?: Better Any questions or concerns?: No  Items Reviewed: Did you receive and understand the discharge instructions provided?: Yes Medications obtained,verified, and reconciled?: Yes (Medications Reviewed) Any new allergies since your discharge?: No Dietary orders reviewed?: Yes Do you have support at home?: Yes People in Home [RPT]: spouse  Medications Reviewed Today: Medications Reviewed Today     Reviewed by Darrall Ellison, LPN (Licensed Practical Nurse) on 09/20/23 at 1024  Med List Status: <None>   Medication Order Taking? Sig Documenting Provider Last Dose Status Informant  acetaminophen  (TYLENOL ) 500 MG tablet [id number] No Take 500 mg by mouth every 4 (four) hours as needed for moderate pain or headache.  [provider] Past Month Active Self, Pharmacy Records  albuterol  (VENTOLIN  HFA) 108 (90 Base) MCG/ACT inhaler [id number] No Inhale 2 puffs into the lungs every 6 (six) hours as needed for wheezing or shortness of breath. Scherrie Curt, MD Past Month Active Self, Pharmacy Records  aspirin  EC 81 MG tablet [id number] No Take 81 mg by mouth daily. Swallow whole. [provider] 09/07/2023 Active Self, Pharmacy Records  calcium  carbonate (TUMS - DOSED IN MG ELEMENTAL CALCIUM ) 500 MG chewable tablet [id number] No Chew 1 tablet by mouth 2 (two) times daily as needed for indigestion or heartburn. [provider] Past Week Active Self,  Pharmacy Records  Cholecalciferol  (VITAMIN D3) 50 MCG (2000 UT) TABS [id number] No Take 1 tablet by mouth daily at 6 (six) AM. [provider] 09/07/2023 Active Self, Pharmacy Records  clopidogrel  (PLAVIX ) 75 MG tablet [id number] No Take 1 tablet (75 mg total) by mouth daily. [employer], MD 09/17/2023 Morning Active Self, Pharmacy Records  cyanocobalamin  2000 MCG tablet [id number] No Take 2,000 mcg by mouth daily. [provider] 09/17/2023 Active Self, Pharmacy Records  L-THEANINE PO [id number] No Take 200 mg by mouth daily as needed (stress). [provider] 09/07/2023 Active Self, Pharmacy Records  lisinopril  (ZESTRIL ) 5 MG tablet [id number] No Take 5 mg by mouth at bedtime. [provider] 09/16/2023 Active Self, Pharmacy Records  Magnesium  250 MG TABS [id number] No Take 250 mg by mouth daily as needed. [provider] 09/07/2023 Active Self, Pharmacy Records  montelukast  (SINGULAIR ) 10 MG tablet [id number] No Take 1 tablet (10 mg total) by mouth at bedtime. Scherrie Curt, MD 09/16/2023 Active Self, Pharmacy Records  Multiple Vitamin (MULTIVITAMIN WITH MINERALS) TABS tablet [id number] No Take 1 tablet by mouth daily. [provider] 09/07/2023 Active Self, Pharmacy Records  naproxen (NAPROSYN) 250 MG tablet [id number] No Take 250 mg by mouth as needed. [provider] Past Month Active Self, Pharmacy Records  Omega-3 Fatty Acids (FISH OIL) 1000 MG CAPS [id number] No Take 1,000 mg by mouth daily. [provider] 09/17/2023 Active Self, Pharmacy Records  pantoprazole  (PROTONIX ) 40 MG tablet [id number] No Take 40 mg by mouth daily. [provider] 09/17/2023 Active Self, Pharmacy Records  rosuvastatin  (CRESTOR ) 40 MG tablet [id number] No Take 1 tablet (40 mg total) by mouth every morning. Constancia Delton, MD 09/17/2023 Active Self, Pharmacy Records  tadalafil (CIALIS)  20 MG tablet [id number] No Take 20 mg by mouth daily as needed.  [provider] Past Month Active Self, Pharmacy Records            Home Care and Equipment/Supplies: Were Home Health Services Ordered?: NA Any new equipment or medical supplies ordered?: NA  Functional Questionnaire: Do you need assistance with bathing/showering or dressing?: No Do you need assistance with meal preparation?: No Do you need assistance with eating?: No Do you have difficulty maintaining continence: No Do you need assistance with getting out of bed/getting out of a chair/moving?: No Do you have difficulty managing or taking your medications?: No  Follow up appointments reviewed: PCP Follow-up appointment confirmed?: Yes Date of PCP follow-up appointment?: 09/26/23 Follow-up Provider: [facility] Follow-up appointment confirmed?: Yes Date of Specialist follow-up appointment?: 09/20/23 Follow-Up Specialty Provider:: vascular Do you need transportation to your follow-up appointment?: No Do you understand care options if your condition(s) worsen?: Yes-patient verbalized understanding    SIGNATURE Darrall Ellison, LPN [facility] Nurse Health Advisor Direct Dial [phone]

## 2023-09-20 NOTE — Telephone Encounter (Signed)
 Pt c/o BP issue:  1. What are your last 5 BP readings? 175/96, 160/95, 144/84 all of these this morning.  2. Are you having any other symptoms (ex. Dizziness, headache, blurred vision, passed out)? Bad headache, started at the back of his head, extreme shaking in his body, throbbing wher he had his stent put in 2 weeks ago  3. What is your medication issue? Blood pressure is spiking. Patient would like to be seen before his appointment on Thursday

## 2023-09-20 NOTE — Telephone Encounter (Signed)
 Patient left a message with the triage nurse, stating he had carotid stents placed 09/08/23 and his blood pressure is 154/90. Is that something he should be worried about.  Patient has also called his cardiologist. (In chart)

## 2023-09-20 NOTE — Telephone Encounter (Signed)
 154/90 is an adequate pressure.  I would defer to cardiologist if he would like lower but ok from vascular perspective

## 2023-09-20 NOTE — Telephone Encounter (Signed)
 Spoke with patient.  Patient states that his blood pressure has improved and that he currently is not having a headache and the shaking has stopped.  Pt stated he was in hospital over the weekend due to his blood pressure being 230/130, had several test which he stated showed no blockages in his heart; pt has reached out by calling Dr. Ileen Mallet [facility] and is waiting for a return call for further recommendations.  Pt currently has an appointment with Ronald Cockayne NP on 6/5 and has also been placed on the list to see someone sooner if an appointment becomes available.  Pt thanked me for calling back.  Pt instructed to go to Emergency Room if symptoms worsen before his appointment on 6/5.

## 2023-09-20 NOTE — Telephone Encounter (Signed)
**Note De-identified  [place] Obfuscation** Please advise 

## 2023-09-20 NOTE — Patient Instructions (Signed)
 Medication Instructions:  Your physician recommends the following medication changes.  INCREASE: Lisinopril  5 mg twice daily  *If you need a refill on your cardiac medications before your next appointment, please call your pharmacy*  Lab Work: Your provider would like for you to return in 3 weeks to have the following labs drawn: CBC BMP.   Please go to [facility] [address] ([facility] [address], [place] [address] You do not need an appointment.  They are open from 8 am- 4:30 pm.  Lunch from 1:00 pm- 2:00 pm You do not need to be fasting.   You may also go to one of the following [facility]:  [address] [place], [place] [address] Phone: [phone] Lab hours: Mon-Fri 8 am- 5 pm    Lunch 12 pm- 1 pm  [address] [place],  [place]  [address]  [place]  Phone: [phone] Lab hours: 7 am- 4 pm Lunch 12 pm-1 pm   [address] [place],  [place]  [address]  [place]  Phone: [phone] Lab hours: Mon-Fri 8 am- 5 pm    Lunch 12 pm- 1 pm  If you have labs (blood work) drawn today and your tests are completely normal, you will receive your results only by: MyChart Message (if you have MyChart) OR A paper copy in the mail If you have any lab test that is abnormal or we need to change your treatment, we will call you to review the results.  Testing/Procedures: No test ordered today   Follow-Up: At [facility], you and your health needs are our priority.  As part of our continuing mission to provide you with exceptional heart care, our providers are all part of one team.  This team includes your primary Cardiologist (physician) and Advanced Practice Providers or APPs (Physician Assistants and Nurse Practitioners) who all work together to provide you with the care you need, when you need it.  Your next appointment:   3 week(s)  Provider:   Constancia Delton, MD or Ronald Cockayne, NP

## 2023-09-22 ENCOUNTER — Ambulatory Visit: Admitting: Cardiology

## 2023-09-25 NOTE — Progress Notes (Unsigned)
     Rossie Scarfone T. Lillyann Ahart, MD, CAQ Sports Medicine [facility] at [facility] [address] [place], [address]  Phone: [phone]  FAX: [phone]  SABINO DENNING - 63 y.o. male  MRN [id number]  Date of Birth: 06/12/1960  Date: 09/26/2023  PCP: Scherrie Curt, MD  Referral: Scherrie Curt, MD  No chief complaint on file.  Subjective:   DONTA FUSTER is a 63 y.o. very pleasant male patient with There is no height or weight on file to calculate BMI. who presents with the following:  The patient is here to follow-up regarding his 2 recent hospitalizations.  Admit date: 09/08/2023 Discharge date: 09/09/2023  He was admitted for surgical procedure by vascular surgery on this date.  He had a left endovascular carotid stent placed.  At that time he was discharged to routine care.  Subsequently, he went to the emergency room on Sep 17, 2023.  He ultimately was admitted on September 19, 2023.  On the 31st, he presented with some acute left-sided neck pain.  They did do a CT angiogram which showed a 50% stenosis at the stent.  He did present with some severely high blood pressures.  He also had an elevated troponin. He ultimately was admitted with a NSTEMI.  He did have a left heart cath which showed mild to moderate nonobstructive coronary disease.  He was discharged and he high-dose rosuvastatin .  He is currently on dual antiplatelet therapy for recent carotid surgery.    Review of Systems is noted in the HPI, as appropriate  Objective:   There were no vitals taken for this visit.  GEN: No acute distress; alert,appropriate. PULM: Breathing comfortably in no respiratory distress PSYCH: Normally interactive.   Laboratory and Imaging Data:  Assessment and Plan:   ***

## 2023-09-26 ENCOUNTER — Ambulatory Visit: Payer: Self-pay | Admitting: Family Medicine

## 2023-09-26 ENCOUNTER — Ambulatory Visit: Admitting: Family Medicine

## 2023-09-26 ENCOUNTER — Encounter: Payer: Self-pay | Admitting: Family Medicine

## 2023-09-26 VITALS — BP 130/68 | HR 85 | Temp 98.8°F | Ht 72.0 in | Wt 187.2 lb

## 2023-09-26 DIAGNOSIS — I160 Hypertensive urgency: Secondary | ICD-10-CM | POA: Diagnosis not present

## 2023-09-26 DIAGNOSIS — I214 Non-ST elevation (NSTEMI) myocardial infarction: Secondary | ICD-10-CM

## 2023-09-26 DIAGNOSIS — I6522 Occlusion and stenosis of left carotid artery: Secondary | ICD-10-CM

## 2023-09-26 DIAGNOSIS — Z79899 Other long term (current) drug therapy: Secondary | ICD-10-CM | POA: Diagnosis not present

## 2023-09-26 LAB — BASIC METABOLIC PANEL WITH GFR
BUN: 13 mg/dL (ref 6–23)
CO2: 27 meq/L (ref 19–32)
Calcium: 9.9 mg/dL (ref 8.4–10.5)
Chloride: 101 meq/L (ref 96–112)
Creatinine, Ser: 0.98 mg/dL (ref 0.40–1.50)
GFR: 81.71 mL/min (ref >60.00)
Glucose, Bld: 97 mg/dL (ref 70–99)
Potassium: 4.5 meq/L (ref 3.5–5.1)
Sodium: 136 meq/L (ref 135–145)

## 2023-09-27 ENCOUNTER — Encounter: Payer: Self-pay | Admitting: Family Medicine

## 2023-09-30 ENCOUNTER — Encounter: Admitting: Vascular Surgery

## 2023-09-30 ENCOUNTER — Encounter (HOSPITAL_COMMUNITY)

## 2023-09-30 NOTE — Telephone Encounter (Signed)
 CT Angio Neck completed 08/01/23, within Auth window.   Nothing further needed.

## 2023-10-06 ENCOUNTER — Other Ambulatory Visit

## 2023-10-06 ENCOUNTER — Other Ambulatory Visit (INDEPENDENT_AMBULATORY_CARE_PROVIDER_SITE_OTHER): Payer: Self-pay | Admitting: Vascular Surgery

## 2023-10-06 DIAGNOSIS — I6523 Occlusion and stenosis of bilateral carotid arteries: Secondary | ICD-10-CM

## 2023-10-07 ENCOUNTER — Encounter (INDEPENDENT_AMBULATORY_CARE_PROVIDER_SITE_OTHER): Payer: Self-pay | Admitting: Nurse Practitioner

## 2023-10-07 ENCOUNTER — Ambulatory Visit (INDEPENDENT_AMBULATORY_CARE_PROVIDER_SITE_OTHER): Admitting: Nurse Practitioner

## 2023-10-07 ENCOUNTER — Ambulatory Visit (INDEPENDENT_AMBULATORY_CARE_PROVIDER_SITE_OTHER)

## 2023-10-07 VITALS — BP 142/83 | HR 62 | Resp 18 | Ht 72.0 in | Wt 187.4 lb

## 2023-10-07 DIAGNOSIS — I10 Essential (primary) hypertension: Secondary | ICD-10-CM

## 2023-10-07 DIAGNOSIS — I6522 Occlusion and stenosis of left carotid artery: Secondary | ICD-10-CM

## 2023-10-07 DIAGNOSIS — E785 Hyperlipidemia, unspecified: Secondary | ICD-10-CM

## 2023-10-07 DIAGNOSIS — I6523 Occlusion and stenosis of bilateral carotid arteries: Secondary | ICD-10-CM

## 2023-10-09 ENCOUNTER — Encounter (INDEPENDENT_AMBULATORY_CARE_PROVIDER_SITE_OTHER): Payer: Self-pay | Admitting: Nurse Practitioner

## 2023-10-09 NOTE — Progress Notes (Signed)
 Subjective:    Patient ID: Juan Salas, male    DOB: 02/27/1961, 63 y.o.   MRN: [id number] Chief Complaint  Patient presents with   Follow-up    4 week follow up carotid ultrasounds     The patient is a 63 year old male who follows up today following left ICA stent placement on 09/08/2023.  He had a 98% stenosis of the left ICA prior to intervention.  Following intervention he had several episodes of hypertensive crises as well as neck pain and discomfort.  The hypertensive episodes have resolved with medication changes he still continues to have some neck soreness but this is resolving slowly each day.  Shortly after his carotid stenting he had a CT which suggested he may have about a 50% stenosis of the newly placed stent.  However I suspect this was more so related to vessel artifact and likely some constriction post stent placement.  Today his right ICA is widely patent with no evidence of significant stenosis in the left ICA stent is also currently patent with no significant stenosis.  There is normal flow hemodynamics in the bilateral vertebrals as well as the bilateral subclavian arteries.    Review of Systems  Musculoskeletal:  Positive for neck pain.  All other systems reviewed and are negative.      Objective:   Physical Exam Vitals reviewed.  HENT:     Head: Normocephalic.  Neck:     Vascular: No carotid bruit.   Cardiovascular:     Rate and Rhythm: Normal rate.     Pulses: Normal pulses.  Pulmonary:     Effort: Pulmonary effort is normal.   Skin:    General: Skin is warm and dry.   Neurological:     Mental Status: He is alert and oriented to person, place, and time.   Psychiatric:        Mood and Affect: Mood normal.        Behavior: Behavior normal.        Thought Content: Thought content normal.        Judgment: Judgment normal.     BP (!) 142/83 (BP Location: Left Arm, Patient Position: Sitting, Cuff Size: Normal)   Pulse 62   Resp 18   Ht 6'  (1.829 m)   Wt 187 lb 6.4 oz (85 kg)   BMI 25.42 kg/m   Past Medical History:  Diagnosis Date   Allergy over 20 years ago   chronic allergies  year round   Arrhythmia 08/16/2023   After chopping wood   Arthritis Within last 10-15 years   spinal arthritis, fingers   Asthma 2023   After having Covid the second time   Fibromyalgia    GAD (generalized anxiety disorder)    GERD without esophagitis 05/26/2023   History of diverticulitis 05/26/2023   History of kidney stones 1977, 1983,2000   passed on his own. 7 different cycles of stones during youth.   Hyperlipidemia    Family genetics   Hypertension 12/2017   Left carotid artery stenosis 05/26/2023   Migraine headache    Nocturia associated with benign prostatic hyperplasia 12/2017   Spinal stenosis     Social History   Socioeconomic History   Marital status: Married    Spouse name: Debbie   Number of children: 2   Years of education: Not on file   Highest education level: Master's degree (e.g., MA, MS, MEng, MEd, MSW, MBA)  Occupational History   Occupation: Occupational psychologist  Employer: [employer]  Tobacco Use   Smoking status: Never   Smokeless tobacco: Never  Vaping Use   Vaping status: Never Used  Substance and Sexual Activity   Alcohol use: Not Currently   Drug use: Never   Sexual activity: Not Currently  Other Topics Concern   Not on file  Social History Narrative   Non-smoker   No ETOH   No drugs   Married (Debbie)   Exercises at least 3x/week   Theatre manager (Retired)      [employer]   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   Social Drivers of Corporate investment banker Strain: Low Risk  (05/22/2023)   Overall Financial Resource Strain (CARDIA)    Difficulty of Paying Living Expenses: Not hard at all  Food Insecurity: No Food Insecurity (09/18/2023)   Hunger Vital Sign    Worried About Running Out of Food in the Last Year: Never true    Ran Out of Food in the Last  Year: Never true  Transportation Needs: No Transportation Needs (09/18/2023)   PRAPARE - Administrator, Civil Service (Medical): No    Lack of Transportation (Non-Medical): No  Physical Activity: Sufficiently Active (05/22/2023)   Exercise Vital Sign    Days of Exercise per Week: 6 days    Minutes of Exercise per Session: 60 min  Stress: No Stress Concern Present (05/22/2023)   [employer] of Occupational Health - Occupational Stress Questionnaire    Feeling of Stress : Only a little  Social Connections: Moderately Isolated (09/18/2023)   Social Connection and Isolation Panel    Frequency of Communication with Friends and Family: Twice a week    Frequency of Social Gatherings with Friends and Family: Once a week    Attends Religious Services: Never    Database administrator or Organizations: No    Attends Engineer, structural: Not on file    Marital Status: Married  Catering manager Violence: Not At Risk (09/18/2023)   Humiliation, Afraid, Rape, and Kick questionnaire    Fear of Current or Ex-Partner: No    Emotionally Abused: No    Physically Abused: No    Sexually Abused: No    Past Surgical History:  Procedure Laterality Date   APPENDECTOMY  2000   CAROTID PTA/STENT INTERVENTION N/A 09/08/2023   Procedure: CAROTID PTA/STENT INTERVENTION;  Surgeon: Marea Selinda RAMAN, MD;  Location: [facility] INVASIVE CV LAB;  Service: Cardiovascular;  Laterality: N/A;   COLONOSCOPY     COLONOSCOPY WITH PROPOFOL  N/A 09/06/2019   Procedure: COLONOSCOPY WITH PROPOFOL ;  Surgeon: Therisa Bi, MD;  Location: [facility] ENDOSCOPY;  Service: Gastroenterology;  Laterality: N/A;   CYSTOSCOPY WITH INSERTION OF UROLIFT N/A 01/17/2018   Procedure: CYSTOSCOPY WITH INSERTION OF UROLIFT;  Surgeon: Twylla Glendia BROCKS, MD;  Location: [facility] ORS;  Service: Urology;  Laterality: N/A;   CYSTOSCOPY WITH INSERTION OF UROLIFT  01/2018   CYSTOSCOPY WITH INSERTION OF UROLIFT Bilateral    HERNIA REPAIR Left 2000    double inguinal hernia repairs   HYDROCELE EXCISION Left 10/06/2021   Procedure: HYDROCELECTOMY ADULT GROIN/UNILATERAL SCROTAL APPROACH;  Surgeon: Twylla Glendia BROCKS, MD;  Location: [facility] ORS;  Service: Urology;  Laterality: Left;   LEFT HEART CATH AND CORONARY ANGIOGRAPHY N/A 09/19/2023   Procedure: LEFT HEART CATH AND CORONARY ANGIOGRAPHY;  Surgeon: Darron Deatrice LABOR, MD;  Location: [facility] INVASIVE CV LAB;  Service: Cardiovascular;  Laterality: N/A;   LUMBAR LAMINECTOMY  laminectomy x 2. 1990, 1994   NASAL SEPTUM SURGERY  1978   repair of sinuses also   SPINE SURGERY  1990, 1994   TESTICULAR TORSION REPAIR  2005   TONSILLECTOMY  1970    Family History  Problem Relation Age of Onset   Hypertension Mother    Arthritis Mother    Hyperlipidemia Mother    Miscarriages / [place] Mother    Stroke Mother    Varicose Veins Mother    Prostatitis Father    Hypertension Father    Hyperlipidemia Father    Hearing loss Maternal Grandfather    Hyperlipidemia Maternal Grandfather    Hypertension Maternal Grandfather    Kidney disease Maternal Grandfather    Stroke Maternal Grandfather    Arthritis Maternal Grandmother    Hyperlipidemia Maternal Grandmother    Hypertension Maternal Grandmother    Stroke Maternal Grandmother    Arthritis Paternal Grandfather    Hearing loss Paternal Grandfather    Hyperlipidemia Paternal Grandfather    Hypertension Paternal Grandfather    Stroke Paternal Grandfather    Hearing loss Paternal Grandmother     Allergies  Allergen Reactions   Atorvastatin  Other (See Comments)    Joint pain Joint pain    Prednisone Other (See Comments)    After 3 days agitation and sleeplessness After 3 days agitation and sleeplessness    Verapamil  Nausea Only    Other reaction(s): Headache insomnia insomnia    Metoprolol  Palpitations    Per patient, can't tolerate. Per patient, can't tolerate.    Seasonal Ic [Cholestatin] Other (See Comments)    Sinus  issues   Sulfa  Antibiotics Rash       Latest Ref Rng & Units 09/19/2023    4:02 AM 09/17/2023    8:40 PM 09/09/2023    4:52 AM  CBC  WBC 4.0 - 10.5 K/uL 5.4  6.1  6.3   Hemoglobin 13.0 - 17.0 g/dL 87.8  87.5  89.3   Hematocrit 39.0 - 52.0 % 36.6  36.7  31.9   Platelets 150 - 400 K/uL 261  267  229       CMP     Component Value Date/Time   NA 136 09/26/2023 1131   NA 138 08/29/2023 1035   K 4.5 09/26/2023 1131   CL 101 09/26/2023 1131   CO2 27 09/26/2023 1131   GLUCOSE 97 09/26/2023 1131   BUN 13 09/26/2023 1131   BUN 16 08/29/2023 1035   CREATININE 0.98 09/26/2023 1131   CALCIUM  9.9 09/26/2023 1131   PROT 7.0 08/25/2023 1430   PROT 7.0 07/01/2020 0923   ALBUMIN 4.8 08/25/2023 1430   ALBUMIN 4.8 07/01/2020 0923   AST 22 08/25/2023 1430   ALT 26 08/25/2023 1430   ALKPHOS 59 08/25/2023 1430   BILITOT 0.4 08/25/2023 1430   BILITOT 0.3 07/01/2020 0923   GFR 81.71 09/26/2023 1131   EGFR 82 08/29/2023 1035   GFRNONAA >60 09/19/2023 0402     No results found.     Assessment & Plan:   1. Left carotid artery stenosis (Primary) Recommend:  The patient is s/p successful left carotid stent  Duplex ultrasound  shows widely patent left ICA stent with no significant stenosis of the right  Continue dual antiplatelet therapy as prescribed for a minimum of 6 months Continue management of CAD, HTN and Hyperlipidemia Healthy heart diet,  encouraged exercise at least 4 times per week  The patient's NIHSS score is as follows: 0 Mild: 1 - 5  Mild to Moderately Severe: 5 - 14 Severe: 15 - 24 Very Severe: >25  Follow up in 3 months with duplex ultrasound and physical exam based on the patient's carotid surgery   2. Essential hypertension Continue antihypertensive medications as already ordered, these medications have been reviewed and there are no changes at this time.  His recent changes to his antihypertensive medication have been successful thus far however if he  continues to have issues with blood pressure control renal artery duplex may be helpful.  3. Hyperlipidemia, unspecified hyperlipidemia type Continue statin as ordered and reviewed, no changes at this time   Current Outpatient Medications on File Prior to Visit  Medication Sig Dispense Refill   acetaminophen  (TYLENOL ) 500 MG tablet Take 500 mg by mouth every 4 (four) hours as needed for moderate pain or headache.      aspirin  EC 81 MG tablet Take 81 mg by mouth daily. Swallow whole.     calcium  carbonate (TUMS - DOSED IN MG ELEMENTAL CALCIUM ) 500 MG chewable tablet Chew 1 tablet by mouth 2 (two) times daily as needed for indigestion or heartburn.     Cholecalciferol  (VITAMIN D3) 50 MCG (2000 UT) TABS Take 1 tablet by mouth daily at 6 (six) AM.     clopidogrel  (PLAVIX ) 75 MG tablet Take 1 tablet (75 mg total) by mouth daily. 30 tablet 6   cyanocobalamin  2000 MCG tablet Take 2,000 mcg by mouth daily.     L-THEANINE PO Take 200 mg by mouth daily as needed (stress).     lisinopril  (ZESTRIL ) 5 MG tablet Take 1 tablet (5 mg total) by mouth 2 (two) times daily. 180 tablet 3   Magnesium  250 MG TABS Take 250 mg by mouth daily as needed.     montelukast  (SINGULAIR ) 10 MG tablet Take 1 tablet (10 mg total) by mouth at bedtime. 30 tablet 3   Multiple Vitamin (MULTIVITAMIN WITH MINERALS) TABS tablet Take 1 tablet by mouth daily.     naproxen (NAPROSYN) 250 MG tablet Take 250 mg by mouth as needed.     nitroGLYCERIN  (NITROSTAT ) 0.4 MG SL tablet Place 1 tablet (0.4 mg total) under the tongue every 5 (five) minutes as needed for chest pain. 90 tablet 3   Omega-3 Fatty Acids (FISH OIL) 1000 MG CAPS Take 1,000 mg by mouth daily.     pantoprazole  (PROTONIX ) 40 MG tablet Take 40 mg by mouth daily.     rosuvastatin  (CRESTOR ) 40 MG tablet Take 1 tablet (40 mg total) by mouth every morning. 90 tablet 3   tadalafil (CIALIS) 20 MG tablet Take 20 mg by mouth daily as needed.     No current facility-administered  medications on file prior to visit.    There are no Patient Instructions on file for this visit. No follow-ups on file.   Shavonte Zhao E Jaquille Kau, NP

## 2023-10-11 ENCOUNTER — Ambulatory Visit: Payer: Self-pay | Admitting: Cardiology

## 2023-10-11 LAB — BASIC METABOLIC PANEL WITH GFR
BUN/Creatinine Ratio: 15 (ref 10–24)
BUN: 17 mg/dL (ref 8–27)
CO2: 22 mmol/L (ref 20–29)
Calcium: 9.6 mg/dL (ref 8.6–10.2)
Chloride: 101 mmol/L (ref 96–106)
Creatinine, Ser: 1.1 mg/dL (ref 0.76–1.27)
Glucose: 89 mg/dL (ref 70–99)
Potassium: 4.7 mmol/L (ref 3.5–5.2)
Sodium: 138 mmol/L (ref 134–144)
eGFR: 75 mL/min/{1.73_m2} (ref >59)

## 2023-10-11 LAB — CBC
Hematocrit: 41.1 % (ref 37.5–51.0)
Hemoglobin: 13.4 g/dL (ref 13.0–17.7)
MCH: 31.2 pg (ref 26.6–33.0)
MCHC: 32.6 g/dL (ref 31.5–35.7)
MCV: 96 fL (ref 79–97)
Platelets: 246 10*3/uL (ref 150–450)
RBC: 4.3 x10E6/uL (ref 4.14–5.80)
RDW: 13.5 % (ref 11.6–15.4)
WBC: 4.5 10*3/uL (ref 3.4–10.8)

## 2023-10-11 NOTE — Progress Notes (Signed)
 Labs are stable.  Continue current medication regimen without changes needed at this time.

## 2023-10-12 ENCOUNTER — Ambulatory Visit: Attending: Cardiology | Admitting: Cardiology

## 2023-10-12 ENCOUNTER — Encounter: Payer: Self-pay | Admitting: Cardiology

## 2023-10-12 ENCOUNTER — Ambulatory Visit: Admitting: Internal Medicine

## 2023-10-12 VITALS — BP 106/78 | HR 64 | Ht 72.0 in | Wt 187.0 lb

## 2023-10-12 DIAGNOSIS — I6522 Occlusion and stenosis of left carotid artery: Secondary | ICD-10-CM | POA: Diagnosis not present

## 2023-10-12 DIAGNOSIS — I10 Essential (primary) hypertension: Secondary | ICD-10-CM

## 2023-10-12 DIAGNOSIS — I2510 Atherosclerotic heart disease of native coronary artery without angina pectoris: Secondary | ICD-10-CM

## 2023-10-12 DIAGNOSIS — E782 Mixed hyperlipidemia: Secondary | ICD-10-CM

## 2023-10-12 MED ORDER — LISINOPRIL 5 MG PO TABS: 5.0000 mg | ORAL_TABLET | Freq: Two times a day (BID) | ORAL | 3 refills | Status: DC

## 2023-10-12 NOTE — Patient Instructions (Signed)
 Medication Instructions:  Your physician recommends that you continue on your current medications as directed. Please refer to the Current Medication list given to you today.   *If you need a refill on your cardiac medications before your next appointment, please call your pharmacy*  Lab Work: No labs ordered today  If you have labs (blood work) drawn today and your tests are completely normal, you will receive your results only by: MyChart Message (if you have MyChart) OR A paper copy in the mail If you have any lab test that is abnormal or we need to change your treatment, we will call you to review the results.  Testing/Procedures: No test ordered today   Follow-Up: At [facility], you and your health needs are our priority.  As part of our continuing mission to provide you with exceptional heart care, our providers are all part of one team.  This team includes your primary Cardiologist (physician) and Advanced Practice Providers or APPs (Physician Assistants and Nurse Practitioners) who all work together to provide you with the care you need, when you need it.  Your next appointment:   3 month(s)  Provider:   Redell Cave, MD or Tylene Lunch, NP

## 2023-10-12 NOTE — Progress Notes (Signed)
 Cardiology Office Note   Date:  10/12/2023  ID:  DOMINIE BENEDICK, DOB [birth year], MRN [id number] PCP: Watt Mirza, MD  [facility] Providers Cardiologist:  Redell Cave, MD     History of Present Illness Juan Salas is a 63 y.o. male with a past medical history of hyperlipidemia, pulm hypertension, left carotid artery stenosis status post stent (09/06/2023), DVT, fibromyalgia, coronary disease status post recent NSTEMI (09/18/2023, who is here today for follow-up.   He had previously been evaluated in cardiology clinic on 08/29/2023 as a new patient for shortness of breath and chest tightness.  He saw vascular surgery for left carotid artery stenosis of 95% on neck CTA.  Echocardiogram revealed LVEF 60 to 65%, no RWMA, and no valvular disease.  Cardiac CT was not performed due to insurance denial, Morris Bullock was ordered but had not been performed prior to hospitalization.  He underwent left carotid stent placement on 09/08/2023 by Dr. Marea.  He presented to the [facility] emergency department on 09/18/2023 with elevated blood pressure 177/90, pulse 94, respiration 20, 100% oxygen saturations on room air.  Pertinent labs revealed sodium 134, potassium 3.4, hemoglobin 12.5.  EKG showed normal sinus rhythm with rate 95 bpm with no significant changes.  CT of the head was negative.  CT of the head and neck was negative for large vessel occlusion or other emergent finding, flow through the stent with focal narrowing at the mid aspect of [place] diffuse 50% stenosis.  Vascular surgery was consulted who reported nothing to do at this time outpatient follow-up is recommended.  High-sensitivity troponin went from 30-3 47 and he was started on IV heparin  infusion and admitted to the hospital.  On 09/19/2023 he underwent left heart catheterization which revealed mild to moderate nonobstructive coronary artery disease, normal LV systolic function with normal left ventricular end-diastolic  pressures.  Elevated troponin was likely due to supply/demand mismatch.  Recommended medical therapy and heparin  drip was discontinued.  Vitals were considered stable and he was able to be discharged after his catheterization on 09/19/2023.  He was continued on aspirin  81 mg daily, clopidogrel  75 mg daily, lisinopril  5 mg daily and rosuvastatin  40 mg daily.    He was last seen in clinic 09/20/2023 accompanied by his wife.  He was concerned that during hospitalization his medications were changed around.  He continued to take his blood pressure and had been up and down.  He was concerned about his heart rate been elevated.  Denied any other associated symptoms of chest pain.  Lisinopril  was increased to 5 mg twice daily.  He was scheduled for labs.  He returns to clinic today accompanied by his wife.  He states that he has been doing very well and has only had 1 or 2 spikes in his blood pressure noted but they were typically with stressful situations.  Denies any chest pain, shortness of breath, peripheral edema.  Feels as though he is not back to his baseline.  He has been compliant with his current medication regimen without any adverse side effects.  Denies any recent hospitalizations or visits to the emergency department.  ROS: 10 point review of systems has been reviewed and considered negative with exception was been listed in the HPI  Studies Reviewed      [facility] 09/19/2023   Ost LM lesion is 30% stenosed.   1st Diag lesion is 40% stenosed.   The left ventricular systolic function is normal.   LV end diastolic pressure is  normal.   The left ventricular ejection fraction is 55-65% by visual estimate.   1.  Mild to moderate nonobstructive coronary artery disease. 2.  Normal LV systolic function and high normal left ventricular end-diastolic pressure.   Recommendations: Elevated troponin is likely due to supply demand mismatch.  Recommend medical therapy.  I discontinued heparin  drip. Risk  Assessment/Calculations           Physical Exam VS:  BP 106/78 (BP Location: Left Arm, Patient Position: Sitting, Cuff Size: Large)   Pulse 64   Ht 6' (1.829 m)   Wt 187 lb (84.8 kg)   SpO2 97%   BMI 25.36 kg/m        Wt Readings from Last 3 Encounters:  10/12/23 187 lb (84.8 kg)  10/07/23 187 lb 6.4 oz (85 kg)  09/26/23 187 lb 4 oz (84.9 kg)    GEN: Well nourished, well developed in no acute distress NECK: No JVD; No carotid bruits CARDIAC: RRR, no murmurs, rubs, gallops RESPIRATORY:  Clear to auscultation without rales, wheezing or rhonchi  ABDOMEN: Soft, non-tender, non-distended EXTREMITIES:  No edema; No deformity   ASSESSMENT AND PLAN Nonobstructive coronary artery disease status post left heart catheterization on 09/19/2023.  Denies any recurrent chest discomfort.  He has been continued on aspirin  81 mg daily rosuvastatin  40 mg daily as well as clopidogrel  75 mg daily.  We have sent a prescription of lisinopril  to pharmacy of choice today.  Will continue with aggressive medical therapy for prevention of progressive coronary artery disease.  Primary hypertension with a blood pressure today 106/78.  Blood pressure has been well-controlled.  He has been continued on lisinopril  5 mg twice daily.  Recent BMP showed stable kidney function and electrolytes.  He has been continued on current regimen without changes being made today.  He also has upcoming labs scheduled with his PCP later in the year.  Carotid artery stenosis status post left carotid stenting on dual antiplatelet therapy with ongoing follow-up per vascular.  Continues to complain of neck pain around surgical area.  Recent follow-up with vascular plus overall reassuring.  He has upcoming follow-up in August.  Mixed hyperlipidemia with last LDL of 126.  Target LDL would be 55 or less.  Continued on rosuvastatin  40 mg daily with repeat lipid and hepatic panel upcoming at his PCPs.  At that time determination will be made if  medication changes are needed to get patient to goal.       Dispo: Patient return to clinic to see MD/APP in 3 months or sooner if needed for further evaluation.  Signed, Analyce Tavares, NP

## 2023-11-21 ENCOUNTER — Telehealth: Payer: Self-pay | Admitting: *Deleted

## 2023-11-21 NOTE — Telephone Encounter (Signed)
-----   Message from Veva JINNY Ferrari sent at 11/21/2023  2:57 PM EDT ----- Regarding: Lab orders for Mon, 8.18.25 Patient is scheduled for CPX labs, please order future labs, Thanks , Veva

## 2023-11-21 NOTE — Telephone Encounter (Signed)
 Please review recent labs.  The only thing I think he might need would be a PSA.

## 2023-11-22 NOTE — Telephone Encounter (Signed)
 Yes.  He does not need to come in for a fasting lab appointment, and we can draw that at the time of his office visit.

## 2023-11-22 NOTE — Telephone Encounter (Signed)
 Spoke with Tim and cancelled lab appointment on 12/05/2023.

## 2023-11-29 ENCOUNTER — Ambulatory Visit: Admitting: Cardiology

## 2023-12-05 ENCOUNTER — Other Ambulatory Visit: Payer: Self-pay | Admitting: Medical Genetics

## 2023-12-05 ENCOUNTER — Other Ambulatory Visit: Payer: No Typology Code available for payment source

## 2023-12-05 ENCOUNTER — Encounter: Payer: Self-pay | Admitting: Family Medicine

## 2023-12-11 ENCOUNTER — Encounter: Payer: Self-pay | Admitting: Family Medicine

## 2023-12-11 NOTE — Progress Notes (Signed)
 Juan Barba T. Farran Amsden, MD, CAQ Sports Medicine [facility] at [facility] [address] [place], [address]  Phone: [phone]  FAX: [phone]  Juan Salas - 62 y.o. male  MRN [id number]  Date of Birth: October 12, 1960  Date: 12/12/2023  PCP: Watt Mirza, MD  Referral: No ref. provider found  Chief Complaint  Patient presents with   Annual Exam   Patient Care Team: Watt Mirza, MD as PCP - General (Family Medicine) Darliss Rogue, MD as PCP - Cardiology (Cardiology) Subjective:   Juan Salas is a 63 y.o. pleasant patient who presents with the following:  Discussed the use of AI scribe software for clinical note transcription with the patient, who gave verbal consent to proceed.  He is a very pleasant 63 year old patient who I recall quite well.  On Sep 08, 2023, the patient had a stent placed in the left carotid artery for high-grade stenosis.  He also had an NSTEMI in early June, but cardiac catheterization showed clean coronaries in early June 2025.  History of Present Illness Juan Salas is a 63 year old male with a history of diverticulitis who presents for a physical exam and to discuss recent flare-ups of diverticulitis.  He has experienced recurrent episodes of diverticulitis over the past three weeks. Symptoms subside after two days of a liquid diet but recur the following week. The pain, described as more aggravating than severe, extends from the lower abdomen into the testicles. A history of severe pain led to a colonoscopy in his late forties, confirming diverticulosis.  He mentions a significant cardiovascular event in the past year, including a 95% carotid blockage, but reports no current chest or neck pain. He experiences some aches during physical activities such as lifting or core work, and occasionally while chewing. He is currently on Plavix  and a cholesterol medication, which was increased after  his cardiovascular events.  He has not received the COVID vaccine due to concerns about side effects.  He has had one shingles vaccine and is interested in receiving the second dose. He also discusses the pneumonia vaccine.  He maintains a healthy lifestyle, engaging in a four-day-a-week workout regimen that includes weightlifting and stationary biking. He reports a better diet, avoiding fast food and limiting ice cream intake since his procedure. He does not smoke, vape, or consume alcohol.   Preventative Health Maintenance Visit:  Health Maintenance Summary Reviewed and updated, unless pt declines services.  Tobacco History Reviewed. Alcohol: No concerns, no excessive use Exercise Habits: He is working out about 4 days a week with a combination of strength training and cardio STD concerns: no risk or activity to increase risk Drug Use: None  Prevnar 20 Shingrix  No. 2 Annual flu shot COVID vaccine - no  HTN: Tolerating all medications without side effects Stable and at goal No CP, no sob. No HA.  BP Readings from Last 3 Encounters:  12/12/23 110/78  10/12/23 106/78  10/07/23 (!) 142/83    Basic Metabolic Panel:    Component Value Date/Time   NA 138 10/10/2023 0808   K 4.7 10/10/2023 0808   CL 101 10/10/2023 0808   CO2 22 10/10/2023 0808   BUN 17 10/10/2023 0808   CREATININE 1.10 10/10/2023 0808   GLUCOSE 89 10/10/2023 0808   GLUCOSE 97 09/26/2023 1131   CALCIUM  9.6 10/10/2023 0808    Lipids: Doing well, stable. Tolerating meds fine with no SE. Panel reviewed with patient.  Lipids: Lab Results  Component Value Date   CHOL 242 (H) 08/25/2023   Lab Results  Component Value Date   HDL 56.80 08/25/2023   Lab Results  Component Value Date   LDLCALC 126 (H) 08/25/2023   Lab Results  Component Value Date   TRIG 296.0 (H) 08/25/2023   Lab Results  Component Value Date   CHOLHDL 4 08/25/2023    Lab Results  Component Value Date   ALT 26 08/25/2023   AST  22 08/25/2023   ALKPHOS 59 08/25/2023   BILITOT 0.4 08/25/2023     Health Maintenance  Topic Date Due   COVID-19 Vaccine (1) 12/28/2023 (Originally 08/07/1965)   INFLUENZA VACCINE  07/17/2024 (Originally 11/18/2023)   Colonoscopy  09/06/2026   DTaP/Tdap/Td (4 - Td or Tdap) 05/25/2033   Pneumococcal Vaccine: 50+ Years  Completed   Hepatitis C Screening  Completed   HIV Screening  Completed   Zoster Vaccines- Shingrix   Completed   Hepatitis B Vaccines 19-59 Average Risk  Aged Out   HPV VACCINES  Aged Out   Meningococcal B Vaccine  Aged Out   Immunization History  Administered Date(s) Administered   Hepatitis A, Ped/Adol-2 Dose 04/08/2010, 05/13/2010   Influenza, Seasonal, Injecte, Preservative Fre 05/26/2023   MMR 01/04/1995   PNEUMOCOCCAL CONJUGATE-20 12/12/2023   Td 01/04/1995   Tdap 04/08/2010, 05/26/2023   Zoster Recombinant(Shingrix ) 05/26/2023, 12/12/2023   Patient Active Problem List   Diagnosis Date Noted   NSTEMI (non-ST elevated myocardial infarction) (HCC) 09/18/2023    Priority: High   Left carotid artery stenosis 05/26/2023    Priority: High   Hyperlipidemia     Priority: Medium    Migraine headache     Priority: Medium    Essential hypertension 10/14/2020    Priority: Medium    Hypertensive urgency 09/18/2023   History of diverticulitis 05/26/2023   GERD without esophagitis 05/26/2023   Spinal stenosis    GAD (generalized anxiety disorder)     Past Medical History:  Diagnosis Date   Allergic rhinitis    Fibromyalgia    GAD (generalized anxiety disorder)    GERD without esophagitis 05/26/2023   History of diverticulitis 05/26/2023   History of kidney stones    Hyperlipidemia    Hypertension    Left carotid artery stenosis 05/26/2023   Migraine headache    Nocturia associated with benign prostatic hyperplasia 12/2017   Spinal stenosis     Past Surgical History:  Procedure Laterality Date   APPENDECTOMY  2000   CAROTID PTA/STENT INTERVENTION  N/A 09/08/2023   Procedure: CAROTID PTA/STENT INTERVENTION;  Surgeon: Marea Selinda RAMAN, MD;  Location: [facility] INVASIVE CV LAB;  Service: Cardiovascular;  Laterality: N/A;   COLONOSCOPY WITH PROPOFOL  N/A 09/06/2019   Procedure: COLONOSCOPY WITH PROPOFOL ;  Surgeon: Therisa Bi, MD;  Location: [facility] ENDOSCOPY;  Service: Gastroenterology;  Laterality: N/A;   CYSTOSCOPY WITH INSERTION OF UROLIFT N/A 01/17/2018   Procedure: CYSTOSCOPY WITH INSERTION OF UROLIFT;  Surgeon: Twylla Glendia BROCKS, MD;  Location: [facility] ORS;  Service: Urology;  Laterality: N/A;   CYSTOSCOPY WITH INSERTION OF UROLIFT  01/2018   CYSTOSCOPY WITH INSERTION OF UROLIFT Bilateral    HERNIA REPAIR Left 2000   double inguinal hernia repairs   HYDROCELE EXCISION Left 10/06/2021   Procedure: HYDROCELECTOMY ADULT GROIN/UNILATERAL SCROTAL APPROACH;  Surgeon: Twylla Glendia BROCKS, MD;  Location: [facility] ORS;  Service: Urology;  Laterality: Left;   LEFT HEART CATH AND CORONARY ANGIOGRAPHY N/A 09/19/2023   Procedure: LEFT HEART CATH AND CORONARY ANGIOGRAPHY;  Surgeon:  Darron Deatrice LABOR, MD;  Location: [facility] INVASIVE CV LAB;  Service: Cardiovascular;  Laterality: N/A;   LUMBAR LAMINECTOMY     laminectomy x 2. 1990, 1994   NASAL SEPTUM SURGERY  1978   repair of sinuses also   TESTICULAR TORSION REPAIR  2005   TONSILLECTOMY  1970    Family History  Problem Relation Age of Onset   Hypertension Mother    Arthritis Mother    Hyperlipidemia Mother    Miscarriages / [place] Mother    Stroke Mother    Varicose Veins Mother    Prostatitis Father    Hypertension Father    Hyperlipidemia Father    Hearing loss Maternal Grandfather    Hyperlipidemia Maternal Grandfather    Hypertension Maternal Grandfather    Kidney disease Maternal Grandfather    Stroke Maternal Grandfather    Arthritis Maternal Grandmother    Hyperlipidemia Maternal Grandmother    Hypertension Maternal Grandmother    Stroke Maternal Grandmother    Arthritis Paternal Grandfather     Hearing loss Paternal Grandfather    Hyperlipidemia Paternal Grandfather    Hypertension Paternal Grandfather    Stroke Paternal Grandfather    Hearing loss Paternal Grandmother     Social History   Social History Narrative   Non-smoker   No ETOH   No drugs   Married (Debbie)   Exercises at least 3x/week   Theatre manager (Retired)      [employer]   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    Past Medical History, Surgical History, Social History, Family History, Problem List, Medications, and Allergies have been reviewed and updated if relevant.  Review of Systems: Pertinent positives are listed above.  Otherwise, a full 14 point review of systems has been done in full and it is negative except where it is noted positive.  Objective:   BP 110/78   Pulse 73   Temp 98.2 F (36.8 C) (Temporal)   Ht 5' 10.5 (1.791 m)   Wt 186 lb 8 oz (84.6 kg)   SpO2 98%   BMI 26.38 kg/m  Ideal Body Weight: Weight in (lb) to have BMI = 25: 176.4  Ideal Body Weight: Weight in (lb) to have BMI = 25: 176.4 No results found.    12/12/2023    8:22 AM 08/25/2023    2:01 PM 05/26/2023    9:37 AM 02/07/2020    3:07 PM 08/16/2019   10:53 AM  Depression screen PHQ 2/9  Decreased Interest 0 0 0 0 0  Down, Depressed, Hopeless 0 0 0 0 0  PHQ - 2 Score 0 0 0 0 0  Altered sleeping     1  Tired, decreased energy     1  Change in appetite     0  Feeling bad or failure about yourself      0  Trouble concentrating     0  Moving slowly or fidgety/restless     0  Suicidal thoughts     0  PHQ-9 Score     2  Difficult doing work/chores     Not difficult at all     GEN: well developed, well nourished, no acute distress Eyes: conjunctiva and lids normal, PERRLA, EOMI ENT: TM clear, nares clear, oral exam WNL Neck: supple, no lymphadenopathy, no thyromegaly, no JVD Pulm: clear to auscultation and percussion, respiratory effort normal CV: regular rate and rhythm, S1-S2,  no murmur, rub or gallop, no  bruits, peripheral pulses normal and symmetric, no cyanosis, clubbing, edema or varicosities GI: soft, non-tender; no hepatosplenomegaly, masses; active bowel sounds all quadrants GU: deferred Lymph: no cervical, axillary or inguinal adenopathy MSK: gait normal, muscle tone and strength WNL, no joint swelling, effusions, discoloration, crepitus  SKIN: clear, good turgor, color WNL, no rashes, lesions, or ulcerations Neuro: normal mental status, normal strength, sensation, and motion Psych: alert; oriented to person, place and time, normally interactive and not anxious or depressed in appearance.  Physical Exam CARDIOVASCULAR: Heart sounds normal.  All labs reviewed with patient.  Lab Review:     Latest Ref Rng & Units 10/10/2023    8:08 AM 09/19/2023    4:02 AM 09/17/2023    8:40 PM  CBC EXTENDED  WBC 3.4 - 10.8 x10E3/uL 4.5  5.4  6.1   RBC 4.14 - 5.80 x10E6/uL 4.30  3.91  4.01   Hemoglobin 13.0 - 17.7 g/dL 86.5  87.8  87.5   HCT 37.5 - 51.0 % 41.1  36.6  36.7   Platelets 150 - 450 x10E3/uL 246  261  267   NEUT# 1.7 - 7.7 K/uL   3.3   Lymph# 0.7 - 4.0 K/uL   2.3        Latest Ref Rng & Units 10/10/2023    8:08 AM 09/26/2023   11:31 AM 09/19/2023    4:02 AM  BMP  Glucose 70 - 99 mg/dL 89  97  97   BUN 8 - 27 mg/dL 17  13  18    Creatinine 0.76 - 1.27 mg/dL 8.89  9.01  8.91   BUN/Creat Ratio 10 - 24 15     Sodium 134 - 144 mmol/L 138  136  137   Potassium 3.5 - 5.2 mmol/L 4.7  4.5  4.0   Chloride 96 - 106 mmol/L 101  101  106   CO2 20 - 29 mmol/L 22  27  25    Calcium  8.6 - 10.2 mg/dL 9.6  9.9  9.2        Latest Ref Rng & Units 08/25/2023    2:30 PM 07/01/2020    9:23 AM 02/22/2020   10:18 AM  Hepatic Function  Total Protein 6.0 - 8.3 g/dL 7.0  7.0  6.9   Albumin 3.5 - 5.2 g/dL 4.8  4.8  4.7   AST 0 - 37 U/L 22  32  29   ALT 0 - 53 U/L 26  43  32   Alk Phosphatase 39 - 117 U/L 59  61  58   Total Bilirubin 0.2 - 1.2 mg/dL 0.4  0.3  0.4     Lab  Results  Component Value Date   CHOL 242 (H) 08/25/2023   Lab Results  Component Value Date   HDL 56.80 08/25/2023   Lab Results  Component Value Date   LDLCALC 126 (H) 08/25/2023   Lab Results  Component Value Date   TRIG 296.0 (H) 08/25/2023   Lab Results  Component Value Date   CHOLHDL 4 08/25/2023   No results for input(s): PSA in the last 72 hours. No results found for: HCVAB No results found for: VD25OH   No results found for: HGBA1C Lab Results  Component Value Date   LDLCALC 126 (H) 08/25/2023   CREATININE 1.10 10/10/2023     Assessment and Plan:     ICD-10-CM   1. Healthcare maintenance  Z00.00     2. Mixed hyperlipidemia  E78.2 Lipid panel  3. Encounter for long-term (current) use of medications  Z79.899 Hepatic function panel    4. Prostate cancer screening  Z12.5 PSA, Total with Reflex to PSA, Free    5. Screening for diabetes mellitus  Z13.1 Hemoglobin A1c    6. Need for shingles vaccine  Z23 Varicella-zoster vaccine IM    7. Need for vaccination against Streptococcus pneumoniae  Z23 Pneumococcal conjugate vaccine 20-valent (Prevnar 20)      Assessment and Plan Assessment & Plan Adult Wellness Visit Routine wellness visit. Blood pressure well-controlled. Regular physical activity and improved diet noted. - Administer Prevnar 20 pneumonia vaccine. Shingrix  No. 2 - Check cholesterol levels. - Check PSA. - Check liver panel.  Diverticulitis of large intestine without perforation or abscess without bleeding Recurrent diverticulitis with abdominal pain. Symptoms improve with liquid diet. Possible smoldering infection considered. - Prescribe Augmentin  (amoxicillin /clavulanic acid) 1 tablet twice a day for 10 days. - Advise taking Augmentin  with food to reduce nausea. - Instruct to report any severe diarrhea for potential medication change.  History of NSTEMI No current chest pain. Regular physical activity. Continues on  Plavix .  Left carotid artery stenosis No current neck pain. Previous stent placement. Continues on Plavix . Neck discomfort not surgical.  Essential hypertension Blood pressure well-controlled with current medication. Monitors daily.  Mixed hyperlipidemia High cholesterol in May. On increased medication dose. Improved diet. Expect levels to decrease. - Check cholesterol levels.  Declines Covid vaccine  Health Maintenance Exam: The patient's preventative maintenance and recommended screening tests for an annual wellness exam were reviewed in full today. Brought up to date unless services declined.  Counselled on the importance of diet, exercise, and its role in overall health and mortality. The patient's FH and SH was reviewed, including their home life, tobacco status, and drug and alcohol status.  Follow-up in 1 year for physical exam or additional follow-up below.  Disposition: No follow-ups on file.  Meds ordered this encounter  Medications   amoxicillin -clavulanate (AUGMENTIN ) 875-125 MG tablet    Sig: Take 1 tablet by mouth 2 (two) times daily.    Dispense:  20 tablet    Refill:  0   Medications Discontinued During This Encounter  Medication Reason   lisinopril  (ZESTRIL ) 5 MG tablet Duplicate   Orders Placed This Encounter  Procedures   Varicella-zoster vaccine IM   Pneumococcal conjugate vaccine 20-valent (Prevnar 20)   Hepatic function panel   Lipid panel   PSA, Total with Reflex to PSA, Free   Hemoglobin A1c    Signed,  Adyn Serna T. Daekwon Beswick, MD   Allergies as of 12/12/2023       Reactions   Atorvastatin  Other (See Comments)   Joint pain Joint pain   Prednisone Other (See Comments)   After 3 days agitation and sleeplessness After 3 days agitation and sleeplessness   Verapamil  Nausea Only   Other reaction(s): Headache insomnia insomnia   Metoprolol  Palpitations   Per patient, can't tolerate. Per patient, can't tolerate.   Seasonal Ic [cholestatin]  Other (See Comments)   Sinus issues   Sulfa  Antibiotics Rash        Medication List        Accurate as of December 12, 2023  9:07 AM. If you have any questions, ask your nurse or doctor.          acetaminophen  500 MG tablet Commonly known as: TYLENOL  Take 500 mg by mouth every 4 (four) hours as needed for moderate pain or headache.   amoxicillin -clavulanate 875-125 MG  tablet Commonly known as: AUGMENTIN  Take 1 tablet by mouth 2 (two) times daily. Started by: Jacques Rohini Jaroszewski   aspirin  EC 81 MG tablet Take 81 mg by mouth daily. Swallow whole.   calcium  carbonate 500 MG chewable tablet Commonly known as: TUMS - dosed in mg elemental calcium  Chew 1 tablet by mouth 2 (two) times daily as needed for indigestion or heartburn.   clopidogrel  75 MG tablet Commonly known as: PLAVIX  Take 1 tablet (75 mg total) by mouth daily.   cyanocobalamin  2000 MCG tablet Take 2,000 mcg by mouth daily.   Fish Oil 1000 MG Caps Take 1,000 mg by mouth daily.   L-THEANINE PO Take 200 mg by mouth daily as needed (stress).   lisinopril  5 MG tablet Commonly known as: ZESTRIL  Take 1 tablet (5 mg total) by mouth 2 (two) times daily.   Magnesium  250 MG Tabs Take 250 mg by mouth daily as needed.   montelukast  10 MG tablet Commonly known as: SINGULAIR  Take 1 tablet (10 mg total) by mouth at bedtime.   multivitamin with minerals Tabs tablet Take 1 tablet by mouth daily.   naproxen 250 MG tablet Commonly known as: NAPROSYN Take 250 mg by mouth as needed.   nitroGLYCERIN  0.4 MG SL tablet Commonly known as: Nitrostat  Place 1 tablet (0.4 mg total) under the tongue every 5 (five) minutes as needed for chest pain.   pantoprazole  40 MG tablet Commonly known as: PROTONIX  Take 40 mg by mouth daily.   rosuvastatin  40 MG tablet Commonly known as: CRESTOR  Take 1 tablet (40 mg total) by mouth every morning.   tadalafil 20 MG tablet Commonly known as: CIALIS Take 20 mg by mouth daily as  needed.   Vitamin D3 50 MCG (2000 UT) Tabs Take 1 tablet by mouth daily at 6 (six) AM.

## 2023-12-12 ENCOUNTER — Encounter: Payer: Self-pay | Admitting: Family Medicine

## 2023-12-12 ENCOUNTER — Ambulatory Visit (INDEPENDENT_AMBULATORY_CARE_PROVIDER_SITE_OTHER): Payer: No Typology Code available for payment source | Admitting: Family Medicine

## 2023-12-12 VITALS — BP 110/78 | HR 73 | Temp 98.2°F | Ht 70.5 in | Wt 186.5 lb

## 2023-12-12 DIAGNOSIS — Z0000 Encounter for general adult medical examination without abnormal findings: Secondary | ICD-10-CM | POA: Diagnosis not present

## 2023-12-12 DIAGNOSIS — Z23 Encounter for immunization: Secondary | ICD-10-CM

## 2023-12-12 DIAGNOSIS — Z79899 Other long term (current) drug therapy: Secondary | ICD-10-CM

## 2023-12-12 DIAGNOSIS — E782 Mixed hyperlipidemia: Secondary | ICD-10-CM

## 2023-12-12 DIAGNOSIS — Z131 Encounter for screening for diabetes mellitus: Secondary | ICD-10-CM

## 2023-12-12 DIAGNOSIS — Z125 Encounter for screening for malignant neoplasm of prostate: Secondary | ICD-10-CM

## 2023-12-12 LAB — HEPATIC FUNCTION PANEL
ALT: 30 U/L (ref 0–53)
AST: 29 U/L (ref 0–37)
Albumin: 4.8 g/dL (ref 3.5–5.2)
Alkaline Phosphatase: 59 U/L (ref 39–117)
Bilirubin, Direct: 0.1 mg/dL (ref 0.0–0.3)
Total Bilirubin: 0.4 mg/dL (ref 0.2–1.2)
Total Protein: 7.2 g/dL (ref 6.0–8.3)

## 2023-12-12 LAB — LIPID PANEL
Cholesterol: 168 mg/dL (ref 0–200)
HDL: 60.2 mg/dL (ref >39.00)
LDL Cholesterol: 83 mg/dL (ref 0–99)
NonHDL: 108.05
Total CHOL/HDL Ratio: 3
Triglycerides: 124 mg/dL (ref 0.0–149.0)
VLDL: 24.8 mg/dL (ref 0.0–40.0)

## 2023-12-12 LAB — HEMOGLOBIN A1C: Hgb A1c MFr Bld: 6.1 % (ref 4.6–6.5)

## 2023-12-12 MED ORDER — AMOXICILLIN-POT CLAVULANATE 875-125 MG PO TABS
1.0000 | ORAL_TABLET | Freq: Two times a day (BID) | ORAL | 0 refills | Status: DC
Start: 2023-12-12 — End: 2024-01-11

## 2023-12-13 ENCOUNTER — Ambulatory Visit: Payer: Self-pay | Admitting: Family Medicine

## 2023-12-14 LAB — PSA, TOTAL WITH REFLEX TO PSA, FREE: PSA, Total: 0.9 ng/mL (ref <=4.0)

## 2023-12-21 ENCOUNTER — Other Ambulatory Visit
Admission: RE | Admit: 2023-12-21 | Discharge: 2023-12-21 | Disposition: A | Discharge status: Home / Self Care | Payer: Self-pay | Source: Ambulatory Visit | Attending: Medical Genetics | Admitting: Medical Genetics

## 2023-12-28 ENCOUNTER — Emergency Department

## 2023-12-28 ENCOUNTER — Other Ambulatory Visit: Payer: Self-pay

## 2023-12-28 ENCOUNTER — Emergency Department
Admission: EM | Admit: 2023-12-28 | Discharge: 2023-12-28 | Disposition: A | Discharge status: Home / Self Care | Attending: Emergency Medicine | Admitting: Emergency Medicine

## 2023-12-28 ENCOUNTER — Encounter: Payer: Self-pay | Admitting: *Deleted

## 2023-12-28 ENCOUNTER — Ambulatory Visit (INDEPENDENT_AMBULATORY_CARE_PROVIDER_SITE_OTHER)

## 2023-12-28 DIAGNOSIS — I2510 Atherosclerotic heart disease of native coronary artery without angina pectoris: Secondary | ICD-10-CM | POA: Diagnosis not present

## 2023-12-28 DIAGNOSIS — I10 Essential (primary) hypertension: Secondary | ICD-10-CM | POA: Diagnosis not present

## 2023-12-28 DIAGNOSIS — R55 Syncope and collapse: Secondary | ICD-10-CM | POA: Diagnosis present

## 2023-12-28 DIAGNOSIS — M542 Cervicalgia: Secondary | ICD-10-CM | POA: Insufficient documentation

## 2023-12-28 LAB — CBC
HCT: 41.6 % (ref 39.0–52.0)
Hemoglobin: 13.8 g/dL (ref 13.0–17.0)
MCH: 30.5 pg (ref 26.0–34.0)
MCHC: 33.2 g/dL (ref 30.0–36.0)
MCV: 91.8 fL (ref 80.0–100.0)
Platelets: 263 K/uL (ref 150–400)
RBC: 4.53 MIL/uL (ref 4.22–5.81)
RDW: 12.4 % (ref 11.5–15.5)
WBC: 5.5 K/uL (ref 4.0–10.5)
nRBC: 0 % (ref 0.0–0.2)

## 2023-12-28 LAB — TROPONIN I (HIGH SENSITIVITY)
Troponin I (High Sensitivity): 33 ng/L — ABNORMAL HIGH (ref <18)
Troponin I (High Sensitivity): 36 ng/L — ABNORMAL HIGH (ref <18)

## 2023-12-28 LAB — BASIC METABOLIC PANEL WITH GFR
Anion gap: 11 (ref 5–15)
BUN: 14 mg/dL (ref 8–23)
CO2: 25 mmol/L (ref 22–32)
Calcium: 9.9 mg/dL (ref 8.9–10.3)
Chloride: 101 mmol/L (ref 98–111)
Creatinine, Ser: 1.32 mg/dL — ABNORMAL HIGH (ref 0.61–1.24)
GFR, Estimated: >60 mL/min (ref >60)
Glucose, Bld: 106 mg/dL — ABNORMAL HIGH (ref 70–99)
Potassium: 3.8 mmol/L (ref 3.5–5.1)
Sodium: 137 mmol/L (ref 135–145)

## 2023-12-28 LAB — PROTIME-INR
INR: 0.9 (ref 0.8–1.2)
Prothrombin Time: 12.3 s (ref 11.4–15.2)

## 2023-12-28 MED ORDER — IOHEXOL 350 MG/ML SOLN
75.0000 mL | Freq: Once | INTRAVENOUS | Status: AC | PRN
  Administered 2023-12-28: 75 mL via INTRAVENOUS

## 2023-12-28 MED ORDER — LISINOPRIL 5 MG PO TABS
5.0000 mg | ORAL_TABLET | Freq: Once | ORAL | Status: AC
  Administered 2023-12-28: 5 mg via ORAL
  Filled 2023-12-28: qty 1

## 2023-12-28 MED ORDER — SODIUM CHLORIDE 0.9 % IV BOLUS
1000.0000 mL | Freq: Once | INTRAVENOUS | Status: AC
  Administered 2023-12-28: 1000 mL via INTRAVENOUS

## 2023-12-28 NOTE — Discharge Instructions (Signed)
 Please make sure to keep her self hydrated.  Your creatinine was noted to be slightly elevated today, please make sure to follow-up with your primary care doctor to get repeat labs to make sure that your kidney function is improving.

## 2023-12-28 NOTE — Progress Notes (Addendum)
  Patient arrived for total body skin exam. Prior to examination endorsed feeling lightheaded. Placed in trendelenburg. No LOC. Initial BP 140/92, pulse 70. Patient complaining of lightheadedness and left neck pain. Denied any cp, sob, arm pain, dizziness. States he feels weird.   Patient with recent NSTEMI on 09/18/23. Also with recent L carotid artery stenosis s/p stent 09/06/23.   Patient takes plavix  75mg  daily which he has this morning and aspirin  81mg  that he has yesterday PM.  No upper or lower extremity weakness Pupils equil and reactive  No facial droop or slurred speech BP recheck 186/92  Recommended patient go to the ED via EMS for evaluation. Patient denied EMS. Called patient's wife, Marval, who came to pick patient up. Escorted to car in wheelchair.   Called [facility] ED to notify.

## 2023-12-28 NOTE — ED Triage Notes (Addendum)
 Pt to triage via wheelchair.  Pt had a near syncope episode at the doctor's office today.  No chest pain or sob.  Pt alert  speech clear. Hx cad, carotid stent

## 2023-12-28 NOTE — ED Provider Notes (Signed)
 [facility] Provider Note    Event Date/Time   First MD Initiated Contact with Patient 12/28/23 1818     (approximate)   History   Near Syncope   HPI  Juan Salas is a 63 y.o. male with history of fibromyalgia, GAD, hypertension, hyperlipidemia, presenting with near syncopal episode.  He was at dermatologist office today, felt quite lightheaded, did not blackout but did note some tunnel vision.  Did not have any chest pain or shortness of breath, no headache, no weakness or numbness or vision changes, he denies any infectious symptoms.  No nausea vomiting or diarrhea.  States that he supposed to be keeping an eye on his blood pressure, is due to take his blood pressure medication tonight.  States that he has been having some left-sided neck pain over where his carotid stent is located.  Has had neck pain for a while but states that it is more noticeable today.  No recent trauma or falls.  On independent chart review, there was seen in dermatology today for total skin exam, felt some lightheadedness, initial blood pressure was 140/92, heart rate 70.  No chest pain or shortness of breath or arm pain.  States that he just felt queasy.  Patient had a carotid ultrasound done in June that showed a widely patent ICA stent, right carotid near normal vessels.  Also had left heart cath done in June that shows mild to moderate nonobstructive CAD, normal EF.  He was admitted in June for left neck pain.  That was when the left heart cath was done.     Physical Exam   Triage Vital Signs: ED Triage Vitals  Encounter Vitals Group     BP 12/28/23 1601 (!) 180/97     Girls Systolic BP Percentile --      Girls Diastolic BP Percentile --      Boys Systolic BP Percentile --      Boys Diastolic BP Percentile --      Pulse Rate 12/28/23 1601 92     Resp 12/28/23 1601 19     Temp 12/28/23 1601 98.3 F (36.8 C)     Temp Source 12/28/23 1601 Oral     SpO2 12/28/23 1601 100  %     Weight 12/28/23 1606 186 lb (84.4 kg)     Height 12/28/23 1606 6' (1.829 m)     Head Circumference --      Peak Flow --      Pain Score 12/28/23 1606 6     Pain Loc --      Pain Education --      Exclude from Growth Chart --     Most recent vital signs: Vitals:   12/28/23 2045 12/28/23 2125  BP: (!) 168/95 (!) 165/95  Pulse: 73   Resp: 18   Temp: 98 F (36.7 C)   SpO2: 100%      General: Awake, no distress.  CV:  Good peripheral perfusion.  Resp:  Normal effort.  No tachypnea or respiratory distress Abd:  No distention.  Soft nontender Other:  Pupils are equal, extraocular movements are intact, no facial droop or speech, full range of motion of neck is intact, he has no tenderness to his left neck, there is no fluctuance, erythema, induration, ecchymoses or swelling.  No focal weakness or numbness.   ED Results / Procedures / Treatments   Labs (all labs ordered are listed, but only abnormal results are displayed) Labs  Reviewed  BASIC METABOLIC PANEL WITH GFR - Abnormal; Notable for the following components:      Result Value   Glucose, Bld 106 (*)    Creatinine, Ser 1.32 (*)    All other components within normal limits  TROPONIN I (HIGH SENSITIVITY) - Abnormal; Notable for the following components:   Troponin I (High Sensitivity) 33 (*)    All other components within normal limits  TROPONIN I (HIGH SENSITIVITY) - Abnormal; Notable for the following components:   Troponin I (High Sensitivity) 36 (*)    All other components within normal limits  CBC  PROTIME-INR     EKG  EKG shows, sinus rhythm, rate 85, normal QRS, normal QTc, no obvious ischemic ST elevation, T wave flattening in 3,  flattening is new compared to prior   RADIOLOGY On my independent interpretation, CT without obvious acute occlusion   PROCEDURES:  Critical Care performed: No  Procedures   MEDICATIONS ORDERED IN ED: Medications  sodium chloride  0.9 % bolus 1,000 mL (1,000 mLs  Intravenous New Bag/Given 12/28/23 1846)  iohexol  (OMNIPAQUE ) 350 MG/ML injection 75 mL (75 mLs Intravenous Contrast Given 12/28/23 1940)  lisinopril  (ZESTRIL ) tablet 5 mg (5 mg Oral Given 12/28/23 2125)     IMPRESSION / MDM / ASSESSMENT AND PLAN / ED COURSE  I reviewed the triage vital signs and the nursing notes.                              Differential diagnosis includes, but is not limited to, arrhythmia, atypical ACS, dehydration, electrolyte derangements, carotid stent occlusion or malfunction, did consider infection but he has no infectious symptoms at this time.  No focal deficits on exam to suggest CVA.  Will get labs, EKG, troponin, chest x-ray.  IV fluids.  Will get a CT angio of his head and neck to assess for the carotid stent given that he was having some neck pain there.  I do not suspect any infection or hematoma given that there is no overlying lesions that he is not tender in that location.  We also given some IV fluids.  Will give him his nighttime blood pressure medications.  Patient's presentation is most consistent with acute presentation with potential threat to life or bodily function.  Independent interpretation of labs and imaging below.  On reassessment patient is a lot better, no recurrence of lightheadedness or near syncopal episode.  Blood pressures were downtrending without intervention.  Labs are reassuring, CT without acute occlusion.  Considered but no indication for inpatient admission at this time, he safe for outpatient management.  Discussed with him about imaging lab results including incidental findings, he will follow-up with primary care doctor next week to get repeat renal function labs done, encouraged hydration.  Discharged with strict return precautions.  Shared decision making done with patient and he is agreeable with this plan.  The patient is on the cardiac monitor to evaluate for evidence of arrhythmia and/or significant heart rate  changes.   Clinical Course as of 12/28/23 2127  Wed Dec 28, 2023  1821 Independent review of labs, troponin is mildly elevated, electrolytes not severely deranged, no leukocytosis, creatinine is mildly elevated.  INR is normal. [TT]  1826 DG Chest 2 View No active cardiopulmonary disease.  [TT]  1931 Troponin I (High Sensitivity)(!) Troponin is stable. [TT]  2018 CT Angio Head Neck W WO CM IMPRESSION: CT HEAD:  1. No acute intracranial abnormality.  2. Mild chronic microvascular ischemic disease for age.  CTA HEAD AND NECK:  1. Negative CTA for large vessel occlusion or other emergent finding. 2. Vascular stent traverses the left carotid bulb with patent flow through the stent. Focal narrowing at the mid aspect of the stent with up to approximately 50% stenosis by NASCET criteria, stable. 3. 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  Aortic Atherosclerosis (ICD10-I70.0).   [TT]    Clinical Course User Index [TT] Waymond Lorelle Cummins, MD     FINAL CLINICAL IMPRESSION(S) / ED DIAGNOSES   Final diagnoses:  Near syncope  Neck pain     Rx / DC Orders   ED Discharge Orders     None        Note:  This document was prepared using Dragon voice recognition software and may include unintentional dictation errors.    Waymond Lorelle Cummins, MD 12/28/23 2127

## 2023-12-28 NOTE — Patient Instructions (Signed)
 Recommend daily broad spectrum sunscreen SPF 30+ to sun-exposed areas, reapply every 2 hours as needed. Call for new or changing lesions.  Staying in the shade or wearing long sleeves, sun glasses (UVA+UVB protection) and wide brim hats (4-inch brim around the entire circumference of the hat) are also recommended for sun protection.     Melanoma ABCDEs  Melanoma is the most dangerous type of skin cancer, and is the leading cause of death from skin disease.  You are more likely to develop melanoma if you: Have light-colored skin, light-colored eyes, or red or blond hair Spend a lot of time in the sun Tan regularly, either outdoors or in a tanning bed Have had blistering sunburns, especially during childhood Have a close family member who has had a melanoma Have atypical moles or large birthmarks  Early detection of melanoma is key since treatment is typically straightforward and cure rates are extremely high if we catch it early.   The first sign of melanoma is often a change in a mole or a new dark spot.  The ABCDE system is a way of remembering the signs of melanoma.  A for asymmetry:  The two halves do not match. B for border:  The edges of the growth are irregular. C for color:  A mixture of colors are present instead of an even brown color. D for diameter:  Melanomas are usually (but not always) greater than 6mm - the size of a pencil eraser. E for evolution:  The spot keeps changing in size, shape, and color.  Please check your skin once per month between visits. You can use a small mirror in front and a large mirror behind you to keep an eye on the back side or your body.   If you see any new or changing lesions before your next follow-up, please call to schedule a visit.  Please continue daily skin protection including broad spectrum sunscreen SPF 30+ to sun-exposed areas, reapplying every 2 hours as needed when you're outdoors.    Due to recent changes in healthcare laws, you  may see results of your pathology and/or laboratory studies on MyChart before the doctors have had a chance to review them. We understand that in some cases there may be results that are confusing or concerning to you. Please understand that not all results are received at the same time and often the doctors may need to interpret multiple results in order to provide you with the best plan of care or course of treatment. Therefore, we ask that you please give us  2 business days to thoroughly review all your results before contacting the office for clarification. Should we see a critical lab result, you will be contacted sooner.   If You Need Anything After Your Visit  If you have any questions or concerns for your doctor, please call our main line at [phone] and press option 4 to reach your doctor's medical assistant. If no one answers, please leave a voicemail as directed and we will return your call as soon as possible. Messages left after 4 pm will be answered the following business day.   You may also send us  a message via MyChart. We typically respond to MyChart messages within 1-2 business days.  For prescription refills, please ask your pharmacy to contact our office. Our fax number is [phone].  If you have an urgent issue when the clinic is closed that cannot wait until the next business day, you can page your doctor at  the number below.    Please note that while we do our best to be available for urgent issues outside of office hours, we are not available 24/7.   If you have an urgent issue and are unable to reach us , you may choose to seek medical care at your doctor's office, retail clinic, urgent care center, or emergency room.  If you have a medical emergency, please immediately call 911 or go to the emergency department.  Pager Numbers  - Dr. Hester: [phone]  - Dr. Jackquline: [phone]  - Dr. Claudene: [phone]   - Dr. Raymund: [phone]  In the event of  inclement weather, please call our main line at [phone] for an update on the status of any delays or closures.  Dermatology Medication Tips: Please keep the boxes that topical medications come in in order to help keep track of the instructions about where and how to use these. Pharmacies typically print the medication instructions only on the boxes and not directly on the medication tubes.   If your medication is too expensive, please contact our office at [phone] option 4 or send us  a message through MyChart.   We are unable to tell what your co-pay for medications will be in advance as this is different depending on your insurance coverage. However, we may be able to find a substitute medication at lower cost or fill out paperwork to get insurance to cover a needed medication.   If a prior authorization is required to get your medication covered by your insurance company, please allow us  1-2 business days to complete this process.  Drug prices often vary depending on where the prescription is filled and some pharmacies may offer cheaper prices.  The website www.goodrx.com contains coupons for medications through different pharmacies. The prices here do not account for what the cost may be with help from insurance (it may be cheaper with your insurance), but the website can give you the price if you did not use any insurance.  - You can print the associated coupon and take it with your prescription to the pharmacy.  - You may also stop by our office during regular business hours and pick up a GoodRx coupon card.  - If you need your prescription sent electronically to a different pharmacy, notify our office through [facility] or by phone at [phone] option 4.     Si Usted Necesita Algo Despus de Su Visita  Tambin puede enviarnos un mensaje a travs de Clinical cytogeneticist. Por lo general respondemos a los mensajes de MyChart en el transcurso de 1 a 2 das hbiles.  Para renovar  recetas, por favor pida a su farmacia que se ponga en contacto con nuestra oficina. Randi lakes de fax es [place] [phone].  Si tiene un asunto urgente cuando la clnica est cerrada y que no puede esperar hasta el siguiente da hbil, puede llamar/localizar a su doctor(a) al nmero que aparece a continuacin.   Por favor, tenga en cuenta que aunque hacemos todo lo posible para estar disponibles para asuntos urgentes fuera del horario de [place], no estamos disponibles las 24 horas del da, los 7 [address]  [address] de la [place].   Si tiene un problema urgente y no puede comunicarse con nosotros, puede optar por buscar atencin mdica  en el consultorio de su doctor(a), en una clnica privada, en un centro de atencin urgente o en una sala de emergencias.  Si tiene una emergencia mdica, por favor llame inmediatamente al 911 o vaya a  la sala de emergencias.  Nmeros de bper  - Dr. Hester: [phone]  - Dra. Jackquline: [phone]  - Dr. Claudene: [phone]  - Dra. Kitts: [phone]  En caso de inclemencias del [place], por favor llame a nuestra lnea principal al [phone] para una actualizacin sobre el estado de cualquier retraso o cierre.  Consejos para la medicacin en dermatologa: Por favor, guarde las cajas en las que vienen los medicamentos de uso tpico para ayudarle a seguir las instrucciones sobre dnde y cmo usarlos. Las farmacias generalmente imprimen las instrucciones del medicamento slo en las cajas y no directamente en los tubos del [place].   Si su medicamento es muy caro, por favor, pngase en contacto con landry rieger llamando al [phone] y presione la opcin 4 o envenos un mensaje a travs de Clinical cytogeneticist.   No podemos decirle cul ser su copago por los medicamentos por adelantado ya que esto es diferente dependiendo de la cobertura de su seguro. Sin embargo, es posible que podamos encontrar un medicamento sustituto a Audiological scientist un formulario para que el  seguro cubra el medicamento que se considera necesario.   Si se requiere una autorizacin previa para que su compaa de seguros malta su medicamento, por favor permtanos de 1 a 2 das hbiles para completar este proceso.  Los precios de los medicamentos varan con frecuencia dependiendo del Environmental consultant de dnde se surte la receta y alguna farmacias pueden ofrecer precios ms baratos.  El sitio web www.goodrx.com tiene cupones para medicamentos de Health and safety inspector. Los precios aqu no tienen en cuenta lo que podra costar con la ayuda del seguro (puede ser ms barato con su seguro), pero el sitio web puede darle el precio si no utiliz Tourist information centre manager.  - Puede imprimir el cupn correspondiente y llevarlo con su receta a la farmacia.  - Tambin puede pasar por nuestra oficina durante el horario de atencin regular y Education officer, museum una tarjeta de cupones de GoodRx.  - Si necesita que su receta se enve electrnicamente a una farmacia diferente, informe a nuestra oficina a travs de MyChart de [place] o por telfono llamando al [phone] y presione la opcin 4.

## 2023-12-29 ENCOUNTER — Encounter (INDEPENDENT_AMBULATORY_CARE_PROVIDER_SITE_OTHER): Payer: Self-pay

## 2023-12-29 ENCOUNTER — Encounter: Payer: Self-pay | Admitting: Cardiology

## 2023-12-30 LAB — GENECONNECT MOLECULAR SCREEN: Genetic Analysis Overall Interpretation: NEGATIVE

## 2024-01-02 NOTE — Telephone Encounter (Signed)
 Let's see if we can bring him in just a bit earlier, if there are available times

## 2024-01-02 NOTE — Telephone Encounter (Signed)
 I still would just so we can compare apple to apples, given that his ct says he has a 50% stenosis of his stent

## 2024-01-03 ENCOUNTER — Other Ambulatory Visit (INDEPENDENT_AMBULATORY_CARE_PROVIDER_SITE_OTHER): Payer: Self-pay | Admitting: Vascular Surgery

## 2024-01-03 ENCOUNTER — Encounter: Payer: Self-pay | Admitting: Family Medicine

## 2024-01-03 DIAGNOSIS — I6523 Occlusion and stenosis of bilateral carotid arteries: Secondary | ICD-10-CM

## 2024-01-04 ENCOUNTER — Ambulatory Visit

## 2024-01-04 DIAGNOSIS — L820 Inflamed seborrheic keratosis: Secondary | ICD-10-CM

## 2024-01-04 DIAGNOSIS — L814 Other melanin hyperpigmentation: Secondary | ICD-10-CM | POA: Diagnosis not present

## 2024-01-04 DIAGNOSIS — L821 Other seborrheic keratosis: Secondary | ICD-10-CM | POA: Diagnosis not present

## 2024-01-04 DIAGNOSIS — W908XXA Exposure to other nonionizing radiation, initial encounter: Secondary | ICD-10-CM

## 2024-01-04 DIAGNOSIS — L578 Other skin changes due to chronic exposure to nonionizing radiation: Secondary | ICD-10-CM

## 2024-01-04 DIAGNOSIS — D229 Melanocytic nevi, unspecified: Secondary | ICD-10-CM

## 2024-01-04 DIAGNOSIS — D1801 Hemangioma of skin and subcutaneous tissue: Secondary | ICD-10-CM | POA: Diagnosis not present

## 2024-01-04 DIAGNOSIS — Z1283 Encounter for screening for malignant neoplasm of skin: Secondary | ICD-10-CM

## 2024-01-04 NOTE — Progress Notes (Signed)
 Subjective   Juan Salas is a 63 y.o. male who presents for the following: Total body skin exam for skin cancer screening and mole check. The patient has spots, moles and lesions to be evaluated, some may be new or changing and the patient may have concern these could be cancer.. Patient is new patient (Was supposed to be seen last week but sent to ER, exam not done)   Today patient reports: Spot at left lower leg, one at right buttock that itches. Bump at right lower leg near ankle, comes and goes and would itch but is clear today.  Review of Systems:    No other skin or systemic complaints except as noted in HPI or Assessment and Plan.  The following portions of the chart were reviewed this encounter and updated as appropriate: medications, allergies, medical history  Relevant Medical History:  n/a   Objective  Well appearing patient in no apparent distress; mood and affect are within normal limits. Examination was performed of the: Full Skin Examination: scalp, head, eyes, ears, nose, lips, neck, chest, axillae, abdomen, back, buttocks, bilateral upper extremities, bilateral lower extremities, hands, feet, fingers, toes, fingernails, and toenails.   Examination notable for: Angioma(s): Scattered red vascular papule(s)  , Lentigo/lentigines: Scattered pigmented macules that are tan to brown in color and are somewhat non-uniform in shape and concentrated in the sun-exposed areas, Seborrheic Keratosis(es): Stuck-on appearing keratotic papule(s) on the trunk, some  irritated with redness, crusting, edema, and/or partial avulsion, Actinic Damage/Elastosis: chronic sun damage: dyspigmentation, telangiectasia, and wrinkling  Examination limited by: Undergarments and Patient deferred removal     back x 1, L leg x 1, R buttock x 1 (3) Erythematous stuck-on, waxy papule or plaque  Assessment & Plan   SKIN CANCER SCREENING PERFORMED TODAY.  BENIGN SKIN FINDINGS  - Lentigines  -  Seborrheic keratoses  - Hemangiomas   - Nevus/Multiple Benign Nevi - Reassurance provided regarding the benign appearance of lesions noted on exam today; no treatment is indicated in the absence of symptoms/changes. - Reinforced importance of photoprotective strategies including liberal and frequent sunscreen use of a broad-spectrum SPF 30 or greater, use of protective clothing, and sun avoidance for prevention of cutaneous malignancy and photoaging.  Counseled patient on the importance of regular self-skin monitoring as well as routine clinical skin examinations as scheduled.   ACTINIC DAMAGE - Chronic condition, secondary to cumulative UV/sun exposure - Recommend daily broad spectrum sunscreen SPF 30+ to sun-exposed areas, reapply every 2 hours as needed.  - Staying in the shade or wearing long sleeves, sun glasses (UVA+UVB protection) and wide brim hats (4-inch brim around the entire circumference of the hat) are also recommended for sun protection.  - Call for new or changing lesions.   Procedures, orders, diagnosis for this visit:  INFLAMED SEBORRHEIC KERATOSIS (3) back x 1, L leg x 1, R buttock x 1 (3) Symptomatic, irritating, patient would like treated.  Benign-appearing.  Call clinic for new or changing lesions.   Destruction of lesion - back x 1, L leg x 1, R buttock x 1 (3) Complexity: simple   Destruction method: cryotherapy   Informed consent: discussed and consent obtained   Timeout:  patient name, date of birth, surgical site, and procedure verified Lesion destroyed using liquid nitrogen: Yes   Region frozen until ice ball extended beyond lesion: Yes   Cryo cycles: 1 or 2. Outcome: patient tolerated procedure well with no complications   Post-procedure details: wound care  instructions given     Inflamed seborrheic keratosis -     Destruction of lesion    Return to clinic: Return in about 1 year (around 01/03/2025) for with Dr. Raymund.  Juan Salas, RMA, am  acting as scribe for Lauraine JAYSON Raymund, MD .   Documentation: I have reviewed the above documentation for accuracy and completeness, and I agree with the above.  Lauraine JAYSON Raymund, MD

## 2024-01-04 NOTE — Telephone Encounter (Signed)
 Unable to reach patient by phone and left v/m requesting call back at [phone]. Need additional info thru triaging pt; pt can be triaged thru E2C2. And pt is to have FU appt after recent ED visit. Also will my chart pt. Sending note to Dr Watt Watt pool and [facility] triage.

## 2024-01-04 NOTE — Patient Instructions (Addendum)

## 2024-01-06 ENCOUNTER — Encounter (INDEPENDENT_AMBULATORY_CARE_PROVIDER_SITE_OTHER): Payer: Self-pay | Admitting: Nurse Practitioner

## 2024-01-06 ENCOUNTER — Ambulatory Visit (INDEPENDENT_AMBULATORY_CARE_PROVIDER_SITE_OTHER)

## 2024-01-06 ENCOUNTER — Ambulatory Visit (INDEPENDENT_AMBULATORY_CARE_PROVIDER_SITE_OTHER): Admitting: Nurse Practitioner

## 2024-01-06 VITALS — BP 123/76 | HR 62 | Ht 72.0 in | Wt 189.0 lb

## 2024-01-06 DIAGNOSIS — E785 Hyperlipidemia, unspecified: Secondary | ICD-10-CM

## 2024-01-06 DIAGNOSIS — I10 Essential (primary) hypertension: Secondary | ICD-10-CM

## 2024-01-06 DIAGNOSIS — I6523 Occlusion and stenosis of bilateral carotid arteries: Secondary | ICD-10-CM

## 2024-01-06 DIAGNOSIS — I6522 Occlusion and stenosis of left carotid artery: Secondary | ICD-10-CM | POA: Diagnosis not present

## 2024-01-06 MED ORDER — CLOPIDOGREL BISULFATE 75 MG PO TABS: 75.0000 mg | ORAL_TABLET | Freq: Every day | ORAL | 6 refills | Status: AC

## 2024-01-06 NOTE — Progress Notes (Signed)
 Subjective:    Patient ID: Juan Salas, male    DOB: 30-Oct-1960, 63 y.o.   MRN: [id number] Chief Complaint  Patient presents with   Follow-up    Patient returns today for routine follow-up of his left ICA stenosis.  He recently had a left carotid stent placed on 09/08/2023.  Immediately following his stent placement he had an episode of significant hypertension which resolved with medication changes.  However he again had another recent episode of hypertension with symptoms to the emergency room.  He notes that his episodes became better under control once he was able to take his lisinopril .  During that time he was hospitalized he underwent CT scan which showed possible 50% stenosis of the stent area but this is actually consistent with his previous CT scan.  Today he had noninvasive studies which show 1 to 35% stenosis of the left ICA.  His stent is actually widely patent and velocities are fairly consistent with what he had previously.  He also had no significant stenosis noted on the right ICA.  He has antegrade flow in the bilateral vertebral arteries with no significant stenosis of the bilateral subclavian arteries.    Review of Systems  Neurological:  Positive for light-headedness.  All other systems reviewed and are negative.      Objective:   Physical Exam Vitals reviewed.  HENT:     Head: Normocephalic.  Cardiovascular:     Rate and Rhythm: Normal rate.     Pulses: Normal pulses.  Pulmonary:     Effort: Pulmonary effort is normal.  Skin:    General: Skin is warm and dry.  Neurological:     Mental Status: He is alert and oriented to person, place, and time.  Psychiatric:        Mood and Affect: Mood normal.        Behavior: Behavior normal.        Thought Content: Thought content normal.        Judgment: Judgment normal.     BP 123/76   Pulse 62   Ht 6' (1.829 m)   Wt 189 lb (85.7 kg)   BMI 25.63 kg/m   Past Medical History:  Diagnosis Date   Allergic  rhinitis    Fibromyalgia    GAD (generalized anxiety disorder)    GERD without esophagitis 05/26/2023   History of diverticulitis 05/26/2023   History of kidney stones    Hyperlipidemia    Hypertension    Left carotid artery stenosis 05/26/2023   Migraine headache    Nocturia associated with benign prostatic hyperplasia 12/2017   Spinal stenosis     Social History   Socioeconomic History   Marital status: Married    Spouse name: Debbie   Number of children: 2   Years of education: Not on file   Highest education level: Master's degree (e.g., MA, MS, MEng, MEd, MSW, MBA)  Occupational History   Occupation: Engineer, mining: [employer]  Tobacco Use   Smoking status: Never   Smokeless tobacco: Never  Vaping Use   Vaping status: Never Used  Substance and Sexual Activity   Alcohol use: Not Currently   Drug use: Never   Sexual activity: Not Currently  Other Topics Concern   Not on file  Social History Narrative   Non-smoker   No ETOH   No drugs   Married (Debbie)   Exercises at least 3x/week   Theatre manager (Retired)  Pickleball    Basketball   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   Social Drivers of Corporate investment banker Strain: Low Risk  (12/08/2023)   Overall Financial Resource Strain (CARDIA)    Difficulty of Paying Living Expenses: Not very hard  Food Insecurity: No Food Insecurity (12/08/2023)   Hunger Vital Sign    Worried About Running Out of Food in the Last Year: Never true    Ran Out of Food in the Last Year: Never true  Transportation Needs: No Transportation Needs (12/08/2023)   PRAPARE - Administrator, Civil Service (Medical): No    Lack of Transportation (Non-Medical): No  Physical Activity: Sufficiently Active (12/08/2023)   Exercise Vital Sign    Days of Exercise per Week: 4 days    Minutes of Exercise per Session: 40 min  Stress: No Stress Concern Present (12/08/2023)   [employer] of  Occupational Health - Occupational Stress Questionnaire    Feeling of Stress: Only a little  Social Connections: Moderately Isolated (12/08/2023)   Social Connection and Isolation Panel    Frequency of Communication with Friends and Family: Twice a week    Frequency of Social Gatherings with Friends and Family: Once a week    Attends Religious Services: Never    Database administrator or Organizations: No    Attends Engineer, structural: Not on file    Marital Status: Married  Catering manager Violence: Not At Risk (09/18/2023)   Humiliation, Afraid, Rape, and Kick questionnaire    Fear of Current or Ex-Partner: No    Emotionally Abused: No    Physically Abused: No    Sexually Abused: No    Past Surgical History:  Procedure Laterality Date   APPENDECTOMY  2000   CAROTID PTA/STENT INTERVENTION N/A 09/08/2023   Procedure: CAROTID PTA/STENT INTERVENTION;  Surgeon: Marea Selinda RAMAN, MD;  Location: [facility] INVASIVE CV LAB;  Service: Cardiovascular;  Laterality: N/A;   COLONOSCOPY WITH PROPOFOL  N/A 09/06/2019   Procedure: COLONOSCOPY WITH PROPOFOL ;  Surgeon: Therisa Bi, MD;  Location: [facility] ENDOSCOPY;  Service: Gastroenterology;  Laterality: N/A;   CYSTOSCOPY WITH INSERTION OF UROLIFT N/A 01/17/2018   Procedure: CYSTOSCOPY WITH INSERTION OF UROLIFT;  Surgeon: Twylla Glendia BROCKS, MD;  Location: [facility] ORS;  Service: Urology;  Laterality: N/A;   CYSTOSCOPY WITH INSERTION OF UROLIFT  01/2018   CYSTOSCOPY WITH INSERTION OF UROLIFT Bilateral    HERNIA REPAIR Left 2000   double inguinal hernia repairs   HYDROCELE EXCISION Left 10/06/2021   Procedure: HYDROCELECTOMY ADULT GROIN/UNILATERAL SCROTAL APPROACH;  Surgeon: Twylla Glendia BROCKS, MD;  Location: [facility] ORS;  Service: Urology;  Laterality: Left;   LEFT HEART CATH AND CORONARY ANGIOGRAPHY N/A 09/19/2023   Procedure: LEFT HEART CATH AND CORONARY ANGIOGRAPHY;  Surgeon: Darron Deatrice LABOR, MD;  Location: [facility] INVASIVE CV LAB;  Service: Cardiovascular;   Laterality: N/A;   LUMBAR LAMINECTOMY     laminectomy x 2. 1990, 1994   NASAL SEPTUM SURGERY  1978   repair of sinuses also   TESTICULAR TORSION REPAIR  2005   TONSILLECTOMY  1970    Family History  Problem Relation Age of Onset   Hypertension Mother    Arthritis Mother    Hyperlipidemia Mother    Miscarriages / [place] Mother    Stroke Mother    Varicose Veins Mother    Prostatitis Father    Hypertension Father    Hyperlipidemia Father    Hearing loss Maternal Actor  Hyperlipidemia Maternal Grandfather    Hypertension Maternal Grandfather    Kidney disease Maternal Grandfather    Stroke Maternal Grandfather    Arthritis Maternal Grandmother    Hyperlipidemia Maternal Grandmother    Hypertension Maternal Grandmother    Stroke Maternal Grandmother    Arthritis Paternal Grandfather    Hearing loss Paternal Grandfather    Hyperlipidemia Paternal Grandfather    Hypertension Paternal Grandfather    Stroke Paternal Grandfather    Hearing loss Paternal Grandmother     Allergies  Allergen Reactions   Atorvastatin  Other (See Comments)    Joint pain Joint pain    Prednisone Other (See Comments)    After 3 days agitation and sleeplessness After 3 days agitation and sleeplessness    Verapamil  Nausea Only    Other reaction(s): Headache insomnia insomnia    Metoprolol  Palpitations    Per patient, can't tolerate. Per patient, can't tolerate.    Seasonal Ic [Cholestatin] Other (See Comments)    Sinus issues   Sulfa  Antibiotics Rash       Latest Ref Rng & Units 12/28/2023    4:09 PM 10/10/2023    8:08 AM 09/19/2023    4:02 AM  CBC  WBC 4.0 - 10.5 K/uL 5.5  4.5  5.4   Hemoglobin 13.0 - 17.0 g/dL 86.1  86.5  87.8   Hematocrit 39.0 - 52.0 % 41.6  41.1  36.6   Platelets 150 - 400 K/uL 263  246  261       CMP     Component Value Date/Time   NA 137 12/28/2023 1609   NA 138 10/10/2023 0808   K 3.8 12/28/2023 1609   CL 101 12/28/2023 1609   CO2 25  12/28/2023 1609   GLUCOSE 106 (H) 12/28/2023 1609   BUN 14 12/28/2023 1609   BUN 17 10/10/2023 0808   CREATININE 1.32 (H) 12/28/2023 1609   CALCIUM  9.9 12/28/2023 1609   PROT 7.2 12/12/2023 0848   PROT 7.0 07/01/2020 0923   ALBUMIN 4.8 12/12/2023 0848   ALBUMIN 4.8 07/01/2020 0923   AST 29 12/12/2023 0848   ALT 30 12/12/2023 0848   ALKPHOS 59 12/12/2023 0848   BILITOT 0.4 12/12/2023 0848   BILITOT 0.3 07/01/2020 0923   GFR 81.71 09/26/2023 1131   EGFR 75 10/10/2023 0808   GFRNONAA >60 12/28/2023 1609     No results found.     Assessment & Plan:   1. Left carotid artery stenosis (Primary) Today noninvasive studies are consistent with the previous studies which show a widely patent left ICA stent.  He has episodes of hypertension are not necessarily consistent with stent placement as typically patients can have more hypotensive episodes.  We reviewed the CT scan that he had done with his recent emergency room visit as well as with the previous incident and they have a fairly consistent.  And notes a 50% stenosis at the midportion of the stent but I suspect this is overestimated given the artifact from the metal and the stent itself.  His studies today continue to show a widely patent stent with 1 to 39% stenosis of the left ICA with no significant of the right.  He also did not have any significant stenosis in the vertebral arteries with normal flow dynamics of the bilateral subclavian arteries.  I suspect that the episode he had may have been more of a vagal episode to some of the ongoing pain that he was having from other issues.  As regards  the pain in his neck and then this is also more inflammatory from the stent in place itself or this should overtime resolved.  Patient will follow-up in 6 months or sooner if issues arise.  2. Essential hypertension Continue antihypertensive medications as already ordered, these medications have been reviewed and there are no changes at this  time.  3. Hyperlipidemia, unspecified hyperlipidemia type Continue statin as ordered and reviewed, no changes at this time   Current Outpatient Medications on File Prior to Visit  Medication Sig Dispense Refill   acetaminophen  (TYLENOL ) 500 MG tablet Take 500 mg by mouth every 4 (four) hours as needed for moderate pain or headache.      aspirin  EC 81 MG tablet Take 81 mg by mouth daily. Swallow whole.     calcium  carbonate (TUMS - DOSED IN MG ELEMENTAL CALCIUM ) 500 MG chewable tablet Chew 1 tablet by mouth 2 (two) times daily as needed for indigestion or heartburn.     Cholecalciferol  (VITAMIN D3) 50 MCG (2000 UT) TABS Take 1 tablet by mouth daily at 6 (six) AM.     cyanocobalamin  2000 MCG tablet Take 2,000 mcg by mouth daily.     L-THEANINE PO Take 200 mg by mouth daily as needed (stress).     lisinopril  (ZESTRIL ) 5 MG tablet Take 1 tablet (5 mg total) by mouth 2 (two) times daily. 90 tablet 3   Magnesium  250 MG TABS Take 250 mg by mouth daily as needed.     montelukast  (SINGULAIR ) 10 MG tablet Take 1 tablet (10 mg total) by mouth at bedtime. 30 tablet 3   Multiple Vitamin (MULTIVITAMIN WITH MINERALS) TABS tablet Take 1 tablet by mouth daily.     naproxen (NAPROSYN) 250 MG tablet Take 250 mg by mouth as needed.     nitroGLYCERIN  (NITROSTAT ) 0.4 MG SL tablet Place 1 tablet (0.4 mg total) under the tongue every 5 (five) minutes as needed for chest pain. 90 tablet 3   Omega-3 Fatty Acids (FISH OIL) 1000 MG CAPS Take 1,000 mg by mouth daily.     pantoprazole  (PROTONIX ) 40 MG tablet Take 40 mg by mouth daily.     rosuvastatin  (CRESTOR ) 40 MG tablet Take 1 tablet (40 mg total) by mouth every morning. 90 tablet 3   tadalafil (CIALIS) 20 MG tablet Take 20 mg by mouth daily as needed.     amoxicillin -clavulanate (AUGMENTIN ) 875-125 MG tablet Take 1 tablet by mouth 2 (two) times daily. (Patient not taking: Reported on 01/06/2024) 20 tablet 0   No current facility-administered medications on file  prior to visit.    There are no Patient Instructions on file for this visit. No follow-ups on file.   Thayer Inabinet E Anaiah Mcmannis, NP

## 2024-01-07 ENCOUNTER — Other Ambulatory Visit: Payer: Self-pay

## 2024-01-07 ENCOUNTER — Emergency Department

## 2024-01-07 ENCOUNTER — Emergency Department
Admission: EM | Admit: 2024-01-07 | Discharge: 2024-01-07 | Disposition: A | Discharge status: Home / Self Care | Attending: Emergency Medicine | Admitting: Emergency Medicine

## 2024-01-07 ENCOUNTER — Encounter: Payer: Self-pay | Admitting: *Deleted

## 2024-01-07 DIAGNOSIS — R42 Dizziness and giddiness: Secondary | ICD-10-CM | POA: Insufficient documentation

## 2024-01-07 DIAGNOSIS — I10 Essential (primary) hypertension: Secondary | ICD-10-CM | POA: Diagnosis not present

## 2024-01-07 DIAGNOSIS — R002 Palpitations: Secondary | ICD-10-CM | POA: Diagnosis not present

## 2024-01-07 LAB — CBC
HCT: 43.8 % (ref 39.0–52.0)
Hemoglobin: 14.9 g/dL (ref 13.0–17.0)
MCH: 31 pg (ref 26.0–34.0)
MCHC: 34 g/dL (ref 30.0–36.0)
MCV: 91.3 fL (ref 80.0–100.0)
Platelets: 242 K/uL (ref 150–400)
RBC: 4.8 MIL/uL (ref 4.22–5.81)
RDW: 12.6 % (ref 11.5–15.5)
WBC: 5.2 K/uL (ref 4.0–10.5)
nRBC: 0 % (ref 0.0–0.2)

## 2024-01-07 LAB — BASIC METABOLIC PANEL WITH GFR
Anion gap: 12 (ref 5–15)
BUN: 11 mg/dL (ref 8–23)
CO2: 26 mmol/L (ref 22–32)
Calcium: 9.7 mg/dL (ref 8.9–10.3)
Chloride: 99 mmol/L (ref 98–111)
Creatinine, Ser: 1.17 mg/dL (ref 0.61–1.24)
GFR, Estimated: >60 mL/min (ref >60)
Glucose, Bld: 105 mg/dL — ABNORMAL HIGH (ref 70–99)
Potassium: 4.3 mmol/L (ref 3.5–5.1)
Sodium: 137 mmol/L (ref 135–145)

## 2024-01-07 LAB — TROPONIN I (HIGH SENSITIVITY)
Troponin I (High Sensitivity): 40 ng/L — ABNORMAL HIGH (ref <18)
Troponin I (High Sensitivity): 48 ng/L — ABNORMAL HIGH (ref <18)

## 2024-01-07 NOTE — ED Notes (Addendum)
 First nurse note: GEMS from home for 3-4 months of high BP and intermittent tachycardia. Pt was eating breakfast this AM and suddenly felt weak, dizzy and was having blurry vision lasting 10-15 min which resolved. Pt has had similar episodes in the past when had high BP. Pt checked BP and SBP was in 200s. Pt took extra dose of Lisinopril  at 0930. Pt still has generalized weakness.  Had L carotid stent place 09/16/23 and has had 'a couple NSTEMIs' since then and also other similar episodes of weakness and dizziness.  EMS VS: 218/94 L arm, 202 92 R arm, HR 94, RR 22, 99% RA, CBG 106. EKG was normal.

## 2024-01-07 NOTE — ED Provider Notes (Signed)
 [facility] Provider Note   Event Date/Time   First MD Initiated Contact with Patient 01/07/24 1212     (approximate) History  Hypertension  HPI Juan Salas is a 63 y.o. male with a stated past medical history of hypertension, hyperlipidemia, migraines, generalized anxiety disorder, and fibromyalgia who presents complaining of a presyncopal episode that occurred this morning during breakfast.  Patient states that he felt lightheaded, weak, and saw the lights in his vision however did not lose consciousness.  Patient states that shortly thereafter EMS was called and patient's blood pressure was 207/97.  Patient's blood pressure did downtrend to 174/101 upon arrival.  Patient states that he took an extra dose of his blood pressure medication after seeing this high blood pressure prior to arrival.  Patient states that he had associated palpitations during this presyncopal event.  Incidentally, patient states that he is having aching pain over the left aspect of the neck where a carotid stent was placed months prior to arrival.  This aching pain is intermittent and does not correlate with these presyncopal symptoms however patient states that after this presyncopal event today he had worsening left-sided neck pain ROS: Patient currently denies any vision changes, tinnitus, difficulty speaking, facial droop, sore throat, chest pain, shortness of breath, abdominal pain, nausea/vomiting/diarrhea, dysuria, or weakness/numbness/paresthesias in any extremity   Physical Exam  Triage Vital Signs: ED Triage Vitals [01/07/24 1201]  Encounter Vitals Group     BP (!) 174/101     Girls Systolic BP Percentile      Girls Diastolic BP Percentile      Boys Systolic BP Percentile      Boys Diastolic BP Percentile      Pulse Rate 88     Resp 16     Temp 98.2 F (36.8 C)     Temp Source Oral     SpO2 100 %     Weight      Height      Head Circumference      Peak Flow      Pain  Score 0     Pain Loc      Pain Education      Exclude from Growth Chart    Most recent vital signs: Vitals:   01/07/24 1500 01/07/24 1530  BP: 130/84 (!) 159/86  Pulse: 75   Resp: 14   Temp:    SpO2: 98%    General: Awake, oriented x4. CV:  Good peripheral perfusion. Resp:  Normal effort. Abd:  No distention. Other:  Elderly overweight Caucasian male resting comfortably in no acute distress ED Results / Procedures / Treatments  Labs (all labs ordered are listed, but only abnormal results are displayed) Labs Reviewed  BASIC METABOLIC PANEL WITH GFR - Abnormal; Notable for the following components:      Result Value   Glucose, Bld 105 (*)    All other components within normal limits  TROPONIN I (HIGH SENSITIVITY) - Abnormal; Notable for the following components:   Troponin I (High Sensitivity) 40 (*)    All other components within normal limits  TROPONIN I (HIGH SENSITIVITY) - Abnormal; Notable for the following components:   Troponin I (High Sensitivity) 48 (*)    All other components within normal limits  CBC   EKG ED ECG REPORT I, Artist MARLA Kerns, the attending physician, personally viewed and interpreted this ECG. Date: 01/07/2024 EKG Time: 1201 Rate: 89 Rhythm: normal sinus rhythm QRS Axis: normal Intervals: normal  ST/T Wave abnormalities: normal Narrative Interpretation: no evidence of acute ischemia RADIOLOGY ED MD interpretation: 2 view chest x-ray interpreted by me shows no evidence of acute abnormalities including no pneumonia, pneumothorax, or widened mediastinum - All radiology independently interpreted and agree with radiology assessment Official radiology report(s): DG Chest 2 View Result Date: 01/07/2024 CLINICAL DATA:  Hypertension. Intermittent tachycardia. Weakness and dizziness this morning. EXAM: CHEST - 2 VIEW COMPARISON:  12/28/2023 FINDINGS: Lungs are adequately inflated and otherwise clear. Cardiomediastinal silhouette and remainder of the exam  is unchanged. IMPRESSION: No active cardiopulmonary disease. Electronically Signed   By: Toribio Agreste M.D.   On: 01/07/2024 13:06   PROCEDURES: Critical Care performed: No Procedures MEDICATIONS ORDERED IN ED: Medications - No data to display IMPRESSION / MDM / ASSESSMENT AND PLAN / ED COURSE  I reviewed the triage vital signs and the nursing notes.                             The patient is on the cardiac monitor to evaluate for evidence of arrhythmia and/or significant heart rate changes. Patient's presentation is most consistent with acute presentation with potential threat to life or bodily function. Patient is a 63 year old male with the above-stated past medical history who presents for presyncopal symptoms prior to arrival. DDx: Heart failure, intracranial hemorrhage, seizure, stroke, ACS, aortic dissection, malignant arrhythmia, GI bleed Plan: CBC, BMP, troponin, EKG, chest x-ray Results: EKG shows no evidence of STEMI.  No evidence of Brugada sign, delta wave, epsilon wave, significantly prolonged QTc, or malignant arrhythmia CBC, BMP show no evidence of acute abnormalities Troponin negative 40-48  Upon reassessment, patient informed of these negative results and agrees with plan for discharge at this time with PCP follow-up for further control of his blood pressure as necessary.  Patient's blood pressure has normalized at this time.  All questions were answered prior to discharge and patient given strict return precautions  Dispo: Discharge home with PCP follow-up   FINAL CLINICAL IMPRESSION(S) / ED DIAGNOSES   Final diagnoses:  Episodic lightheadedness  Hypertension, unspecified type  Palpitations   Rx / DC Orders   ED Discharge Orders          Ordered    Ambulatory referral to Cardiology       Comments: If you have not heard from the Cardiology office within the next 72 hours please call [phone].   01/07/24 1520           Note:  This document was  prepared using Dragon voice recognition software and may include unintentional dictation errors.   Jossie Artist POUR, MD 01/07/24 [phone]

## 2024-01-07 NOTE — ED Triage Notes (Addendum)
 Pt is here by ems due to hypertension.  Pt had a carotid stent placed in may.  No CP, no SOB. Pt is reporting an ache in the site of his left carotid stent but he was seen in clinic yesterday and had an ultrasound of the left carotid stent which was WNL.  Pt reports that he became flushed and felt hot and like he might faint.  Ems was called and BP at was 207/97 and here in triage it is 174/101. Pt takes BP med as prescribed. He took an extra dose today as he was told to take an extra dose when his BP is too high.

## 2024-01-09 ENCOUNTER — Other Ambulatory Visit (INDEPENDENT_AMBULATORY_CARE_PROVIDER_SITE_OTHER): Payer: Self-pay | Admitting: Nurse Practitioner

## 2024-01-09 DIAGNOSIS — I10 Essential (primary) hypertension: Secondary | ICD-10-CM

## 2024-01-09 NOTE — Telephone Encounter (Signed)
 Patient left message on the nurse line regarding this message requesting for [facility].

## 2024-01-09 NOTE — Telephone Encounter (Signed)
 Let's get him in with a renal artery duplex to see JD

## 2024-01-10 NOTE — Progress Notes (Signed)
 "    Caitlin Ainley T. Nicole Defino, MD, CAQ Sports Medicine [facility] at [facility] [address] [place], [address]  Phone: [phone]  FAX: [phone]  AVIYON HOCEVAR - 63 y.o. male  MRN [id number]  Date of Birth: [birth year]  Date: 01/11/2024  PCP: Watt Mirza, MD  Referral: Watt Mirza, MD  Chief Complaint  Patient presents with   Follow-up    ER 01/07/2024   Subjective:   SMOKEY MELOTT is a 63 y.o. very pleasant male patient with Body mass index is 26.66 kg/m. who presents with the following:  Discussed the use of AI scribe software for clinical note transcription with the patient, who gave verbal consent to proceed.  Patient presents with ongoing hypertension and intermittent passing out sensation.  He was feeling weak and dizzy and went to the emergency room on January 07, 2024.  At that time, his blood pressure was 210/100 when EMS assessed the patient.  He is currently only on lisinopril  5 mg that he is taking twice daily.   History of Present Illness Dal L Snyders [name] is a 63 year old male with hypertension and coronary artery disease who presents with episodes of high blood pressure and lightheadedness. He is accompanied by his family member, Debbie.  He experienced a significant episode of high blood pressure and lightheadedness on Saturday during breakfast with his family. The symptoms began suddenly, with visual disturbances such as 'spots' and 'dark edges,' along with sweating and paleness. His heart rate increased to 100 bpm, elevated from his usual resting rate of 50 bpm. Despite taking an extra dose of lisinopril , his blood pressure remained elevated at 207/97 mmHg at home and 217/97 mmHg in the ambulance. In the ER, his blood pressure eventually decreased by 4:30 PM without medication intervention.  He has a history of coronary artery disease with a 30-40% blockage, but no stents. He recently had carotid and renal  artery scans; the ultrasound technician told him the renal artery scan was 'great,' but the formal report was not yet available. He describes episodes of 'adrenal surges,' feeling like a wave going through him, even in non-stressful situations such as sitting at home or at a dermatologist's office.  He has been on lisinopril  5 mg twice daily since June, with instructions to take an additional dose if his blood pressure rises. He reports that his energy levels have been 'horrible' and notes a recent increase in the frequency of these episodes. He also mentions a history of elevated heart enzymes, with a troponin level of 48, but no heart attack was diagnosed.  His family history includes relatives who have died from stroke-related causes. He is currently taking Plavix  as part of his medication regimen. No associated anxiety or panic attacks.  Review of Systems is noted in the HPI, as appropriate  Patient Active Problem List   Diagnosis Date Noted   NSTEMI (non-ST elevated myocardial infarction) (HCC) 09/18/2023    Priority: High   Left carotid artery stenosis 05/26/2023    Priority: High   Hyperlipidemia     Priority: Medium    Essential hypertension 10/14/2020    Priority: Medium    History of non-ST elevation myocardial infarction (NSTEMI) 01/12/2024   History of diverticulitis 05/26/2023   GERD without esophagitis 05/26/2023   Spinal stenosis    GAD (generalized anxiety disorder)     Past Medical History:  Diagnosis Date   Allergic rhinitis    Fibromyalgia    GAD (generalized anxiety  disorder)    GERD without esophagitis 05/26/2023   History of diverticulitis 05/26/2023   History of kidney stones    Hyperlipidemia    Hypertension    Left carotid artery stenosis 05/26/2023   Migraine headache    Nocturia associated with benign prostatic hyperplasia 12/2017   Spinal stenosis     Past Surgical History:  Procedure Laterality Date   APPENDECTOMY  2000   CAROTID PTA/STENT  INTERVENTION N/A 09/08/2023   Procedure: CAROTID PTA/STENT INTERVENTION;  Surgeon: Marea Selinda RAMAN, MD;  Location: [facility] INVASIVE CV LAB;  Service: Cardiovascular;  Laterality: N/A;   COLONOSCOPY WITH PROPOFOL  N/A 09/06/2019   Procedure: COLONOSCOPY WITH PROPOFOL ;  Surgeon: Therisa Bi, MD;  Location: [facility] ENDOSCOPY;  Service: Gastroenterology;  Laterality: N/A;   CYSTOSCOPY WITH INSERTION OF UROLIFT N/A 01/17/2018   Procedure: CYSTOSCOPY WITH INSERTION OF UROLIFT;  Surgeon: Twylla Glendia BROCKS, MD;  Location: [facility] ORS;  Service: Urology;  Laterality: N/A;   CYSTOSCOPY WITH INSERTION OF UROLIFT  01/2018   CYSTOSCOPY WITH INSERTION OF UROLIFT Bilateral    HERNIA REPAIR Left 2000   double inguinal hernia repairs   HYDROCELE EXCISION Left 10/06/2021   Procedure: HYDROCELECTOMY ADULT GROIN/UNILATERAL SCROTAL APPROACH;  Surgeon: Twylla Glendia BROCKS, MD;  Location: [facility] ORS;  Service: Urology;  Laterality: Left;   LEFT HEART CATH AND CORONARY ANGIOGRAPHY N/A 09/19/2023   Procedure: LEFT HEART CATH AND CORONARY ANGIOGRAPHY;  Surgeon: Darron Deatrice LABOR, MD;  Location: [facility] INVASIVE CV LAB;  Service: Cardiovascular;  Laterality: N/A;   LUMBAR LAMINECTOMY     laminectomy x 2. 1990, 1994   NASAL SEPTUM SURGERY  1978   repair of sinuses also   TESTICULAR TORSION REPAIR  2005   TONSILLECTOMY  1970    Family History  Problem Relation Age of Onset   Hypertension Mother    Arthritis Mother    Hyperlipidemia Mother    Miscarriages / Stillbirths Mother    Stroke Mother    Varicose Veins Mother    Prostatitis Father    Hypertension Father    Hyperlipidemia Father    Hearing loss Maternal Grandfather    Hyperlipidemia Maternal Grandfather    Hypertension Maternal Grandfather    Kidney disease Maternal Grandfather    Stroke Maternal Grandfather    Arthritis Maternal Grandmother    Hyperlipidemia Maternal Grandmother    Hypertension Maternal Grandmother    Stroke Maternal Grandmother    Arthritis Paternal  Grandfather    Hearing loss Paternal Grandfather    Hyperlipidemia Paternal Grandfather    Hypertension Paternal Grandfather    Stroke Paternal Grandfather    Hearing loss Paternal Grandmother     Social History   Social History Narrative   Non-smoker   No ETOH   No drugs   Married (Debbie)   Exercises at least 3x/week   Theatre Manager (Retired)      [employer]   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 CV: RRR, no m/g/r  PULM: Normal respiratory rate, no accessory muscle use. No wheezes, crackles or rhonchi  PSYCH: Normally interactive.   Physical Exam VITALS: P- 80, BP- 170/88 SKIN: Large seborrheic keratosis on the  middle of the back, healing well.  Laboratory and Imaging Data:  Assessment and Plan:     ICD-10-CM   1. Essential hypertension  I10     2. Left carotid artery stenosis  I65.22     3. Postural dizziness with presyncope  R42    R55     4. History of non-ST elevation myocardial infarction (NSTEMI)  I25.2      Assessment & Plan Paroxysmal hypertensive episodes with presyncope and palpitations Episodes of elevated blood pressure with presyncope and palpitations. Differential includes possible heart arrhythmia or baroreceptor syndrome. Elevated troponin levels -Will defer to cardiology - Refer to cardiology for further evaluation, including consideration of a Holter monitor. - Maintain current lisinopril  dosage until cardiology evaluation.  My plan would be to increase lisinopril  dosing to a total of 20 mg.  He is seeing cardiology first thing in the morning, so I am going to hold off on making medication changes so they can give and their input.  Essential hypertension Blood pressure management with lisinopril  5 mg  twice daily. Recent episodes suggest need for reassessment of management strategy. - Continue lisinopril  5 mg twice daily until cardiology evaluation. - Consider increasing lisinopril  dosage to 10 mg twice daily based on cardiology input versus other blood pressure medication management.  Coronary artery disease Coronary artery disease with 30-40% stenosis. Recent episodes may be related to underlying cardiac condition. Recent heart catheterization shows nonobstructive coronary disease. - Continue current medications including clopidogrel   Occlusion and stenosis of left carotid artery Left carotid artery stenosis. Recent renal artery scan performed, awaiting formal report. - Follow up with vascular surgery as scheduled.  Seborrheic keratosis, middle back, healing Seborrheic keratosis on the middle back previously treated with cryotherapy. Lesion healing well.  If it does not fall off, we could always do additional cryotherapy.  Medication Management during today's office visit: Meds ordered this encounter  Medications   lisinopril  (ZESTRIL ) 5 MG tablet    Sig: Take 1 tablet (5 mg total) by mouth 2 (two) times daily.   Medications Discontinued During This Encounter  Medication Reason   amoxicillin -clavulanate (AUGMENTIN ) 875-125 MG tablet Completed Course   amoxicillin -clavulanate (AUGMENTIN ) 875-125 MG tablet Completed Course   lisinopril  (ZESTRIL ) 5 MG tablet     Orders placed today for conditions managed today: No orders of the defined types were placed in this encounter.   Disposition: No follow-ups on file.  [employer] One speech-to-text software was used for transcription in this dictation.  Possible transcriptional errors can occur using Animal nutritionist.   Signed,  Jacques DASEN. Garfield Coiner, MD   Outpatient Encounter Medications as of 01/11/2024  Medication Sig   acetaminophen  (TYLENOL ) 500 MG tablet Take 500 mg by mouth every 4 (four) hours as needed for moderate pain or  headache.    aspirin  EC 81 MG tablet Take 81 mg by mouth daily. Swallow whole.   calcium  carbonate (TUMS - DOSED IN MG ELEMENTAL CALCIUM ) 500 MG chewable tablet Chew 1 tablet by mouth 2 (two) times daily as needed for indigestion or heartburn.   Cholecalciferol  (VITAMIN D3) 50 MCG (2000 UT) TABS Take 1 tablet by mouth daily at 6 (six) AM.   clopidogrel  (PLAVIX ) 75 MG tablet Take 1 tablet (75 mg total) by mouth daily.   cyanocobalamin  2000 MCG tablet Take 2,000 mcg by mouth daily.   L-THEANINE PO Take 200 mg by mouth daily as needed (stress).   Magnesium  250 MG TABS Take 250 mg by mouth daily as needed.  montelukast  (SINGULAIR ) 10 MG tablet Take 1 tablet (10 mg total) by mouth at bedtime.   Multiple Vitamin (MULTIVITAMIN WITH MINERALS) TABS tablet Take 1 tablet by mouth daily.   naproxen (NAPROSYN) 250 MG tablet Take 250 mg by mouth as needed.   nitroGLYCERIN  (NITROSTAT ) 0.4 MG SL tablet Place 1 tablet (0.4 mg total) under the tongue every 5 (five) minutes as needed for chest pain.   Omega-3 Fatty Acids (FISH OIL) 1000 MG CAPS Take 1,000 mg by mouth daily.   pantoprazole  (PROTONIX ) 40 MG tablet Take 40 mg by mouth daily.   rosuvastatin  (CRESTOR ) 40 MG tablet Take 1 tablet (40 mg total) by mouth every morning.   tadalafil (CIALIS) 20 MG tablet Take 20 mg by mouth daily as needed.   [DISCONTINUED] lisinopril  (ZESTRIL ) 5 MG tablet Take 1 tablet (5 mg total) by mouth 2 (two) times daily.   lisinopril  (ZESTRIL ) 5 MG tablet Take 1 tablet (5 mg total) by mouth 2 (two) times daily.   [DISCONTINUED] amoxicillin -clavulanate (AUGMENTIN ) 875-125 MG tablet Take 1 tablet by mouth 2 (two) times daily. (Patient not taking: Reported on 01/06/2024)   No facility-administered encounter medications on file as of 01/11/2024.   "

## 2024-01-11 ENCOUNTER — Ambulatory Visit (INDEPENDENT_AMBULATORY_CARE_PROVIDER_SITE_OTHER): Admitting: Family Medicine

## 2024-01-11 ENCOUNTER — Encounter: Payer: Self-pay | Admitting: Family Medicine

## 2024-01-11 ENCOUNTER — Other Ambulatory Visit (INDEPENDENT_AMBULATORY_CARE_PROVIDER_SITE_OTHER)

## 2024-01-11 VITALS — BP 170/88 | HR 78 | Temp 97.8°F | Ht 70.5 in | Wt 188.5 lb

## 2024-01-11 DIAGNOSIS — I214 Non-ST elevation (NSTEMI) myocardial infarction: Secondary | ICD-10-CM

## 2024-01-11 DIAGNOSIS — I10 Essential (primary) hypertension: Secondary | ICD-10-CM

## 2024-01-11 DIAGNOSIS — I6522 Occlusion and stenosis of left carotid artery: Secondary | ICD-10-CM

## 2024-01-11 DIAGNOSIS — R42 Dizziness and giddiness: Secondary | ICD-10-CM

## 2024-01-11 DIAGNOSIS — I252 Old myocardial infarction: Secondary | ICD-10-CM | POA: Diagnosis not present

## 2024-01-11 DIAGNOSIS — R55 Syncope and collapse: Secondary | ICD-10-CM

## 2024-01-11 MED ORDER — LISINOPRIL 5 MG PO TABS: 5.0000 mg | ORAL_TABLET | Freq: Two times a day (BID) | ORAL | Status: DC

## 2024-01-12 ENCOUNTER — Encounter: Payer: Self-pay | Admitting: Family Medicine

## 2024-01-12 ENCOUNTER — Encounter: Payer: Self-pay | Admitting: Cardiology

## 2024-01-12 ENCOUNTER — Ambulatory Visit: Attending: Cardiology | Admitting: Cardiology

## 2024-01-12 ENCOUNTER — Ambulatory Visit

## 2024-01-12 VITALS — BP 148/90 | HR 88 | Ht 72.0 in | Wt 189.0 lb

## 2024-01-12 DIAGNOSIS — R42 Dizziness and giddiness: Secondary | ICD-10-CM

## 2024-01-12 DIAGNOSIS — E782 Mixed hyperlipidemia: Secondary | ICD-10-CM

## 2024-01-12 DIAGNOSIS — I252 Old myocardial infarction: Secondary | ICD-10-CM | POA: Insufficient documentation

## 2024-01-12 DIAGNOSIS — I2510 Atherosclerotic heart disease of native coronary artery without angina pectoris: Secondary | ICD-10-CM | POA: Diagnosis not present

## 2024-01-12 DIAGNOSIS — I10 Essential (primary) hypertension: Secondary | ICD-10-CM

## 2024-01-12 MED ORDER — SPIRONOLACTONE 25 MG PO TABS: 12.5000 mg | ORAL_TABLET | Freq: Every day | ORAL | 3 refills | Status: AC

## 2024-01-12 NOTE — Patient Instructions (Signed)
 Medication Instructions:  - START spironolactone  12.5 daily  *If you need a refill on your cardiac medications before your next appointment, please call your pharmacy*  Lab Work: Your provider would like for you to have following labs drawn today renin-angiotensin.   If you have labs (blood work) drawn today and your tests are completely normal, you will receive your results only by: MyChart Message (if you have MyChart) OR A paper copy in the mail If you have any lab test that is abnormal or we need to change your treatment, we will call you to review the results.  Testing/Procedures: GEOFFRY HEWS- Long Term Monitor Instructions  Your physician has requested you wear a ZIO patch monitor for 14 days.  This is a single patch monitor. [employer] supplies one patch monitor per enrollment. Additional stickers are not available. Please do not apply patch if you will be having a Nuclear Stress Test, Echocardiogram, Cardiac CT, MRI, or Chest Xray during the period you would be wearing the monitor. The patch cannot be worn during these tests. You cannot remove and re-apply the ZIO XT patch monitor.  Your ZIO patch monitor will be mailed 3 day USPS to your address on file. It may take 3-5 days to receive your monitor after you have been enrolled. Once you have received your monitor, please review the enclosed instructions. Your monitor has already been registered assigning a specific monitor serial number to you.  Billing and Patient Assistance Program Information  We have supplied [employer] with any of your insurance information on file for billing purposes.  [employer] offers a sliding scale Patient Assistance Program for patients that do not have insurance, or whose insurance does not completely cover the cost of the ZIO monitor.  You must apply for the Patient Assistance Program to qualify for this discounted rate.  To apply, please call [employer] at [phone], select option 4, select option 2, ask to apply for  Patient Assistance Program. Meredeth will ask your household income, and how many people are in your household. They will quote your out-of-pocket cost based on that information. [employer] will also be able to set up a 9-month, interest-free payment plan if needed.  Applying the monitor   Shave hair from upper left chest.  Hold abrader disc by orange tab. Rub abrader in 40 strokes over the upper left chest as indicated in your monitor instructions.  Clean area with 4 enclosed alcohol pads. Let dry.  Apply patch as indicated in monitor instructions. Patch will be placed under collarbone on left side of chest with arrow pointing upward.  Rub patch adhesive wings for 2 minutes. Remove white label marked 1. Remove the white label marked 2. Rub patch adhesive wings for 2 additional minutes.  While looking in a mirror, press and release button in center of patch. A small green light will flash 3-4 times. This will be your only indicator that the monitor has been turned on.  Do not shower for the first 24 hours. You may shower after the first 24 hours.  Press the button if you feel a symptom. You will hear a small click. Record Date, Time and Symptom in the Patient Logbook.  When you are ready to remove the patch, follow instructions on the last 2 pages of Patient Logbook.  Stick patch monitor into the tabs at the bottom of the return box.  Place Patient Logbook in the blue and white box. Use locking tab on box and tape box closed securely. The blue  and white box has prepaid postage on it. Please place it in the mailbox as soon as possible. Your physician should have your test results approximately 7-14 days after the monitor has been mailed back to [facility]  Call [facility] Customer Care at [phone] if you have questions regarding your ZIO XT patch monitor.  Call them immediately if you see an orange light blinking on your monitor.  If your monitor falls off in less than 4 days, contact  our Monitor department at [phone].  If your monitor becomes loose or falls off after 4 days call [employer] at [phone] for suggestions on securing your monitor.   Follow-Up: At [facility], you and your health needs are our priority.  As part of our continuing mission to provide you with exceptional heart care, our providers are all part of one team.  This team includes your primary Cardiologist (physician) and Advanced Practice Providers or APPs (Physician Assistants and Nurse Practitioners) who all work together to provide you with the care you need, when you need it.  Your next appointment:   2 month(s)  Provider:   You may see Redell Cave, MD   We recommend signing up for the patient portal called MyChart.  Sign up information is provided on this After Visit Summary.  MyChart is used to connect with patients for Virtual Visits (Telemedicine).  Patients are able to view lab/test results, encounter notes, upcoming appointments, etc.  Non-urgent messages can be sent to your provider as well.   To learn more about what you can do with MyChart, go to [email or web address].

## 2024-01-12 NOTE — Progress Notes (Signed)
 Cardiology Office Note:    Date:  01/12/2024   ID:  Juan Salas, DOB December 25, 1960, MRN [id number]  PCP:  Watt Mirza, MD   [facility] Providers Cardiologist:  Redell Cave, MD     Referring MD: Watt Mirza, MD   Chief Complaint  Patient presents with   Follow-up    3 month follow up visit. Patient states that he had a few ER visit related to his blood pressure. Patient states that he blood pressure spikes. He had 4 spikes in the last two weeks.  On Saturday, his blood pressure was 217/97. Patient states that he experiences pain and the site of his carotid stent. His heart rate and blood pressure goes up at the same time.  Meds reviewed.      History of Present Illness:    Juan Salas is a 63 y.o. male with a hx of nonobstructive CAD (LHC 6/25- 30% left main, 40% diagonal ), hyperlipidemia, hypertension, left carotid artery stenosis s/p left ICA stent GERD, fibromyalgia who presents due spikes in blood pressure.  States having occasional spikes in his BP, presented to the ED 5 days ago after feeling dizzy, BP noted to be in the 190s systolic.  Endorsed feeling flushed when BP becomes elevated, has been to the ER several times, workup was unrevealing.  Compliant with lisinopril  5 mg twice daily as prescribed.  Echo 5/25 EF 60-65%  Past Medical History:  Diagnosis Date   Allergic rhinitis    Fibromyalgia    GAD (generalized anxiety disorder)    GERD without esophagitis 05/26/2023   History of diverticulitis 05/26/2023   History of kidney stones    Hyperlipidemia    Hypertension    Left carotid artery stenosis 05/26/2023   Migraine headache    Nocturia associated with benign prostatic hyperplasia 12/2017   Spinal stenosis     Past Surgical History:  Procedure Laterality Date   APPENDECTOMY  2000   CAROTID PTA/STENT INTERVENTION N/A 09/08/2023   Procedure: CAROTID PTA/STENT INTERVENTION;  Surgeon: Marea Selinda RAMAN, MD;  Location: [facility]  INVASIVE CV LAB;  Service: Cardiovascular;  Laterality: N/A;   COLONOSCOPY WITH PROPOFOL  N/A 09/06/2019   Procedure: COLONOSCOPY WITH PROPOFOL ;  Surgeon: Therisa Bi, MD;  Location: [facility] ENDOSCOPY;  Service: Gastroenterology;  Laterality: N/A;   CYSTOSCOPY WITH INSERTION OF UROLIFT N/A 01/17/2018   Procedure: CYSTOSCOPY WITH INSERTION OF UROLIFT;  Surgeon: Twylla Glendia BROCKS, MD;  Location: [facility] ORS;  Service: Urology;  Laterality: N/A;   CYSTOSCOPY WITH INSERTION OF UROLIFT  01/2018   CYSTOSCOPY WITH INSERTION OF UROLIFT Bilateral    HERNIA REPAIR Left 2000   double inguinal hernia repairs   HYDROCELE EXCISION Left 10/06/2021   Procedure: HYDROCELECTOMY ADULT GROIN/UNILATERAL SCROTAL APPROACH;  Surgeon: Twylla Glendia BROCKS, MD;  Location: [facility] ORS;  Service: Urology;  Laterality: Left;   LEFT HEART CATH AND CORONARY ANGIOGRAPHY N/A 09/19/2023   Procedure: LEFT HEART CATH AND CORONARY ANGIOGRAPHY;  Surgeon: Darron Deatrice LABOR, MD;  Location: [facility] INVASIVE CV LAB;  Service: Cardiovascular;  Laterality: N/A;   LUMBAR LAMINECTOMY     laminectomy x 2. 1990, 1994   NASAL SEPTUM SURGERY  1978   repair of sinuses also   TESTICULAR TORSION REPAIR  2005   TONSILLECTOMY  1970    Current Medications: Current Meds  Medication Sig   acetaminophen  (TYLENOL ) 500 MG tablet Take 500 mg by mouth every 4 (four) hours as needed for moderate pain or headache.    aspirin  EC  81 MG tablet Take 81 mg by mouth daily. Swallow whole.   calcium  carbonate (TUMS - DOSED IN MG ELEMENTAL CALCIUM ) 500 MG chewable tablet Chew 1 tablet by mouth 2 (two) times daily as needed for indigestion or heartburn.   Cholecalciferol  (VITAMIN D3) 50 MCG (2000 UT) TABS Take 1 tablet by mouth daily at 6 (six) AM.   clopidogrel  (PLAVIX ) 75 MG tablet Take 1 tablet (75 mg total) by mouth daily.   cyanocobalamin  2000 MCG tablet Take 2,000 mcg by mouth daily.   L-THEANINE PO Take 200 mg by mouth daily as needed (stress).   lisinopril  (ZESTRIL ) 5  MG tablet Take 1 tablet (5 mg total) by mouth 2 (two) times daily.   Magnesium  250 MG TABS Take 250 mg by mouth daily as needed.   montelukast  (SINGULAIR ) 10 MG tablet Take 1 tablet (10 mg total) by mouth at bedtime.   Multiple Vitamin (MULTIVITAMIN WITH MINERALS) TABS tablet Take 1 tablet by mouth daily.   naproxen (NAPROSYN) 250 MG tablet Take 250 mg by mouth as needed.   nitroGLYCERIN  (NITROSTAT ) 0.4 MG SL tablet Place 1 tablet (0.4 mg total) under the tongue every 5 (five) minutes as needed for chest pain.   Omega-3 Fatty Acids (FISH OIL) 1000 MG CAPS Take 1,000 mg by mouth daily.   pantoprazole  (PROTONIX ) 40 MG tablet Take 40 mg by mouth daily.   rosuvastatin  (CRESTOR ) 40 MG tablet Take 1 tablet (40 mg total) by mouth every morning.   spironolactone  (ALDACTONE ) 25 MG tablet Take 0.5 tablets (12.5 mg total) by mouth daily.   tadalafil (CIALIS) 20 MG tablet Take 20 mg by mouth daily as needed.     Allergies:   Atorvastatin , Prednisone, Verapamil , Metoprolol , Seasonal ic [cholestatin], and Sulfa  antibiotics   Social History   Socioeconomic History   Marital status: Married    Spouse name: Debbie   Number of children: 2   Years of education: Not on file   Highest education level: Master's degree (e.g., MA, MS, MEng, MEd, MSW, MBA)  Occupational History   Occupation: Engineer, mining: [employer]  Tobacco Use   Smoking status: Never   Smokeless tobacco: Never  Vaping Use   Vaping status: Never Used  Substance and Sexual Activity   Alcohol use: Not Currently   Drug use: Never   Sexual activity: Not Currently  Other Topics Concern   Not on file  Social History Narrative   Non-smoker   No ETOH   No drugs   Married (Debbie)   Exercises at least 3x/week   Theatre manager (Retired)      [employer]   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   Social Drivers of Corporate investment banker Strain: Low Risk  (12/08/2023)   Overall Financial  Resource Strain (CARDIA)    Difficulty of Paying Living Expenses: Not very hard  Food Insecurity: No Food Insecurity (12/08/2023)   Hunger Vital Sign    Worried About Running Out of Food in the Last Year: Never true    Ran Out of Food in the Last Year: Never true  Transportation Needs: No Transportation Needs (12/08/2023)   PRAPARE - Administrator, Civil Service (Medical): No    Lack of Transportation (Non-Medical): No  Physical Activity: Sufficiently Active (12/08/2023)   Exercise Vital Sign    Days of Exercise per Week: 4 days    Minutes of Exercise per Session: 40 min  Stress: No Stress Concern Present (12/08/2023)   [employer] of Occupational Health - Occupational Stress Questionnaire    Feeling of Stress: Only a little  Social Connections: Moderately Isolated (12/08/2023)   Social Connection and Isolation Panel    Frequency of Communication with Friends and Family: Twice a week    Frequency of Social Gatherings with Friends and Family: Once a week    Attends Religious Services: Never    Diplomatic Services operational officer: No    Attends Engineer, structural: Not on file    Marital Status: Married     Family History: The patient's family history includes Arthritis in his maternal grandmother, mother, and paternal grandfather; Hearing loss in his maternal grandfather, paternal grandfather, and paternal grandmother; Hyperlipidemia in his father, maternal grandfather, maternal grandmother, mother, and paternal grandfather; Hypertension in his father, maternal grandfather, maternal grandmother, mother, and paternal grandfather; Kidney disease in his maternal grandfather; Miscarriages / [place] in his mother; Prostatitis in his father; Stroke in his maternal grandfather, maternal grandmother, mother, and paternal grandfather; Varicose Veins in his mother.  ROS:   Please see the history of present illness.     All other systems reviewed and are  negative.  EKGs/Labs/Other Studies Reviewed:    The following studies were reviewed today:       Recent Labs: 08/26/2023: TSH 1.881 12/12/2023: ALT 30 01/07/2024: BUN 11; Creatinine, Ser 1.17; Hemoglobin 14.9; Platelets 242; Potassium 4.3; Sodium 137  Recent Lipid Panel    Component Value Date/Time   CHOL 168 12/12/2023 0848   CHOL 192 07/01/2020 0923   TRIG 124.0 12/12/2023 0848   HDL 60.20 12/12/2023 0848   HDL 74 07/01/2020 0923   CHOLHDL 3 12/12/2023 0848   VLDL 24.8 12/12/2023 0848   LDLCALC 83 12/12/2023 0848   LDLCALC 105 (H) 07/01/2020 0923     Risk Assessment/Calculations:            Physical Exam:    VS:  BP (!) 148/90   Pulse 88   Ht 6' (1.829 m)   Wt 189 lb (85.7 kg)   SpO2 98%   BMI 25.63 kg/m     Wt Readings from Last 3 Encounters:  01/12/24 189 lb (85.7 kg)  01/11/24 188 lb 8 oz (85.5 kg)  01/06/24 189 lb (85.7 kg)     GEN:  Well nourished, well developed in no acute distress HEENT: Normal NECK: No JVD; No carotid bruits CARDIAC: RRR, no murmurs, rubs, gallops RESPIRATORY:  Clear to auscultation without rales, wheezing or rhonchi  ABDOMEN: Soft, non-tender, non-distended MUSCULOSKELETAL:  No edema; No deformity  SKIN: Warm and dry NEUROLOGIC:  Alert and oriented x 3 PSYCHIATRIC:  Normal affect   ASSESSMENT:    1. Dizziness   2. Primary hypertension   3. Coronary artery disease involving native coronary artery of native heart without angina pectoris   4. Mixed hyperlipidemia     PLAN:    In order of problems listed above:  Dizziness associated with spikes in BP, placed cardiac monitor to evaluate for any significant arrhythmias.  Recent echo showed normal EF, no significant abnormalities. Episodic spikes in BP, renal arterial ultrasound showed no significant stenosis on preliminary/results.  Obtain aldosterone renin activity ratio to evaluate secondary causes.  Start Aldactone  12.5 mg daily, continue lisinopril  5 mg twice  daily. Nonobstructive CAD 30% left main, 40% diagonal.  Echo 5/25 EF 60 to 65%.  Continue aspirin  81 mg daily, Crestor  40 mg daily.  Also takes  Plavix  due to carotid disease. Hyperlipidemia, Crestor  previously increased.  Continue Crestor  40 mg daily.  Follow-up in 6 to 8 weeks     Medication Adjustments/Labs and Tests Ordered: Current medicines are reviewed at length with the patient today.  Concerns regarding medicines are outlined above.  Orders Placed This Encounter  Procedures   Aldosterone + renin activity w/ ratio   LONG TERM MONITOR (3-14 DAYS)   Meds ordered this encounter  Medications   spironolactone  (ALDACTONE ) 25 MG tablet    Sig: Take 0.5 tablets (12.5 mg total) by mouth daily.    Dispense:  90 tablet    Refill:  3    Patient Instructions  Medication Instructions:  - START spironolactone  12.5 daily  *If you need a refill on your cardiac medications before your next appointment, please call your pharmacy*  Lab Work: Your provider would like for you to have following labs drawn today renin-angiotensin.   If you have labs (blood work) drawn today and your tests are completely normal, you will receive your results only by: MyChart Message (if you have MyChart) OR A paper copy in the mail If you have any lab test that is abnormal or we need to change your treatment, we will call you to review the results.  Testing/Procedures: GEOFFRY HEWS- Long Term Monitor Instructions  Your physician has requested you wear a ZIO patch monitor for 14 days.  This is a single patch monitor. [employer] supplies one patch monitor per enrollment. Additional stickers are not available. Please do not apply patch if you will be having a Nuclear Stress Test, Echocardiogram, Cardiac CT, MRI, or Chest Xray during the period you would be wearing the monitor. The patch cannot be worn during these tests. You cannot remove and re-apply the ZIO XT patch monitor.  Your ZIO patch monitor will be mailed 3 day  USPS to your address on file. It may take 3-5 days to receive your monitor after you have been enrolled. Once you have received your monitor, please review the enclosed instructions. Your monitor has already been registered assigning a specific monitor serial number to you.  Billing and Patient Assistance Program Information  We have supplied [employer] with any of your insurance information on file for billing purposes.  [employer] offers a sliding scale Patient Assistance Program for patients that do not have insurance, or whose insurance does not completely cover the cost of the ZIO monitor.  You must apply for the Patient Assistance Program to qualify for this discounted rate.  To apply, please call [employer] at [phone], select option 4, select option 2, ask to apply for Patient Assistance Program. Meredeth will ask your household income, and how many people are in your household. They will quote your out-of-pocket cost based on that information. [employer] will also be able to set up a 52-month, interest-free payment plan if needed.  Applying the monitor   Shave hair from upper left chest.  Hold abrader disc by orange tab. Rub abrader in 40 strokes over the upper left chest as indicated in your monitor instructions.  Clean area with 4 enclosed alcohol pads. Let dry.  Apply patch as indicated in monitor instructions. Patch will be placed under collarbone on left side of chest with arrow pointing upward.  Rub patch adhesive wings for 2 minutes. Remove white label marked 1. Remove the white label marked 2. Rub patch adhesive wings for 2 additional minutes.  While looking in a mirror, press and release button in center of patch.  A small green light will flash 3-4 times. This will be your only indicator that the monitor has been turned on.  Do not shower for the first 24 hours. You may shower after the first 24 hours.  Press the button if you feel a symptom. You will hear a small click. Record Date,  Time and Symptom in the Patient Logbook.  When you are ready to remove the patch, follow instructions on the last 2 pages of Patient Logbook.  Stick patch monitor into the tabs at the bottom of the return box.  Place Patient Logbook in the blue and white box. Use locking tab on box and tape box closed securely. The blue and white box has prepaid postage on it. Please place it in the mailbox as soon as possible. Your physician should have your test results approximately 7-14 days after the monitor has been mailed back to [facility].  Call [facility] Customer Care at [phone] if you have questions regarding your ZIO XT patch monitor.  Call them immediately if you see an orange light blinking on your monitor.  If your monitor falls off in less than 4 days, contact our Monitor department at [phone].  If your monitor becomes loose or falls off after 4 days call [employer] at [phone] for suggestions on securing your monitor.   Follow-Up: At [facility], you and your health needs are our priority.  As part of our continuing mission to provide you with exceptional heart care, our providers are all part of one team.  This team includes your primary Cardiologist (physician) and Advanced Practice Providers or APPs (Physician Assistants and Nurse Practitioners) who all work together to provide you with the care you need, when you need it.  Your next appointment:   2 month(s)  Provider:   You may see Redell Cave, MD   We recommend signing up for the patient portal called MyChart.  Sign up information is provided on this After Visit Summary.  MyChart is used to connect with patients for Virtual Visits (Telemedicine).  Patients are able to view lab/test results, encounter notes, upcoming appointments, etc.  Non-urgent messages can be sent to your provider as well.   To learn more about what you can do with MyChart, go to [email or web address].               Signed, Redell Cave, MD  01/12/2024 12:59 PM    [facility]

## 2024-01-17 ENCOUNTER — Encounter (INDEPENDENT_AMBULATORY_CARE_PROVIDER_SITE_OTHER): Payer: Self-pay | Admitting: Vascular Surgery

## 2024-01-17 ENCOUNTER — Ambulatory Visit (INDEPENDENT_AMBULATORY_CARE_PROVIDER_SITE_OTHER): Admitting: Vascular Surgery

## 2024-01-17 VITALS — BP 142/90 | HR 80 | Resp 16 | Ht 72.0 in | Wt 187.6 lb

## 2024-01-17 DIAGNOSIS — I10 Essential (primary) hypertension: Secondary | ICD-10-CM

## 2024-01-17 DIAGNOSIS — I6522 Occlusion and stenosis of left carotid artery: Secondary | ICD-10-CM

## 2024-01-17 DIAGNOSIS — E785 Hyperlipidemia, unspecified: Secondary | ICD-10-CM

## 2024-01-17 NOTE — Progress Notes (Signed)
 MRN : [id number]  Juan Salas is a 63 y.o. ([birth year]) male who presents with chief complaint of  Chief Complaint  Patient presents with   Follow-up    US  f/u from 9/24/ renal artery duplex Several bp spikes since he was here last Has been to the ER 2x for this recently  .  History of Present Illness: Patient returns today in follow up of carotid disease and his neck pain and blood pressure spikes.  He has been having severe blood pressure issues and has been seeing cardiology who is managing it.  He is on a new blood pressure medication and this does seem to help some.  His most recent pain and blood pressure spike was Friday where he had a blood pressure of almost 200 systolic but then he felt a loud pop in his left neck and since that time he has had no pain and has had no further spikes in his blood pressure.  He has had several studies to evaluate both his hypertension and his carotid stent.  A renal artery duplex showed no evidence of renal artery stenosis.  A carotid duplex showed his stent to be widely patent.  He had a CT angiogram which was interpreted as some degree of narrowing within the stent but the stent appears widely patent and that is an expected degree of narrowing with a metal artifact in the neck.  Current Outpatient Medications  Medication Sig Dispense Refill   acetaminophen  (TYLENOL ) 500 MG tablet Take 500 mg by mouth every 4 (four) hours as needed for moderate pain or headache.      aspirin  EC 81 MG tablet Take 81 mg by mouth daily. Swallow whole.     calcium  carbonate (TUMS - DOSED IN MG ELEMENTAL CALCIUM ) 500 MG chewable tablet Chew 1 tablet by mouth 2 (two) times daily as needed for indigestion or heartburn.     Cholecalciferol  (VITAMIN D3) 50 MCG (2000 UT) TABS Take 1 tablet by mouth daily at 6 (six) AM.     clopidogrel  (PLAVIX ) 75 MG tablet Take 1 tablet (75 mg total) by mouth daily. 30 tablet 6   cyanocobalamin  2000 MCG tablet Take 2,000 mcg by mouth  daily.     L-THEANINE PO Take 200 mg by mouth daily as needed (stress).     lisinopril  (ZESTRIL ) 5 MG tablet Take 1 tablet (5 mg total) by mouth 2 (two) times daily.     Magnesium  250 MG TABS Take 250 mg by mouth daily as needed.     montelukast  (SINGULAIR ) 10 MG tablet Take 1 tablet (10 mg total) by mouth at bedtime. 30 tablet 3   Multiple Vitamin (MULTIVITAMIN WITH MINERALS) TABS tablet Take 1 tablet by mouth daily.     naproxen (NAPROSYN) 250 MG tablet Take 250 mg by mouth as needed.     nitroGLYCERIN  (NITROSTAT ) 0.4 MG SL tablet Place 1 tablet (0.4 mg total) under the tongue every 5 (five) minutes as needed for chest pain. 90 tablet 3   Omega-3 Fatty Acids (FISH OIL) 1000 MG CAPS Take 1,000 mg by mouth daily.     pantoprazole  (PROTONIX ) 40 MG tablet Take 40 mg by mouth daily.     rosuvastatin  (CRESTOR ) 40 MG tablet Take 1 tablet (40 mg total) by mouth every morning. 90 tablet 3   spironolactone  (ALDACTONE ) 25 MG tablet Take 0.5 tablets (12.5 mg total) by mouth daily. 90 tablet 3   tadalafil (CIALIS) 20 MG tablet Take 20 mg by  mouth daily as needed.     No current facility-administered medications for this visit.    Past Medical History:  Diagnosis Date   Allergic rhinitis    Fibromyalgia    GAD (generalized anxiety disorder)    GERD without esophagitis 05/26/2023   History of diverticulitis 05/26/2023   History of kidney stones    Hyperlipidemia    Hypertension    Left carotid artery stenosis 05/26/2023   Migraine headache    Nocturia associated with benign prostatic hyperplasia 12/2017   Spinal stenosis     Past Surgical History:  Procedure Laterality Date   APPENDECTOMY  2000   CAROTID PTA/STENT INTERVENTION N/A 09/08/2023   Procedure: CAROTID PTA/STENT INTERVENTION;  Surgeon: Marea Selinda RAMAN, MD;  Location: [facility] INVASIVE [facility];  Service: Cardiovascular;  Laterality: N/A;   COLONOSCOPY WITH PROPOFOL  N/A 09/06/2019   Procedure: COLONOSCOPY WITH PROPOFOL ;  Surgeon: Therisa Bi, MD;  Location: [facility] ENDOSCOPY;  Service: Gastroenterology;  Laterality: N/A;   CYSTOSCOPY WITH INSERTION OF UROLIFT N/A 01/17/2018   Procedure: CYSTOSCOPY WITH INSERTION OF UROLIFT;  Surgeon: Twylla Glendia BROCKS, MD;  Location: [facility] ORS;  Service: Urology;  Laterality: N/A;   CYSTOSCOPY WITH INSERTION OF UROLIFT  01/2018   CYSTOSCOPY WITH INSERTION OF UROLIFT Bilateral    HERNIA REPAIR Left 2000   double inguinal hernia repairs   HYDROCELE EXCISION Left 10/06/2021   Procedure: HYDROCELECTOMY ADULT GROIN/UNILATERAL SCROTAL APPROACH;  Surgeon: Twylla Glendia BROCKS, MD;  Location: [facility] ORS;  Service: Urology;  Laterality: Left;   LEFT HEART CATH AND CORONARY ANGIOGRAPHY N/A 09/19/2023   Procedure: LEFT HEART CATH AND CORONARY ANGIOGRAPHY;  Surgeon: Darron Deatrice LABOR, MD;  Location: [facility] INVASIVE CV [facility];  Service: Cardiovascular;  Laterality: N/A;   LUMBAR LAMINECTOMY     laminectomy x 2. 1990, 1994   NASAL SEPTUM SURGERY  1978   repair of sinuses also   TESTICULAR TORSION REPAIR  2005   TONSILLECTOMY  1970     Social History   Tobacco Use   Smoking status: Never   Smokeless tobacco: Never  Vaping Use   Vaping status: Never Used  Substance Use Topics   Alcohol use: Not Currently   Drug use: Never       Family History  Problem Relation Age of Onset   Hypertension Mother    Arthritis Mother    Hyperlipidemia Mother    Miscarriages / [place] Mother    Stroke Mother    Varicose Veins Mother    Prostatitis Father    Hypertension Father    Hyperlipidemia Father    Hearing loss Maternal Grandfather    Hyperlipidemia Maternal Grandfather    Hypertension Maternal Grandfather    Kidney disease Maternal Grandfather    Stroke Maternal Grandfather    Arthritis Maternal Grandmother    Hyperlipidemia Maternal Grandmother    Hypertension Maternal Grandmother    Stroke Maternal Grandmother    Arthritis Paternal Grandfather    Hearing loss Paternal Grandfather    Hyperlipidemia  Paternal Grandfather    Hypertension Paternal Grandfather    Stroke Paternal Grandfather    Hearing loss Paternal Grandmother      Allergies  Allergen Reactions   Atorvastatin  Other (See Comments)    Joint pain Joint pain    Prednisone Other (See Comments)    After 3 days agitation and sleeplessness After 3 days agitation and sleeplessness    Verapamil  Nausea Only    Other reaction(s): Headache insomnia insomnia    Metoprolol  Palpitations  Per patient, can't tolerate. Per patient, can't tolerate.    Seasonal Ic [Cholestatin] Other (See Comments)    Sinus issues   Sulfa  Antibiotics Rash     REVIEW OF SYSTEMS (Negative unless checked)  Constitutional: [] Weight loss  [] Fever  [] Chills Cardiac: [] Chest pain   [] Chest pressure   [] Palpitations   [] Shortness of breath when laying flat   [] Shortness of breath at rest   [] Shortness of breath with exertion. Vascular:  [] Pain in legs with walking   [] Pain in legs at rest   [] Pain in legs when laying flat   [] Claudication   [] Pain in feet when walking  [] Pain in feet at rest  [] Pain in feet when laying flat   [] History of DVT   [] Phlebitis   [] Swelling in legs   [] Varicose veins   [] Non-healing ulcers Pulmonary:   [] Uses home oxygen   [] Productive cough   [] Hemoptysis   [] Wheeze  [] COPD   [] Asthma Neurologic:  [x] Dizziness  [] Blackouts   [] Seizures   [] History of stroke   [] History of TIA  [] Aphasia   [] Temporary blindness   [] Dysphagia   [] Weakness or numbness in arms   [] Weakness or numbness in legs Musculoskeletal:  [] Arthritis   [] Joint swelling   [] Joint pain   [] Low back pain Hematologic:  [] Easy bruising  [] Easy bleeding   [] Hypercoagulable state   [] Anemic   Gastrointestinal:  [] Blood in stool   [] Vomiting blood  [x] Gastroesophageal reflux/heartburn   [] Abdominal pain Genitourinary:  [] Chronic kidney disease   [] Difficult urination  [] Frequent urination  [] Burning with urination   [] Hematuria Skin:  [] Rashes   [] Ulcers    [] Wounds Psychological:  [x] History of anxiety   []  History of major depression.  Physical Examination  BP (!) 142/90   Pulse 80   Resp 16   Ht 6' (1.829 m)   Wt 187 lb 9.6 oz (85.1 kg)   BMI 25.44 kg/m  Gen:  WD/WN, NAD. Appears younger than stated age. Head: [place]/AT, No temporalis wasting. Ear/Nose/Throat: Hearing grossly intact, nares w/o erythema or drainage Eyes: Conjunctiva clear. Sclera non-icteric Neck: Supple.  Trachea midline Pulmonary:  Good air movement, no use of accessory muscles.  Cardiac: RRR, no JVD Vascular:  Vessel Right Left  Radial Palpable Palpable           Musculoskeletal: M/S 5/5 throughout.  No deformity or atrophy. No edema. Neurologic: Sensation grossly intact in extremities.  Symmetrical.  Speech is fluent.  Psychiatric: Judgment intact, Mood & affect appropriate for pt's clinical situation. Dermatologic: No rashes or ulcers noted.  No cellulitis or open wounds.      Labs Recent Results (from the past 2160 hours)  Hepatic function panel     Status: None   Collection Time: 12/12/23  8:48 AM  Result Value Ref Range   Total Bilirubin 0.4 0.2 - 1.2 mg/dL   Bilirubin, Direct 0.1 0.0 - 0.3 mg/dL   Alkaline Phosphatase 59 39 - 117 U/L   AST 29 0 - 37 U/L   ALT 30 0 - 53 U/L   Total Protein 7.2 6.0 - 8.3 g/dL   Albumin 4.8 3.5 - 5.2 g/dL  Lipid panel     Status: None   Collection Time: 12/12/23  8:48 AM  Result Value Ref Range   Cholesterol 168 0 - 200 mg/dL    Comment: ATP III Classification       Desirable:  < 200 mg/dL  Borderline High:  200 - 239 mg/dL          High:  > = 759 mg/dL   Triglycerides 875.9 0.0 - 149.0 mg/dL    Comment: Normal:  <849 mg/dLBorderline High:  150 - 199 mg/dL   HDL 39.79 >60.99 mg/dL   VLDL 75.1 0.0 - 59.9 mg/dL   LDL Cholesterol 83 0 - 99 mg/dL   Total CHOL/HDL Ratio 3     Comment:                Men          Women1/2 Average Risk     3.4          3.3Average Risk          5.0          4.42X Average  Risk          9.6          7.13X Average Risk          15.0          11.0                       NonHDL 108.05     Comment: NOTE:  Non-HDL goal should be 30 mg/dL higher than patient's LDL goal (i.e. LDL goal of < 70 mg/dL, would have non-HDL goal of < 100 mg/dL)  PSA, Total with Reflex to PSA, Free     Status: None   Collection Time: 12/12/23  8:48 AM  Result Value Ref Range   PSA, Total 0.9 < OR = 4.0 ng/mL    Comment: The Total PSA value from this assay system is  standardized against the equimolar PSA standard.  The test result will be approximately 20% higher  when compared to the [facility] Total PSA  (Siemens assay). Comparison of serial PSA results  should be interpreted with this fact in mind. SABRA PSA was performed using the [employer] Coulter  Immunoassay method. Values obtained from different  assay methods cannot be used interchangeably. PSA  levels, regardless of value, should not be  interpreted as absolute evidence of the presence or  absence of disease.   Hemoglobin A1c     Status: None   Collection Time: 12/12/23  8:48 AM  Result Value Ref Range   Hgb A1c MFr Bld 6.1 4.6 - 6.5 %    Comment: Glycemic Control Guidelines for People with Diabetes:Non Diabetic:  <6%Goal of Therapy: <7%Additional Action Suggested:  >8%   GeneConnect Molecular Screen - Blood ([facility])     Status: None   Collection Time: 12/21/23  3:22 PM  Result Value Ref Range   Genetic Analysis Overall Interpretation Negative    Genetic Disease Assessed      This is a screening test and does not detect all pathogenic or likely pathogenic variant(s) in the tested genes; diagnostic testing is recommended for individuals with a personal or family history of heart disease or hereditary cancer. [facility] Tier One  Population Screen is a screening test that analyzes 11 genes related to hereditary breast and ovarian cancer (HBOC) syndrome, Lynch syndrome, and familial hypercholesterolemia. This test  only reports clinically significant pathogenic and likely  pathogenic variants but does not report variants of uncertain significance (VUS). In addition, analysis of the PMS2 gene excludes exons 11-15, which overlap with a known pseudogene (PMS2CL).    Genetic Analysis Report      No pathogenic or  likely pathogenic variants were detected in the genes analyzed by this test.Genetic test results should be interpreted in the context of an individual's personal medical and family history. Alteration to medical management is NOT  recommended based solely on this result. Clinical correlation is advised.Additional Considerations- This is a screening test; individuals may still carry pathogenic or likely pathogenic variant(s) in the tested genes that are not detected by this test.-  For individuals at risk for these or other related conditions based on factors including personal or family history, diagnostic testing is recommended.- The absence of pathogenic or likely pathogenic variant(s) in the analyzed genes, while reassuring,  does not eliminate the possibility of a hereditary condition; there are other variants and genes associated with heart disease and hereditary cancer that are not included in this test.    Genes Tested See Notes     Comment: APOB, BRCA1, BRCA2, EPCAM, LDLR, LDLRAP1, PCSK9, PMS2, MLH1, MSH2, MSH6   Disclaimer See Notes     Comment: This test was developed and validated by [facility] This test has not been cleared or approved by the [place]  Food and Drug Administration (FDA). The [facility] laboratory is accredited by the College of American Pathologists (CAP) and certified under  the Clinical Laboratory Improvement Amendments (CLIA #: [id number]) to perform high-complexity clinical tests. This test is used for clinical purposes. It should not be regarded as investigational use only or for research use only.    Sequencing Location See Notes     Comment: Sequencing done at [facility] [address], [address], [place] [address] (CLIA# [id number])   Interpretation Methods and Limitations See Notes     Comment: Extracted DNA is enriched for targeted regions and then sequenced using the [facility] Exome+ (R) assay on an Illumina DNA sequencing system. Data is then aligned to a modified version of GRCh38 and all genes are analyzed using the MANE transcript and MANE  Plus Clinical transcript, when available. Small variant calling is completed using a customized version of [employer]'s DNAseq software, augmented by a proprietary small variant caller for difficult variants. Copy number variants (CNVs) are then called  using a proprietary bioinformatics pipeline based on depth analysis with a comparison to similarly sequenced samples. Analysis of the PMS2 gene is limited to exons 1-10. The interpretation and reporting of variants in APOB, PCSK9, and LDLR is specific to  familial hypercholesterolemia; variants associated with hypobetalipoproteinemia are not included. Interpretation is based upon guidelines published by the [employer] and [employer]), the Association for Mol ecular Pathology  (AMP) or their modification by [employer] when available and/or review of previous clinical assertions available in the [employer]. Interpretation is limited to the transcripts indicated on the report and +/- 10 bp into  intronic regions, except as noted below. [facility] variant classifications include pathogenic, likely pathogenic, variant of uncertain significance (VUS), likely benign, and benign. Only variants classified as pathogenic and likely pathogenic are included in  the report. All reported variants are confirmed through secondary manual inspection of DNA sequence data or orthogonal testing. Risk estimations and management guidelines included in this report are based on analysis of primary literature and  recommendations of  applicable professional societies, and should be regarded as approximations.Based on validation studies,this assay delivers > 99% sensitivity and specificity for single nucleotide variants and insertions  and deletions (indels) up to 20  bp. Larger indels and complex variants are also reported but sensitivity may  be reduced. Based on validation studies, this assay delivers > 99% sensitivity to multi-exon CNVs and > 90% sensitivity to single-exon CNVs. This test may not detect variants  in challenging regions (such as short tandem repeats, homopolymer runs, and segment duplications), sub-exonic CNVs, chromosomal aneuploidy, or variants in the presence of mosaicism. Phasing will be attempted and reported, when possible. Structural  rearrangements such as inversions, translocations, and gene conversions are not tested in this assay unless explicitly indicated. Additionally, deep intronic, promoter, and enhancer regions may not be covered. It is important to note that this is a  screening test and cannot detect all disease-causing variants. A negative result does not guarantee the absence of a rare, undetectable variant in the genes analyzed; consider using a diagnostic test if there is  significant personal and/or family history  of one of the conditions analyzed by this test. Any potential incidental findings outside of these genes and conditions will not be identified, nor reported. The results of a genetic test may be influenced by various factors, including bone marrow  transplantation, blood transfusions, or in rare cases, hematolymphoid neoplasms.Gene Specific Notes:APOB: analysis is limited to c.10580G>A and c.10579C>T; BRCA1: sequencing analysis extends to CDS +/-20 bp; BRCA2: sequencing analysis extends to CDS  +/-20 bp. EPCAM: analysis is limited to CNVof exons 8-9; LDLR: analysis includes CNV ofthe promoter; MLH1: analysis includes CNV of the promoter; PMS2: analysis is limited to exons 1-10.Donnice JINNY Kemp, PhD, [email or web address]   Basic metabolic panel     Status: Abnormal   Collection Time: 12/28/23  4:09 PM  Result Value Ref Range   Sodium 137 135 - 145 mmol/L   Potassium 3.8 3.5 - 5.1 mmol/L   Chloride 101 98 - 111 mmol/L   CO2 25 22 - 32 mmol/L   Glucose, Bld 106 (H) 70 - 99 mg/dL    Comment: Glucose reference range applies only to samples taken after fasting for at least 8 hours.   BUN 14 8 - 23 mg/dL   Creatinine, Ser 8.67 (H) 0.61 - 1.24 mg/dL   Calcium  9.9 8.9 - 10.3 mg/dL   GFR, Estimated >39 >39 mL/min    Comment: (NOTE) Calculated using the CKD-EPI Creatinine Equation (2021)    Anion gap 11 5 - 15    Comment: Performed at [facility], [address] [place], [place] [address]  CBC     Status: None   Collection Time: 12/28/23  4:09 PM  Result Value Ref Range   WBC 5.5 4.0 - 10.5 K/uL   RBC 4.53 4.22 - 5.81 MIL/uL   Hemoglobin 13.8 13.0 - 17.0 g/dL   HCT 58.3 60.9 - 47.9 %   MCV 91.8 80.0 - 100.0 fL   MCH 30.5 26.0 - 34.0 pg   MCHC 33.2 30.0 - 36.0 g/dL   RDW 87.5 88.4 - 84.4 %   Platelets 263 150 - 400 K/uL   nRBC 0.0 0.0 - 0.2 %    Comment: Performed at [facility], [address], [place], [place] [address]  Troponin I (High Sensitivity)     Status: Abnormal   Collection Time: 12/28/23  4:09 PM  Result Value Ref Range   Troponin I (High Sensitivity) 33 (H) <18 ng/L    Comment: (NOTE) Elevated high sensitivity troponin I (hsTnI) values and significant  changes across serial measurements may suggest ACS but many other  chronic and acute conditions are known to elevate hsTnI results.  Refer to the Links section for chest  pain algorithms and additional  guidance. Performed at [facility], [address] [place], [place] [address]   Protime-INR (order if Patient is taking Coumadin / Warfarin)     Status: None   Collection Time: 12/28/23  4:09 PM  Result Value Ref Range   Prothrombin Time 12.3 11.4 - 15.2  seconds   INR 0.9 0.8 - 1.2    Comment: (NOTE) INR goal varies based on device and disease states. Performed at [facility], [address] [place], [place] [address]   Troponin I (High Sensitivity)     Status: Abnormal   Collection Time: 12/28/23  6:41 PM  Result Value Ref Range   Troponin I (High Sensitivity) 36 (H) <18 ng/L    Comment: (NOTE) Elevated high sensitivity troponin I (hsTnI) values and significant  changes across serial measurements may suggest ACS but many other  chronic and acute conditions are known to elevate hsTnI results.  Refer to the Links section for chest pain algorithms and additional  guidance. Performed at [facility], [address] [place], [place] [address]   Basic metabolic panel     Status: Abnormal   Collection Time: 01/07/24 12:04 PM  Result Value Ref Range   Sodium 137 135 - 145 mmol/L   Potassium 4.3 3.5 - 5.1 mmol/L   Chloride 99 98 - 111 mmol/L   CO2 26 22 - 32 mmol/L   Glucose, Bld 105 (H) 70 - 99 mg/dL    Comment: Glucose reference range applies only to samples taken after fasting for at least 8 hours.   BUN 11 8 - 23 mg/dL   Creatinine, Ser 8.82 0.61 - 1.24 mg/dL   Calcium  9.7 8.9 - 10.3 mg/dL   GFR, Estimated >39 >39 mL/min    Comment: (NOTE) Calculated using the CKD-EPI Creatinine Equation (2021)    Anion gap 12 5 - 15    Comment: Performed at [facility], [address] [place], [place] [address]  CBC     Status: None   Collection Time: 01/07/24 12:04 PM  Result Value Ref Range   WBC 5.2 4.0 - 10.5 K/uL   RBC 4.80 4.22 - 5.81 MIL/uL   Hemoglobin 14.9 13.0 - 17.0 g/dL   HCT 56.1 60.9 - 47.9 %   MCV 91.3 80.0 - 100.0 fL   MCH 31.0 26.0 - 34.0 pg   MCHC 34.0 30.0 - 36.0 g/dL   RDW 87.3 88.4 - 84.4 %   Platelets 242 150 - 400 K/uL   nRBC 0.0 0.0 - 0.2 %    Comment: Performed at [facility], [address] [place], [place] [address]  Troponin I (High Sensitivity)     Status:  Abnormal   Collection Time: 01/07/24 12:04 PM  Result Value Ref Range   Troponin I (High Sensitivity) 40 (H) <18 ng/L    Comment: (NOTE) Elevated high sensitivity troponin I (hsTnI) values and significant  changes across serial measurements may suggest ACS but many other  chronic and acute conditions are known to elevate hsTnI results.  Refer to the Links section for chest pain algorithms and additional  guidance. Performed at [facility], [address] [place], [place] [address]   Troponin I (High Sensitivity)     Status: Abnormal   Collection Time: 01/07/24  2:08 PM  Result Value Ref Range   Troponin I (High Sensitivity) 48 (H) <18 ng/L    Comment: (NOTE) Elevated high sensitivity troponin I (hsTnI) values  and significant  changes across serial measurements may suggest ACS but many other  chronic and acute conditions are known to elevate hsTnI results.  Refer to the Links section for chest pain algorithms and additional  guidance. Performed at [facility], [address] [place], [place] [address]   Aldosterone + renin activity w/ ratio     Status: None (Preliminary result)   Collection Time: 01/12/24 10:07 AM  Result Value Ref Range   Aldosterone WILL FOLLOW    Renin Activity, Plasma Comment 0.167 - 5.380 ng/mL/hr    Comment: Result being verified. Final report to follow.   Aldos/Renin Ratio WILL FOLLOW     Radiology VAS US  RENAL ARTERY DUPLEX Result Date: 01/13/2024 ABDOMINAL VISCERAL Patient Name:  Juan Salas  Date of Exam:   01/11/2024 Medical Rec #: [id number]           Accession #:    [id number] Date of Birth: 1961-03-01           Patient Gender: M Patient Age:   11 years Exam Location:  [facility] Procedure:      VAS US  RENAL ARTERY DUPLEX Referring Phys: [id number] ORVIN FORBES DARING -------------------------------------------------------------------------------- Indications: Intermittant spike in HTN s/p left carotid stent  placement Performing Technologist: Jerel Croak RVT  Examination Guidelines: A complete evaluation includes B-mode imaging, spectral Doppler, color Doppler, and power Doppler as needed of all accessible portions of each vessel. Bilateral testing is considered an integral part of a complete examination. Limited examinations for reoccurring indications may be performed as noted.  Duplex Findings: +----------+--------+--------+------+--------+ MesentericPSV cm/sEDV cm/sPlaqueComments +----------+--------+--------+------+--------+ Aorta Mid    74                          +----------+--------+--------+------+--------+    +------------------+--------+--------+-------+ Right Renal ArteryPSV cm/sEDV cm/sComment +------------------+--------+--------+-------+ Proximal            115                   +------------------+--------+--------+-------+ Mid                  60                   +------------------+--------+--------+-------+ Distal               57                   +------------------+--------+--------+-------+ +-----------------+--------+--------+-------+ Left Renal ArteryPSV cm/sEDV cm/sComment +-----------------+--------+--------+-------+ Proximal           114                   +-----------------+--------+--------+-------+ Mid                 57                   +-----------------+--------+--------+-------+ Distal              62                   +-----------------+--------+--------+-------+ +------------+--------+--------+----+-----------+--------+--------+----+ Right KidneyPSV cm/sEDV cm/sRI  Left KidneyPSV cm/sEDV cm/sRI   +------------+--------+--------+----+-----------+--------+--------+----+ Upper Pole                      Upper Pole                      +------------+--------+--------+----+-----------+--------+--------+----+ Mid  63      19      0.        48      15      0.69  +------------+--------+--------+----+-----------+--------+--------+----+ Lower Pole                      Lower Pole                      +------------+--------+--------+----+-----------+--------+--------+----+ Hilar                           Hilar                           +------------+--------+--------+----+-----------+--------+--------+----+ +------------------+----+------------------+-----+ Right Kidney          Left Kidney             +------------------+----+------------------+-----+ RAR                   RAR                     +------------------+----+------------------+-----+ RAR (manual)      1.53RAR (manual)      1.53  +------------------+----+------------------+-----+ Cortex                Cortex                  +------------------+----+------------------+-----+ Cortex thickness      Corex thickness         +------------------+----+------------------+-----+ Kidney length (cm)9.94Kidney length (cm)10.33 +------------------+----+------------------+-----+  Summary: Renal:  Right: Normal size right kidney. Normal right Resisitive Index.        Normal cortical thickness of right kidney. No evidence of        right renal artery stenosis. RRV flow present. Left:  Normal size of left kidney. Normal left Resistive Index.        Normal cortical thickness of the left kidney. No evidence of        left renal artery stenosis. LRV flow present.  *See table(s) above for measurements and observations.  Diagnosing physician: Selinda Gu MD  Electronically signed by Selinda Gu MD on 01/13/2024 at 8:24:18 AM.    Final    VAS US  CAROTID Result Date: 01/09/2024 Carotid Arterial Duplex Study Patient Name:  Juan Salas  Date of Exam:   01/06/2024 Medical Rec #: [id number]           Accession #:    [id number] Date of Birth: [birth year]           Patient Gender: M Patient Age:   47 years Exam Location:  [facility] Procedure:      VAS US  CAROTID Referring Phys:  SELINDA GU --------------------------------------------------------------------------------  Indications:   Carotid artery disease and Left stent. Risk Factors:  Hypertension, hyperlipidemia, no history of smoking, prior MI. Other Factors: Lt ICA stent 08/2023. Performing Technologist: Donnice Charnley RVT  Examination Guidelines: A complete evaluation includes B-mode imaging, spectral Doppler, color Doppler, and power Doppler as needed of all accessible portions of each vessel. Bilateral testing is considered an integral part of a complete examination. Limited examinations for reoccurring indications may be performed as noted.  Right Carotid Findings: +----------+--------+--------+--------+------------------+--------+           PSV cm/sEDV cm/sStenosisPlaque DescriptionComments +----------+--------+--------+--------+------------------+--------+ CCA Prox  138     25                                         +----------+--------+--------+--------+------------------+--------+  CCA Mid   94      19                                         +----------+--------+--------+--------+------------------+--------+ CCA Distal88      19                                         +----------+--------+--------+--------+------------------+--------+ ICA Prox  82      23      Normal                             +----------+--------+--------+--------+------------------+--------+ ICA Mid   90      33                                         +----------+--------+--------+--------+------------------+--------+ ICA Distal93      34                                         +----------+--------+--------+--------+------------------+--------+ ECA       123     14                                         +----------+--------+--------+--------+------------------+--------+ +----------+--------+-------+----------------+-------------------+           PSV cm/sEDV cmsDescribe        Arm Pressure (mmHG)  +----------+--------+-------+----------------+-------------------+ [id number]             Multiphasic, WNL                    +----------+--------+-------+----------------+-------------------+ +---------+--------+--+--------+--+---------+ VertebralPSV cm/s46EDV cm/s12Antegrade +---------+--------+--+--------+--+---------+  Left Carotid Findings: +----------+--------+--------+--------+-------------------+-------------+           PSV cm/sEDV cm/sStenosisPlaque Description Comments      +----------+--------+--------+--------+-------------------+-------------+ CCA Prox  83      23                                               +----------+--------+--------+--------+-------------------+-------------+ CCA Mid   83      28                                               +----------+--------+--------+--------+-------------------+-------------+ CCA Distal57      22                                 stent         +----------+--------+--------+--------+-------------------+-------------+ ICA Prox  82      28      1-39%   calcific and smoothstent         +----------+--------+--------+--------+-------------------+-------------+ ICA Mid   97      28                                               +----------+--------+--------+--------+-------------------+-------------+  ICA Distal87      25                                               +----------+--------+--------+--------+-------------------+-------------+ ECA       341     56      >50%                       through stent +----------+--------+--------+--------+-------------------+-------------+ +----------+--------+--------+----------------+-------------------+           PSV cm/sEDV cm/sDescribe        Arm Pressure (mmHG) +----------+--------+--------+----------------+-------------------+ [id number]              Multiphasic, WNL                     +----------+--------+--------+----------------+-------------------+ +---------+--------+--+--------+--+---------+ VertebralPSV cm/s52EDV cm/s16Antegrade +---------+--------+--+--------+--+---------+  Left Stent(s): +-----------------+--------+--------+--------+--------+--------+ Distal CCA to ICAPSV cm/sEDV cm/sStenosisWaveformComments +-----------------+--------+--------+--------+--------+--------+ Prox to Stent    62      23                               +-----------------+--------+--------+--------+--------+--------+ Proximal Stent   65      25                               +-----------------+--------+--------+--------+--------+--------+ Mid Stent        95      29                               +-----------------+--------+--------+--------+--------+--------+ Distal Stent     104     31                               +-----------------+--------+--------+--------+--------+--------+ Distal to Stent  88      29                               +-----------------+--------+--------+--------+--------+--------+    Summary: Right Carotid: There is no evidence of stenosis in the right ICA. Left Carotid: Velocities in the left ICA are consistent with a 1-39% stenosis.               The ECA appears >50% stenosed. Vertebrals:  Bilateral vertebral arteries demonstrate antegrade flow. Subclavians: Normal flow hemodynamics were seen in bilateral subclavian              arteries. *See table(s) above for measurements and observations.  Electronically signed by Selinda Gu MD on 01/09/2024 at 10:35:48 AM.    Final    DG Chest 2 View Result Date: 01/07/2024 CLINICAL DATA:  Hypertension. Intermittent tachycardia. Weakness and dizziness this morning. EXAM: CHEST - 2 VIEW COMPARISON:  12/28/2023 FINDINGS: Lungs are adequately inflated and otherwise clear. Cardiomediastinal silhouette and remainder of the exam is unchanged. IMPRESSION: No active cardiopulmonary disease. Electronically Signed    By: Toribio Agreste M.D.   On: 01/07/2024 13:06   CT Angio Head Neck W WO CM Result Date: 12/28/2023 CLINICAL DATA:  Initial evaluation for acute near syncope. EXAM: CT ANGIOGRAPHY HEAD AND NECK WITH AND WITHOUT CONTRAST TECHNIQUE: Multidetector CT imaging of the  head and neck was performed using the standard protocol during bolus administration of intravenous contrast. Multiplanar CT image reconstructions and MIPs were obtained to evaluate the vascular anatomy. Carotid stenosis measurements (when applicable) are obtained utilizing NASCET criteria, using the distal internal carotid diameter as the denominator. RADIATION DOSE REDUCTION: This exam was performed according to the departmental dose-optimization program which includes automated exposure control, adjustment of the mA and/or kV according to patient size and/or use of iterative reconstruction technique. CONTRAST:  75mL OMNIPAQUE  IOHEXOL  350 MG/ML SOLN COMPARISON:  Prior exam from 09/17/2023 FINDINGS: CT HEAD FINDINGS Brain: Cerebral volume within normal limits. Mild chronic microvascular ischemic disease for age. No acute intracranial hemorrhage. No acute large vessel territory infarct. No mass lesion or midline shift. No hydrocephalus or extra-axial fluid collection. Vascular: No abnormal hyperdense vessel. Skull: Scalp soft tissues and calvarium within normal limits. Sinuses/Orbits: Globes orbital soft tissues within normal limits. Visualized paranasal sinuses and mastoid air cells are largely clear. Other: None. Review of the MIP images confirms the above findings CTA NECK FINDINGS Aortic arch: Visualized aortic arch within normal limits for caliber with standard 3 vessel morphology. Aortic atherosclerosis. No significant stenosis about the origin the great vessels. Right carotid system: Right common and internal carotid arteries are patent without dissection. Mild atheromatous change about the right carotid bulb without hemodynamically significant  greater than 50% stenosis. Left carotid system: Left common and internal carotid arteries are patent without dissection. Vascular stent traverses the left carotid bulb. Patent flow through the stent. Focal narrowing at the mid aspect distant with up to approximately 50% stenosis by NASCET criteria, stable. Vertebral arteries: Both vertebral arteries arise from subclavian arteries. 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 Vertebral arteries otherwise patent without stenosis or dissection. Skeleton: No discrete or worrisome osseous lesions. Other neck: No other acute finding. Upper chest: No other acute finding. Review of the MIP images confirms the above findings CTA HEAD FINDINGS Anterior circulation: Mild atheromatous change about the carotid siphons without hemodynamically significant stenosis. A1 segments patent bilaterally. Normal anterior communicating complex. Anterior cerebral arteries patent without stenosis. No M1 stenosis or occlusion. Distal MCA branches perfused and symmetric. Posterior circulation: Both V4 segments patent without stenosis. Right vertebral artery dominant. Both PICA patent. Basilar patent without stenosis. Superior cerebellar and posterior cerebral arteries patent bilaterally. Venous sinuses: Patent allowing for timing the contrast bolus. Anatomic variants: As above.  No aneurysm. Review of the MIP images confirms the above findings IMPRESSION: CT HEAD: 1. No acute intracranial abnormality. 2. Mild chronic microvascular ischemic disease for age. CTA HEAD AND NECK: 1. Negative CTA for large vessel occlusion or other emergent finding. 2. Vascular stent traverses the left carotid bulb with patent flow through the stent. Focal narrowing at the mid aspect of the stent with up to approximately 50% stenosis by NASCET criteria, stable. 3. 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 Aortic Atherosclerosis (ICD10-I70.0).  Electronically Signed   By: Morene Hoard M.D.   On: 12/28/2023 20:14   DG Chest 2 View Result Date: 12/28/2023 CLINICAL DATA:  Syncope. EXAM: CHEST - 2 VIEW COMPARISON:  Aug 26, 2023 FINDINGS: The heart size and mediastinal contours are within normal limits. Both lungs are clear. The visualized skeletal structures are unremarkable. IMPRESSION: No active cardiopulmonary disease. Electronically Signed   By: Suzen Dials M.D.   On: 12/28/2023 16:26    Assessment/Plan  Left carotid artery stenosis He has had several  studies to evaluate both his hypertension and his carotid stent.  A renal artery duplex showed no evidence of renal artery stenosis.  A carotid duplex showed his stent to be widely patent.  He had a CT angiogram which was interpreted as some degree of narrowing within the stent but the stent appears widely patent and that is an expected degree of narrowing with a metal artifact in the neck.  He has a scheduled carotid duplex follow-up after the first of the year.  His carotid stent looks great with good flow.  No further recommendations from vascular surgery standpoint at this point.  Essential hypertension He has had several studies to evaluate both his hypertension and his carotid stent.  A renal artery duplex showed no evidence of renal artery stenosis.  A carotid duplex showed his stent to be widely patent.  He had a CT angiogram which was interpreted as some degree of narrowing within the stent but the stent appears widely patent and that is an expected degree of narrowing with a metal artifact in the neck.  His blood pressure seems to be coming under better control and his cardiologist is doing a good job with the medical regimen.  I do not have a good vascular surgery cause of his spikes in blood pressure.  Hyperlipidemia lipid control important in reducing the progression of atherosclerotic disease. Continue statin therapy    Selinda Gu, MD  01/17/2024 1:08  PM    This note was created with Dragon medical transcription system.  Any errors from dictation are purely unintentional

## 2024-01-17 NOTE — Assessment & Plan Note (Signed)
 He has had several studies to evaluate both his hypertension and his carotid stent.  A renal artery duplex showed no evidence of renal artery stenosis.  A carotid duplex showed his stent to be widely patent.  He had a CT angiogram which was interpreted as some degree of narrowing within the stent but the stent appears widely patent and that is an expected degree of narrowing with a metal artifact in the neck.  His blood pressure seems to be coming under better control and his cardiologist is doing a good job with the medical regimen.  I do not have a good vascular surgery cause of his spikes in blood pressure.

## 2024-01-17 NOTE — Assessment & Plan Note (Signed)
 He has had several studies to evaluate both his hypertension and his carotid stent.  A renal artery duplex showed no evidence of renal artery stenosis.  A carotid duplex showed his stent to be widely patent.  He had a CT angiogram which was interpreted as some degree of narrowing within the stent but the stent appears widely patent and that is an expected degree of narrowing with a metal artifact in the neck.  He has a scheduled carotid duplex follow-up after the first of the year.  His carotid stent looks great with good flow.  No further recommendations from vascular surgery standpoint at this point.

## 2024-01-17 NOTE — Assessment & Plan Note (Signed)
 lipid control important in reducing the progression of atherosclerotic disease. Continue statin therapy

## 2024-01-18 ENCOUNTER — Ambulatory Visit: Payer: Self-pay | Admitting: Cardiology

## 2024-01-18 LAB — ALDOSTERONE + RENIN ACTIVITY W/ RATIO
Aldos/Renin Ratio: 0.2 (ref 0.0–30.0)
Aldosterone: 1.6 ng/dL (ref 0.0–30.0)
Renin Activity, Plasma: 8.225 ng/mL/h — AB (ref 0.167–5.380)

## 2024-02-09 DIAGNOSIS — I10 Essential (primary) hypertension: Secondary | ICD-10-CM | POA: Diagnosis not present

## 2024-02-09 DIAGNOSIS — R42 Dizziness and giddiness: Secondary | ICD-10-CM

## 2024-02-24 ENCOUNTER — Encounter: Payer: Self-pay | Admitting: Family Medicine

## 2024-02-25 MED ORDER — PANTOPRAZOLE SODIUM 40 MG PO TBEC: 40.0000 mg | DELAYED_RELEASE_TABLET | Freq: Every day | ORAL | 3 refills | Status: AC

## 2024-02-25 MED ORDER — MONTELUKAST SODIUM 10 MG PO TABS: 10.0000 mg | ORAL_TABLET | Freq: Every day | ORAL | 1 refills | Status: AC

## 2024-03-19 ENCOUNTER — Encounter: Payer: Self-pay | Admitting: Cardiology

## 2024-03-19 ENCOUNTER — Ambulatory Visit: Attending: Cardiology | Admitting: Cardiology

## 2024-03-19 VITALS — BP 115/78 | HR 82 | Ht 72.0 in | Wt 190.2 lb

## 2024-03-19 DIAGNOSIS — I2510 Atherosclerotic heart disease of native coronary artery without angina pectoris: Secondary | ICD-10-CM

## 2024-03-19 DIAGNOSIS — I10 Essential (primary) hypertension: Secondary | ICD-10-CM

## 2024-03-19 DIAGNOSIS — E782 Mixed hyperlipidemia: Secondary | ICD-10-CM

## 2024-03-19 DIAGNOSIS — R42 Dizziness and giddiness: Secondary | ICD-10-CM

## 2024-03-19 MED ORDER — LISINOPRIL 5 MG PO TABS: 5.0000 mg | ORAL_TABLET | Freq: Every day | ORAL | Status: AC

## 2024-03-19 NOTE — Progress Notes (Signed)
 Cardiology Office Note:    Date:  03/19/2024   ID:  Juan Salas, DOB 1961/03/21, MRN [id number]  PCP:  Watt Mirza, MD   [facility] Providers Cardiologist:  Redell Cave, MD     Referring MD: Watt Mirza, MD   Chief Complaint  Patient presents with   Follow-up    Pt doing good.     History of Present Illness:    Juan Salas is a 63 y.o. male with a hx of nonobstructive CAD (LHC 6/25- 30% left main, 40% diagonal ), hyperlipidemia, hypertension, left carotid artery stenosis s/p left ICA stent GERD, fibromyalgia who presents for follow-up.  Previously seen due to BP spikes, Aldactone  12.5 mg daily was started with good effect.  States BP has been adequately controlled.  Blood pressure became low with systolics in the 90s causing him to reduce Aldactone  dose to 6.25 mg daily.  Prior notes/testing Cardiac monitor 10/25 6 episodes of nonsustained SVT, no A-fib or flutter, no sustained arrhythmias Echo 5/25 EF 60-65% Renal arterial ultrasound 9/25 no evidence for renal arterial stenosis.   Past Medical History:  Diagnosis Date   Allergic rhinitis    Fibromyalgia    GAD (generalized anxiety disorder)    GERD without esophagitis 05/26/2023   History of diverticulitis 05/26/2023   History of kidney stones    Hyperlipidemia    Hypertension    Left carotid artery stenosis 05/26/2023   Migraine headache    Nocturia associated with benign prostatic hyperplasia 12/2017   Spinal stenosis     Past Surgical History:  Procedure Laterality Date   APPENDECTOMY  2000   CAROTID PTA/STENT INTERVENTION N/A 09/08/2023   Procedure: CAROTID PTA/STENT INTERVENTION;  Surgeon: Marea Selinda RAMAN, MD;  Location: [facility] INVASIVE CV LAB;  Service: Cardiovascular;  Laterality: N/A;   COLONOSCOPY WITH PROPOFOL  N/A 09/06/2019   Procedure: COLONOSCOPY WITH PROPOFOL ;  Surgeon: Therisa Bi, MD;  Location: [facility] ENDOSCOPY;  Service: Gastroenterology;  Laterality: N/A;    CYSTOSCOPY WITH INSERTION OF UROLIFT N/A 01/17/2018   Procedure: CYSTOSCOPY WITH INSERTION OF UROLIFT;  Surgeon: Twylla Glendia BROCKS, MD;  Location: [facility] ORS;  Service: Urology;  Laterality: N/A;   CYSTOSCOPY WITH INSERTION OF UROLIFT  01/2018   CYSTOSCOPY WITH INSERTION OF UROLIFT Bilateral    HERNIA REPAIR Left 2000   double inguinal hernia repairs   HYDROCELE EXCISION Left 10/06/2021   Procedure: HYDROCELECTOMY ADULT GROIN/UNILATERAL SCROTAL APPROACH;  Surgeon: Twylla Glendia BROCKS, MD;  Location: [facility] ORS;  Service: Urology;  Laterality: Left;   LEFT HEART CATH AND CORONARY ANGIOGRAPHY N/A 09/19/2023   Procedure: LEFT HEART CATH AND CORONARY ANGIOGRAPHY;  Surgeon: Darron Deatrice LABOR, MD;  Location: [facility] INVASIVE CV LAB;  Service: Cardiovascular;  Laterality: N/A;   LUMBAR LAMINECTOMY     laminectomy x 2. 1990, 1994   NASAL SEPTUM SURGERY  1978   repair of sinuses also   TESTICULAR TORSION REPAIR  2005   TONSILLECTOMY  1970    Current Medications: Current Meds  Medication Sig   acetaminophen  (TYLENOL ) 500 MG tablet Take 500 mg by mouth every 4 (four) hours as needed for moderate pain or headache.    aspirin  EC 81 MG tablet Take 81 mg by mouth daily. Swallow whole.   calcium  carbonate (TUMS - DOSED IN MG ELEMENTAL CALCIUM ) 500 MG chewable tablet Chew 1 tablet by mouth 2 (two) times daily as needed for indigestion or heartburn.   Cholecalciferol  (VITAMIN D3) 50 MCG (2000 UT) TABS Take 1 tablet by  mouth daily at 6 (six) AM.   clopidogrel  (PLAVIX ) 75 MG tablet Take 1 tablet (75 mg total) by mouth daily.   cyanocobalamin  2000 MCG tablet Take 2,000 mcg by mouth daily.   L-THEANINE PO Take 200 mg by mouth daily as needed (stress).   Magnesium  250 MG TABS Take 250 mg by mouth daily as needed.   montelukast  (SINGULAIR ) 10 MG tablet Take 1 tablet (10 mg total) by mouth at bedtime.   Multiple Vitamin (MULTIVITAMIN WITH MINERALS) TABS tablet Take 1 tablet by mouth daily.   naproxen (NAPROSYN) 250 MG  tablet Take 250 mg by mouth as needed.   nitroGLYCERIN  (NITROSTAT ) 0.4 MG SL tablet Place 1 tablet (0.4 mg total) under the tongue every 5 (five) minutes as needed for chest pain.   Omega-3 Fatty Acids (FISH OIL) 1000 MG CAPS Take 1,000 mg by mouth daily.   pantoprazole  (PROTONIX ) 40 MG tablet Take 1 tablet (40 mg total) by mouth daily.   rosuvastatin  (CRESTOR ) 40 MG tablet Take 1 tablet (40 mg total) by mouth every morning.   spironolactone  (ALDACTONE ) 25 MG tablet Take 0.5 tablets (12.5 mg total) by mouth daily.   tadalafil (CIALIS) 20 MG tablet Take 20 mg by mouth daily as needed.   [DISCONTINUED] lisinopril  (ZESTRIL ) 5 MG tablet Take 1 tablet (5 mg total) by mouth 2 (two) times daily.     Allergies:   Atorvastatin , Prednisone, Verapamil , Metoprolol , Seasonal ic [cholestatin], and Sulfa  antibiotics   Social History   Socioeconomic History   Marital status: Married    Spouse name: Juan Salas   Number of children: 2   Years of education: Not on file   Highest education level: Master's degree (e.g., MA, MS, MEng, MEd, MSW, MBA)  Occupational History   Occupation: Engineer, Mining: [employer]  Tobacco Use   Smoking status: Never   Smokeless tobacco: Never  Vaping Use   Vaping status: Never Used  Substance and Sexual Activity   Alcohol use: Not Currently   Drug use: Never   Sexual activity: Not Currently  Other Topics Concern   Not on file  Social History Narrative   Non-smoker   No ETOH   No drugs   Married (Juan Salas)   Exercises at least 3x/week   Theatre Manager (Retired)      [employer]   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   Social Drivers of Corporate Investment Banker Strain: Low Risk  (12/08/2023)   Overall Financial Resource Strain (CARDIA)    Difficulty of Paying Living Expenses: Not very hard  Food Insecurity: No Food Insecurity (12/08/2023)   Hunger Vital Sign    Worried About Running Out of Food in the Last Year: Never true     Ran Out of Food in the Last Year: Never true  Transportation Needs: No Transportation Needs (12/08/2023)   PRAPARE - Administrator, Civil Service (Medical): No    Lack of Transportation (Non-Medical): No  Physical Activity: Sufficiently Active (12/08/2023)   Exercise Vital Sign    Days of Exercise per Week: 4 days    Minutes of Exercise per Session: 40 min  Stress: No Stress Concern Present (12/08/2023)   [employer] of Occupational Health - Occupational Stress Questionnaire    Feeling of Stress: Only a little  Social Connections: Moderately Isolated (12/08/2023)   Social Connection and Isolation Panel    Frequency of Communication with Friends and Family: Twice a  week    Frequency of Social Gatherings with Friends and Family: Once a week    Attends Religious Services: Never    Diplomatic Services Operational Officer: No    Attends Engineer, Structural: Not on file    Marital Status: Married     Family History: The patient's family history includes Arthritis in his maternal grandmother, mother, and paternal grandfather; Hearing loss in his maternal grandfather, paternal grandfather, and paternal grandmother; Hyperlipidemia in his father, maternal grandfather, maternal grandmother, mother, and paternal grandfather; Hypertension in his father, maternal grandfather, maternal grandmother, mother, and paternal grandfather; Kidney disease in his maternal grandfather; Miscarriages / Stillbirths in his mother; Prostatitis in his father; Stroke in his maternal grandfather, maternal grandmother, mother, and paternal grandfather; Varicose Veins in his mother.  ROS:   Please see the history of present illness.     All other systems reviewed and are negative.  EKGs/Labs/Other Studies Reviewed:    The following studies were reviewed today:       Recent Labs: 08/26/2023: TSH 1.881 12/12/2023: ALT 30 01/07/2024: BUN 11; Creatinine, Ser 1.17; Hemoglobin 14.9; Platelets  242; Potassium 4.3; Sodium 137  Recent Lipid Panel    Component Value Date/Time   CHOL 168 12/12/2023 0848   CHOL 192 07/01/2020 0923   TRIG 124.0 12/12/2023 0848   HDL 60.20 12/12/2023 0848   HDL 74 07/01/2020 0923   CHOLHDL 3 12/12/2023 0848   VLDL 24.8 12/12/2023 0848   LDLCALC 83 12/12/2023 0848   LDLCALC 105 (H) 07/01/2020 0923     Risk Assessment/Calculations:            Physical Exam:    VS:  BP 115/78 (BP Location: Left Arm, Patient Position: Sitting, Cuff Size: Normal)   Pulse 82   Ht 6' (1.829 m)   Wt 190 lb 3.2 oz (86.3 kg)   SpO2 99%   BMI 25.80 kg/m     Wt Readings from Last 3 Encounters:  03/19/24 190 lb 3.2 oz (86.3 kg)  01/17/24 187 lb 9.6 oz (85.1 kg)  01/12/24 189 lb (85.7 kg)     GEN:  Well nourished, well developed in no acute distress HEENT: Normal NECK: No JVD; No carotid bruits CARDIAC: RRR, no murmurs, rubs, gallops RESPIRATORY:  Clear to auscultation without rales, wheezing or rhonchi  ABDOMEN: Soft, non-tender, non-distended MUSCULOSKELETAL:  No edema; No deformity  SKIN: Warm and dry NEUROLOGIC:  Alert and oriented x 3 PSYCHIATRIC:  Normal affect   ASSESSMENT:    1. Dizziness   2. Primary hypertension   3. Coronary artery disease involving native coronary artery of native heart without angina pectoris   4. Mixed hyperlipidemia      PLAN:    In order of problems listed above:  Dizziness associated with spikes in BP, cardiac monitor showed 6 episodes of nonsustained SVT, longest lasted 19 seconds.  Echo 5/25 EF 60 to 65%, no significant structural abnormalities. Episodic spikes in BP, renal arterial ultrasound showed no significant stenosis.  Aldosterone renin activity showed normal aldosterone levels ruling out primary or secondary aldosteronism.  BP now controlled/stable.  Continue Aldactone  12.5 mg daily, reduce lisinopril  to 5 mg daily. Nonobstructive CAD 30% left main, 40% diagonal.  Echo 5/25 EF 60 to 65%.  Continue aspirin   81 mg daily, Crestor  40 mg daily.  Also takes Plavix  due to carotid disease. Hyperlipidemia, Continue Crestor  40 mg daily.  Follow-up in 6 months.     Medication Adjustments/Labs and Tests Ordered: Current  medicines are reviewed at length with the patient today.  Concerns regarding medicines are outlined above.  No orders of the defined types were placed in this encounter.  Meds ordered this encounter  Medications   lisinopril  (ZESTRIL ) 5 MG tablet    Sig: Take 1 tablet (5 mg total) by mouth daily.    Patient Instructions  Medication Instructions:  - DECREASE lisinopril  to 5 mg once daily   *If you need a refill on your cardiac medications before your next appointment, please call your pharmacy*  Lab Work: No labs ordered today  If you have labs (blood work) drawn today and your tests are completely normal, you will receive your results only by: MyChart Message (if you have MyChart) OR A paper copy in the mail If you have any lab test that is abnormal or we need to change your treatment, we will call you to review the results.  Testing/Procedures: No test ordered today   Follow-Up: At [facility], you and your health needs are our priority.  As part of our continuing mission to provide you with exceptional heart care, our providers are all part of one team.  This team includes your primary Cardiologist (physician) and Advanced Practice Providers or APPs (Physician Assistants and Nurse Practitioners) who all work together to provide you with the care you need, when you need it.  Your next appointment:   6 month(s)  Provider:   You may see Redell Cave, MD or one of the following Advanced Practice Providers on your designated Care Team:   Lonni Meager, NP Lesley Maffucci, PA-C Bernardino Bring, PA-C Cadence Grant, PA-C Tylene Lunch, NP Barnie Hila, NP    We recommend signing up for the patient portal called MyChart.  Sign up information is provided on  this After Visit Summary.  MyChart is used to connect with patients for Virtual Visits (Telemedicine).  Patients are able to view lab/test results, encounter notes, upcoming appointments, etc.  Non-urgent messages can be sent to your provider as well.   To learn more about what you can do with MyChart, go to [email or web address].             Signed, Redell Cave, MD  03/19/2024 10:47 AM    [facility]

## 2024-03-19 NOTE — Patient Instructions (Signed)
 Medication Instructions:  - DECREASE lisinopril  to 5 mg once daily   *If you need a refill on your cardiac medications before your next appointment, please call your pharmacy*  Lab Work: No labs ordered today  If you have labs (blood work) drawn today and your tests are completely normal, you will receive your results only by: MyChart Message (if you have MyChart) OR A paper copy in the mail If you have any lab test that is abnormal or we need to change your treatment, we will call you to review the results.  Testing/Procedures: No test ordered today   Follow-Up: At [facility], you and your health needs are our priority.  As part of our continuing mission to provide you with exceptional heart care, our providers are all part of one team.  This team includes your primary Cardiologist (physician) and Advanced Practice Providers or APPs (Physician Assistants and Nurse Practitioners) who all work together to provide you with the care you need, when you need it.  Your next appointment:   6 month(s)  Provider:   You may see Redell Cave, MD or one of the following Advanced Practice Providers on your designated Care Team:   Lonni Meager, NP Lesley Maffucci, PA-C Bernardino Bring, PA-C Cadence [place], PA-C Tylene Lunch, NP Barnie Hila, NP    We recommend signing up for the patient portal called MyChart.  Sign up information is provided on this After Visit Summary.  MyChart is used to connect with patients for Virtual Visits (Telemedicine).  Patients are able to view lab/test results, encounter notes, upcoming appointments, etc.  Non-urgent messages can be sent to your provider as well.   To learn more about what you can do with MyChart, go to [email or web address].

## 2024-04-13 ENCOUNTER — Encounter: Payer: Self-pay | Admitting: Family Medicine

## 2024-04-17 NOTE — Progress Notes (Unsigned)
 "    Juan Sybert T. Danil Wedge, MD, CAQ [facility] [address] [place], [address]  Phone: [phone]  FAX: [phone]  EMMITTE SURGEON - 63 y.o. male  MRN [id number]  Date of Birth: 07-29-1960  Date: 04/18/2024  PCP: Watt Mirza, MD  Referral: Watt Mirza, MD  No chief complaint on file.  Subjective:   Juan Salas is a 63 y.o. very pleasant male patient with There is no height or weight on file to calculate BMI. who presents with the following:  Discussed the use of AI scribe software for clinical note transcription with the patient, who gave verbal consent to proceed.  Juan Salas is here for possible diverticulitis. History of Present Illness     Review of Systems is noted in the HPI, as appropriate  Objective:   There were no vitals taken for this visit.  GEN: No acute distress; alert,appropriate. PULM: Breathing comfortably in no respiratory distress PSYCH: Normally interactive.   Laboratory and Imaging Data:  Assessment and Plan:   No diagnosis found. Assessment & Plan   Medication Management during today's office visit: No orders of the defined types were placed in this encounter.  There are no discontinued medications.  Orders placed today for conditions managed today: No orders of the defined types were placed in this encounter.   Disposition: No follow-ups on file.  Dragon [employer] One speech-to-text software was used for transcription in this dictation.  Possible transcriptional errors can occur using Animal nutritionist.   Signed,  Mirza DASEN. Sheina Mcleish, MD   Outpatient Encounter Medications as of 04/18/2024  Medication Sig   acetaminophen  (TYLENOL ) 500 MG tablet Take 500 mg by mouth every 4 (four) hours as needed for moderate pain or headache.    aspirin  EC 81 MG tablet Take 81 mg by mouth daily. Swallow whole.   calcium  carbonate (TUMS - DOSED IN MG ELEMENTAL CALCIUM ) 500 MG  chewable tablet Chew 1 tablet by mouth 2 (two) times daily as needed for indigestion or heartburn.   Cholecalciferol  (VITAMIN D3) 50 MCG (2000 UT) TABS Take 1 tablet by mouth daily at 6 (six) AM.   clopidogrel  (PLAVIX ) 75 MG tablet Take 1 tablet (75 mg total) by mouth daily.   cyanocobalamin  2000 MCG tablet Take 2,000 mcg by mouth daily.   L-THEANINE PO Take 200 mg by mouth daily as needed (stress).   lisinopril  (ZESTRIL ) 5 MG tablet Take 1 tablet (5 mg total) by mouth daily.   Magnesium  250 MG TABS Take 250 mg by mouth daily as needed.   montelukast  (SINGULAIR ) 10 MG tablet Take 1 tablet (10 mg total) by mouth at bedtime.   Multiple Vitamin (MULTIVITAMIN WITH MINERALS) TABS tablet Take 1 tablet by mouth daily.   naproxen (NAPROSYN) 250 MG tablet Take 250 mg by mouth as needed.   nitroGLYCERIN  (NITROSTAT ) 0.4 MG SL tablet Place 1 tablet (0.4 mg total) under the tongue every 5 (five) minutes as needed for chest pain.   Omega-3 Fatty Acids (FISH OIL) 1000 MG CAPS Take 1,000 mg by mouth daily.   pantoprazole  (PROTONIX ) 40 MG tablet Take 1 tablet (40 mg total) by mouth daily.   rosuvastatin  (CRESTOR ) 40 MG tablet Take 1 tablet (40 mg total) by mouth every morning.   spironolactone  (ALDACTONE ) 25 MG tablet Take 0.5 tablets (12.5 mg total) by mouth daily.   tadalafil (CIALIS) 20 MG tablet Take 20 mg by mouth daily as needed.   No facility-administered encounter medications  on file as of 04/18/2024.   "

## 2024-04-18 ENCOUNTER — Ambulatory Visit: Payer: Self-pay | Admitting: Family Medicine

## 2024-04-18 ENCOUNTER — Ambulatory Visit
Admission: RE | Admit: 2024-04-18 | Discharge: 2024-04-18 | Disposition: A | Discharge status: Home / Self Care | Source: Ambulatory Visit | Attending: Family Medicine | Admitting: Family Medicine

## 2024-04-18 ENCOUNTER — Ambulatory Visit: Admitting: Family Medicine

## 2024-04-18 ENCOUNTER — Encounter: Payer: Self-pay | Admitting: Family Medicine

## 2024-04-18 VITALS — BP 120/70 | HR 57 | Temp 98.4°F | Ht 70.5 in | Wt 191.4 lb

## 2024-04-18 DIAGNOSIS — R1032 Left lower quadrant pain: Secondary | ICD-10-CM

## 2024-04-18 DIAGNOSIS — K5792 Diverticulitis of intestine, part unspecified, without perforation or abscess without bleeding: Secondary | ICD-10-CM

## 2024-04-18 MED ORDER — IOHEXOL 9 MG/ML PO SOLN
500.0000 mL | ORAL | Status: AC
  Administered 2024-04-18 (×2): 500 mL via ORAL

## 2024-04-18 MED ORDER — AMOXICILLIN-POT CLAVULANATE 875-125 MG PO TABS: 1.0000 | ORAL_TABLET | Freq: Two times a day (BID) | ORAL | 0 refills | Status: DC

## 2024-04-18 MED ORDER — TADALAFIL 20 MG PO TABS: 20.0000 mg | ORAL_TABLET | Freq: Every day | ORAL | 11 refills | Status: AC | PRN

## 2024-04-18 MED ORDER — IOHEXOL 300 MG/ML  SOLN
100.0000 mL | Freq: Once | INTRAMUSCULAR | Status: AC | PRN
  Administered 2024-04-18: 100 mL via INTRAVENOUS

## 2024-05-23 ENCOUNTER — Ambulatory Visit: Admitting: Family Medicine

## 2024-05-23 ENCOUNTER — Encounter: Payer: Self-pay | Admitting: Family Medicine

## 2024-05-23 VITALS — BP 140/82 | HR 81 | Temp 98.3°F | Ht 70.5 in | Wt 195.2 lb

## 2024-05-23 DIAGNOSIS — R21 Rash and other nonspecific skin eruption: Secondary | ICD-10-CM | POA: Diagnosis not present

## 2024-05-23 NOTE — Patient Instructions (Addendum)
 Terbinafine  cream: once a day until it clears, then continue for 1 week

## 2024-07-06 ENCOUNTER — Ambulatory Visit (INDEPENDENT_AMBULATORY_CARE_PROVIDER_SITE_OTHER): Admitting: Nurse Practitioner

## 2024-07-06 ENCOUNTER — Encounter (INDEPENDENT_AMBULATORY_CARE_PROVIDER_SITE_OTHER)

## 2025-01-03 ENCOUNTER — Ambulatory Visit
